# Patient Record
Sex: Female | Born: 1970
Health system: Southern US, Community
[De-identification: ages and names within clinical notes are randomized; demographics above are authoritative.]

## PROBLEM LIST (undated history)

## (undated) ENCOUNTER — Inpatient Hospital Stay (HOSPITAL_COMMUNITY): Payer: Self-pay

## (undated) DIAGNOSIS — K219 Gastro-esophageal reflux disease without esophagitis: Secondary | ICD-10-CM

## (undated) DIAGNOSIS — M722 Plantar fascial fibromatosis: Secondary | ICD-10-CM

## (undated) DIAGNOSIS — M2550 Pain in unspecified joint: Secondary | ICD-10-CM

## (undated) DIAGNOSIS — R7303 Prediabetes: Secondary | ICD-10-CM

## (undated) DIAGNOSIS — M549 Dorsalgia, unspecified: Secondary | ICD-10-CM

## (undated) DIAGNOSIS — N979 Female infertility, unspecified: Secondary | ICD-10-CM

## (undated) DIAGNOSIS — E079 Disorder of thyroid, unspecified: Secondary | ICD-10-CM

## (undated) HISTORY — DX: Disorder of thyroid, unspecified: E07.9

## (undated) HISTORY — DX: Prediabetes: R73.03

## (undated) HISTORY — DX: Female infertility, unspecified: N97.9

## (undated) HISTORY — DX: Plantar fascial fibromatosis: M72.2

## (undated) HISTORY — DX: Pain in unspecified joint: M25.50

## (undated) HISTORY — DX: Dorsalgia, unspecified: M54.9

## (undated) HISTORY — DX: Gastro-esophageal reflux disease without esophagitis: K21.9

---

## 2004-03-18 HISTORY — PX: KNEE ARTHROSCOPY: SUR90

## 2005-11-28 ENCOUNTER — Other Ambulatory Visit: Admission: RE | Admit: 2005-11-28 | Discharge: 2005-11-28 | Payer: Self-pay | Admitting: Obstetrics and Gynecology

## 2010-03-18 HISTORY — PX: HYSTEROSCOPY: SHX211

## 2010-03-18 HISTORY — PX: OTHER SURGICAL HISTORY: SHX169

## 2011-03-19 NOTE — L&D Delivery Note (Signed)
Delivery Note  SVD viable female Apgars 9,9 over 2nd degree ML epis.  Placenta delivered spontaneously intact with 3VC. Repair with 2-0 Chromic with good support and hemostasis noted and R/V exam confirms.  PH art was sent.  [place] cord blood was not available.  Mother and baby were doing well.  EBL 300cc  Candice Camp, MD

## 2011-03-20 LAB — OB RESULTS CONSOLE RUBELLA ANTIBODY, IGM: Rubella: IMMUNE

## 2011-03-20 LAB — OB RESULTS CONSOLE HEPATITIS B SURFACE ANTIGEN: Hepatitis B Surface Ag: NEGATIVE

## 2011-07-18 ENCOUNTER — Other Ambulatory Visit: Payer: Self-pay

## 2011-07-29 LAB — OB RESULTS CONSOLE ABO/RH: RH Type: NEGATIVE

## 2011-07-29 LAB — OB RESULTS CONSOLE ANTIBODY SCREEN: Antibody Screen: NEGATIVE

## 2011-07-29 LAB — OB RESULTS CONSOLE RPR: RPR: NONREACTIVE

## 2011-08-18 ENCOUNTER — Other Ambulatory Visit: Payer: Self-pay | Admitting: Obstetrics & Gynecology

## 2011-09-17 ENCOUNTER — Other Ambulatory Visit (HOSPITAL_COMMUNITY): Payer: Self-pay | Admitting: Obstetrics and Gynecology

## 2011-09-17 DIAGNOSIS — IMO0002 Reserved for concepts with insufficient information to code with codable children: Secondary | ICD-10-CM

## 2011-09-17 DIAGNOSIS — O09529 Supervision of elderly multigravida, unspecified trimester: Secondary | ICD-10-CM

## 2011-09-17 DIAGNOSIS — Z0489 Encounter for examination and observation for other specified reasons: Secondary | ICD-10-CM

## 2011-09-18 ENCOUNTER — Encounter (HOSPITAL_COMMUNITY): Payer: Self-pay

## 2011-09-18 ENCOUNTER — Ambulatory Visit (HOSPITAL_COMMUNITY)
Admission: RE | Admit: 2011-09-18 | Discharge: 2011-09-18 | Disposition: A | Discharge status: Home / Self Care | Payer: 59 | Source: Ambulatory Visit | Attending: Obstetrics and Gynecology | Admitting: Obstetrics and Gynecology

## 2011-09-18 VITALS — BP 115/76 | HR 90 | Wt 226.0 lb

## 2011-09-18 DIAGNOSIS — Z363 Encounter for antenatal screening for malformations: Secondary | ICD-10-CM | POA: Insufficient documentation

## 2011-09-18 DIAGNOSIS — O09519 Supervision of elderly primigravida, unspecified trimester: Secondary | ICD-10-CM | POA: Insufficient documentation

## 2011-09-18 DIAGNOSIS — IMO0002 Reserved for concepts with insufficient information to code with codable children: Secondary | ICD-10-CM

## 2011-09-18 DIAGNOSIS — O352XX0 Maternal care for (suspected) hereditary disease in fetus, not applicable or unspecified: Secondary | ICD-10-CM | POA: Insufficient documentation

## 2011-09-18 DIAGNOSIS — Z1389 Encounter for screening for other disorder: Secondary | ICD-10-CM | POA: Insufficient documentation

## 2011-09-18 DIAGNOSIS — O3440 Maternal care for other abnormalities of cervix, unspecified trimester: Secondary | ICD-10-CM | POA: Insufficient documentation

## 2011-09-18 DIAGNOSIS — O09529 Supervision of elderly multigravida, unspecified trimester: Secondary | ICD-10-CM

## 2011-09-18 DIAGNOSIS — Z0489 Encounter for examination and observation for other specified reasons: Secondary | ICD-10-CM

## 2011-09-18 DIAGNOSIS — O358XX0 Maternal care for other (suspected) fetal abnormality and damage, not applicable or unspecified: Secondary | ICD-10-CM | POA: Insufficient documentation

## 2011-09-18 NOTE — Progress Notes (Signed)
Patient seen today  for ultrasound appointment.  See full report in [facility].  Alpha Gula, MD  Single IUP at 17 4/7 weeks Normal detailed anatomic fetal survey; however, somewhat limited views of the fetal heart were obtained.   Normal amniotic fluid volume A marginal placenta cord insertion noted No markers associated with aneuploidy were seen  Recommend follow up ultrasound in 4 weeks for growth and to reevaluate the fetal heart.

## 2011-09-19 ENCOUNTER — Encounter (HOSPITAL_COMMUNITY): Payer: Self-pay

## 2011-09-19 ENCOUNTER — Inpatient Hospital Stay (HOSPITAL_COMMUNITY): Payer: 59

## 2011-09-19 ENCOUNTER — Inpatient Hospital Stay (HOSPITAL_COMMUNITY)
Admission: AD | Admit: 2011-09-19 | Discharge: 2011-09-19 | Disposition: A | Discharge status: Home / Self Care | Payer: 59 | Source: Ambulatory Visit | Attending: Obstetrics and Gynecology | Admitting: Obstetrics and Gynecology

## 2011-09-19 DIAGNOSIS — O209 Hemorrhage in early pregnancy, unspecified: Secondary | ICD-10-CM | POA: Insufficient documentation

## 2011-09-19 NOTE — MAU Provider Note (Signed)
  History     CSN: [id number]  Arrival date and time: 09/19/11 1954   None     Chief Complaint  Patient presents with  . Vaginal Bleeding  . Abdominal Pain   HPI This 41 year old G1 P0 at 17 weeks and 5 days who [facility] a [facility] who presents to the [facility]U with vaginal bleeding that occurred earlier this afternoon. The bleeding saturating 4-5 wads of toilet paper. The bleeding slightly improved with lying down for an hour. There are no provoking factors. She denies recent sexual activity, abdominal trauma, leaking fluid, vaginal discharge.  She has not felt fetal activity yet.  OB History    Grav Para Term Preterm Abortions TAB SAB Ect Mult Living   1    0  0         History reviewed. No pertinent past medical history.  Past Surgical History  Procedure Date  . Polpys   . Ivf      History reviewed. No pertinent family history.  History  Substance Use Topics  . Smoking status: Never Smoker   . Smokeless tobacco: Not on file  . Alcohol Use: No    Allergies: No Known Allergies  Prescriptions prior to admission  Medication Sig Dispense Refill  . Prenatal Vit-Fe Fumarate-FA (PRENATAL MULTIVITAMIN) TABS Take 1 tablet by mouth daily.        Review of Systems  All other systems reviewed and are negative.   Physical Exam   Blood pressure 121/71, pulse 72, temperature 98.2 F (36.8 C), temperature source Oral, resp. rate 18, height 5' 10.5" (1.791 m), weight 105.291 kg (232 lb 2 oz), last menstrual period 05/18/2011.  Physical Exam  Constitutional: She is oriented to person, place, and time. She appears well-developed and well-nourished.  GI: Soft. She exhibits no distension and no mass. There is tenderness (mild RLQ). There is no rebound and no guarding.  Neurological: She is alert and oriented to person, place, and time.  Skin: Skin is warm and dry.  Psychiatric: She has a normal mood and affect. Her behavior is normal. Judgment and thought content  normal.   LIMITED OBSTETRIC ULTRASOUND  Number of Fetuses: 1  Heart Rate: 156 bpm  Movement: Yes  Presentation: Cephalic  Placental Location: Posterior  Previa: No  Amniotic Fluid (Subjective): Normal  Vertical pocket: 5.4cm  MATERNAL FINDINGS:  Cervix: Closed. 4.5 cm in length.  Uterus/Adnexae: Within normal limits.  IMPRESSION:  17-week Intrauterine pregnancy with fetal heart rate 156 beats per  minute. Fetal measurements were deferred due to recent full OB  ultrasound performed 09/18/2011. Please refer to this study for  detail measurements and anatomy. No evidence of subchorionic  hemorrhage.  Recommend followup with non-emergent complete OB 14+ wk US  examination for fetal biometric evaluation and anatomic survey if  not already performed.  Original Report Authenticated By: Cyndie Chime, M.D.  MAU Course  Procedures  MDM  Assessment and Plan  1.  Vaginal Bleeding 2.  IUP at 17.5 weeks  No evidence of abruption or subchorionic hemorrhage.  Discussed pt with Dr Vincente Poli.  Patient discharged to home to follow up next week with [facility].  Patient instructed to return if symptoms worsen.  STINSON, JACOB JEHIEL 09/19/2011, 8:36 PM

## 2011-09-19 NOTE — MAU Note (Signed)
Patient is in with c/o sudden onset (1800) of bright red vaginal bleeding and mild abdominal cramping. She states that this is her 2nd ivf attempt, at [facility] she was told that everything looks good. She is tearful and worried that she may be having a miscarriage.

## 2011-10-18 ENCOUNTER — Ambulatory Visit (HOSPITAL_COMMUNITY)
Admission: RE | Admit: 2011-10-18 | Discharge: 2011-10-18 | Disposition: A | Discharge status: Home / Self Care | Payer: 59 | Source: Ambulatory Visit | Attending: Obstetrics and Gynecology | Admitting: Obstetrics and Gynecology

## 2011-10-18 DIAGNOSIS — Z0489 Encounter for examination and observation for other specified reasons: Secondary | ICD-10-CM

## 2011-10-18 DIAGNOSIS — O3440 Maternal care for other abnormalities of cervix, unspecified trimester: Secondary | ICD-10-CM | POA: Insufficient documentation

## 2011-10-18 DIAGNOSIS — O09519 Supervision of elderly primigravida, unspecified trimester: Secondary | ICD-10-CM | POA: Insufficient documentation

## 2011-10-18 DIAGNOSIS — O09529 Supervision of elderly multigravida, unspecified trimester: Secondary | ICD-10-CM

## 2011-10-18 DIAGNOSIS — O352XX0 Maternal care for (suspected) hereditary disease in fetus, not applicable or unspecified: Secondary | ICD-10-CM | POA: Insufficient documentation

## 2011-10-18 DIAGNOSIS — IMO0002 Reserved for concepts with insufficient information to code with codable children: Secondary | ICD-10-CM

## 2011-12-18 ENCOUNTER — Other Ambulatory Visit (HOSPITAL_COMMUNITY): Payer: Self-pay | Admitting: Obstetrics and Gynecology

## 2011-12-18 DIAGNOSIS — R1011 Right upper quadrant pain: Secondary | ICD-10-CM

## 2011-12-19 ENCOUNTER — Ambulatory Visit (HOSPITAL_COMMUNITY)
Admission: RE | Admit: 2011-12-19 | Discharge: 2011-12-19 | Disposition: A | Discharge status: Home / Self Care | Payer: 59 | Source: Ambulatory Visit | Attending: Obstetrics and Gynecology | Admitting: Obstetrics and Gynecology

## 2011-12-19 DIAGNOSIS — R1011 Right upper quadrant pain: Secondary | ICD-10-CM | POA: Insufficient documentation

## 2012-01-10 ENCOUNTER — Encounter (HOSPITAL_COMMUNITY): Payer: Self-pay | Admitting: *Deleted

## 2012-01-10 ENCOUNTER — Inpatient Hospital Stay (HOSPITAL_COMMUNITY): Payer: 59

## 2012-01-10 ENCOUNTER — Observation Stay (HOSPITAL_COMMUNITY)
Admission: AD | Admit: 2012-01-10 | Discharge: 2012-01-11 | DRG: 781 | Disposition: A | Discharge status: Home / Self Care | Payer: 59 | Source: Ambulatory Visit | Attending: Obstetrics and Gynecology | Admitting: Obstetrics and Gynecology

## 2012-01-10 DIAGNOSIS — W108XXA Fall (on) (from) other stairs and steps, initial encounter: Secondary | ICD-10-CM | POA: Diagnosis present

## 2012-01-10 DIAGNOSIS — O368390 Maternal care for abnormalities of the fetal heart rate or rhythm, unspecified trimester, not applicable or unspecified: Secondary | ICD-10-CM | POA: Diagnosis present

## 2012-01-10 DIAGNOSIS — O99891 Other specified diseases and conditions complicating pregnancy: Principal | ICD-10-CM | POA: Diagnosis present

## 2012-01-10 MED ORDER — ACETAMINOPHEN 325 MG PO TABS: 650.0000 mg | ORAL_TABLET | ORAL | Status: DC | PRN

## 2012-01-10 MED ORDER — DOCUSATE SODIUM 100 MG PO CAPS: 100.0000 mg | ORAL_CAPSULE | Freq: Every day | ORAL | Status: DC

## 2012-01-10 MED ORDER — ZOLPIDEM TARTRATE 5 MG PO TABS: 5.0000 mg | ORAL_TABLET | Freq: Every evening | ORAL | Status: DC | PRN

## 2012-01-10 MED ORDER — NIFEDIPINE 10 MG PO CAPS
10.0000 mg | ORAL_CAPSULE | Freq: Once | ORAL | Status: AC
  Administered 2012-01-10: 10 mg via ORAL
  Filled 2012-01-10: qty 1

## 2012-01-10 MED ORDER — PRENATAL MULTIVITAMIN CH: 1.0000 | ORAL_TABLET | Freq: Every day | ORAL | Status: DC

## 2012-01-10 MED ORDER — CALCIUM CARBONATE ANTACID 500 MG PO CHEW: 2.0000 | CHEWABLE_TABLET | ORAL | Status: DC | PRN

## 2012-01-10 NOTE — MAU Provider Note (Signed)
  History     CSN: [id number]  Arrival date and time: 01/10/12 1722   None     Chief Complaint  Patient presents with  . Fall   HPI  Pt is [redacted]w[redacted]d pregnant and fell down 3 steps on her abdomen.    No past medical history on file.  Past Surgical History  Procedure Date  . Polpys   . Ivf      No family history on file.  History  Substance Use Topics  . Smoking status: Never Smoker   . Smokeless tobacco: Not on file  . Alcohol Use: No    Allergies: No Known Allergies  Prescriptions prior to admission  Medication Sig Dispense Refill  . Prenatal Vit-Fe Fumarate-FA (PRENATAL MULTIVITAMIN) TABS Take 1 tablet by mouth daily.        ROS Physical Exam   Last menstrual period 05/18/2011.  Physical Exam  MAU Course  Procedures Dr. Renaldo Fiddler notified Care transferred to [facility], CNM  Assessment and Plan    LINEBERRY,SUSAN 01/10/2012, 5:40 PM   FHR 130 moderate variability, 15x15 accels, no decels Toco: Q2-4, min, mild  No evidence of abruption per [place].  23 hour Obs for contractions, abd trauma.  [place], CNM 01/10/2012 10:44 PM

## 2012-01-10 NOTE — Progress Notes (Signed)
Rn notified Dr. Renaldo Fiddler that patient was here after falling on ABD at home. RN notified MD of moderate variability , 15X15 accelerations, with no contractions. Orders given. Dr. Renaldo Fiddler requested that she be called after [place] and patient has been on fetal monitor for an hour.

## 2012-01-10 NOTE — MAU Note (Signed)
Fell at home, from 3 steps onto abdomen on brick flooring. At approximately 5:15.

## 2012-01-11 NOTE — Progress Notes (Signed)
Pt reports no ctx, vb or lof.  + Fm  AF, VSS FHT reassuring w/ accels Toco - occasional Gen - NAD Abd - gravid, NT  A/P:  Plan d/c home FU this week

## 2012-01-11 NOTE — H&P (Signed)
Chief Complaint   Patient presents with   .  Fall    HPI  Pt is [redacted]w[redacted]d pregnant and fell down 3 steps on her abdomen.  No past medical history on file.  Past Surgical History   Procedure  Date   .  Polpys    .  Ivf     No family history on file.  History   Substance Use Topics   .  Smoking status:  Never Smoker   .  Smokeless tobacco:  Not on file   .  Alcohol Use:  No    Allergies: No Known Allergies  Prescriptions prior to admission   Medication  Sig  Dispense  Refill   .  Prenatal Vit-Fe Fumarate-FA (PRENATAL MULTIVITAMIN) TABS  Take 1 tablet by mouth daily.      ROS  Physical Exam   Last menstrual period 05/18/2011.  Physical Exam  MAU Course   Procedures  Dr. Renaldo Fiddler notified  Care transferred to [facility], CNM   Pt returned from [place] where BPP 8/8 and AFI 14.  Placenta looked wnl.  Having some mild ctx Plan for procardia 10mg  po and overnight obs with fetal monitoring

## 2012-01-14 NOTE — Discharge Summary (Signed)
Obstetric Discharge Summary Reason for Admission: observation/evaluation Prenatal Procedures: ultrasound Intrapartum Procedures: ultrasound Postpartum Procedures: na Complications-Operative and Postpartum: na No results found for this basename: hgb, hct    Physical Exam:  General: alert and cooperative Lochia: na Uterine Fundus: gravid,soft Incision: na DVT Evaluation: No evidence of DVT seen on physical exam.  Discharge Diagnoses: 33 weeks, s/p fall  Discharge Information: Date: 01/14/2012 Activity: pelvic rest Diet: routine Medications: PNV Condition: stable Instructions: call for uterine contractions, decreased fetal movement, LOF of VB Discharge to: home Follow-up Information    Schedule an appointment as soon as possible for a visit in 1 week to follow up.         Newborn Data: This patient has no babies on file. Home with na.  Cassie [name] 01/14/2012, 8:26 AM

## 2012-01-15 NOTE — Progress Notes (Signed)
Post ur discharge review completed for dates of 01-10-12 through 01-11-12.

## 2012-02-22 ENCOUNTER — Encounter (HOSPITAL_COMMUNITY): Payer: Self-pay | Admitting: Anesthesiology

## 2012-02-22 ENCOUNTER — Inpatient Hospital Stay (HOSPITAL_COMMUNITY): Payer: 59 | Admitting: Anesthesiology

## 2012-02-22 ENCOUNTER — Encounter (HOSPITAL_COMMUNITY): Payer: Self-pay | Admitting: *Deleted

## 2012-02-22 ENCOUNTER — Inpatient Hospital Stay (HOSPITAL_COMMUNITY)
Admission: AD | Admit: 2012-02-22 | Discharge: 2012-02-24 | DRG: 775 | Disposition: A | Discharge status: Home / Self Care | Payer: 59 | Source: Ambulatory Visit | Attending: Obstetrics and Gynecology | Admitting: Obstetrics and Gynecology

## 2012-02-22 DIAGNOSIS — O4290 Premature rupture of membranes, unspecified as to length of time between rupture and onset of labor, unspecified weeks of gestation: Secondary | ICD-10-CM | POA: Diagnosis present

## 2012-02-22 DIAGNOSIS — O09519 Supervision of elderly primigravida, unspecified trimester: Secondary | ICD-10-CM | POA: Diagnosis present

## 2012-02-22 LAB — CBC
HCT: 32.8 % — ABNORMAL LOW (ref 36.0–46.0)
Hemoglobin: 11.2 g/dL — ABNORMAL LOW (ref 12.0–15.0)
MCH: 32.3 pg (ref 26.0–34.0)
RBC: 3.47 MIL/uL — ABNORMAL LOW (ref 3.87–5.11)

## 2012-02-22 LAB — RPR: RPR Ser Ql: NONREACTIVE

## 2012-02-22 MED ORDER — LIDOCAINE HCL (PF) 1 % IJ SOLN
INTRAMUSCULAR | Status: DC | PRN
  Administered 2012-02-22: 5 mL
  Administered 2012-02-22: 4 mL

## 2012-02-22 MED ORDER — OXYTOCIN 40 UNITS IN LACTATED RINGERS INFUSION - SIMPLE MED: 1.0000 m[IU]/min | INTRAVENOUS | Status: DC

## 2012-02-22 MED ORDER — WITCH HAZEL-GLYCERIN EX PADS: 1.0000 "application " | MEDICATED_PAD | CUTANEOUS | Status: DC | PRN

## 2012-02-22 MED ORDER — PHENYLEPHRINE 40 MCG/ML (10ML) SYRINGE FOR IV PUSH (FOR BLOOD PRESSURE SUPPORT)
80.0000 ug | PREFILLED_SYRINGE | INTRAVENOUS | Status: DC | PRN
  Filled 2012-02-22: qty 5

## 2012-02-22 MED ORDER — LIDOCAINE HCL (PF) 1 % IJ SOLN
30.0000 mL | INTRAMUSCULAR | Status: DC | PRN
  Filled 2012-02-22: qty 30

## 2012-02-22 MED ORDER — LACTATED RINGERS IV SOLN
INTRAVENOUS | Status: DC
  Administered 2012-02-22: 125 mL/h via INTRAVENOUS
  Administered 2012-02-22 (×2): via INTRAVENOUS

## 2012-02-22 MED ORDER — IBUPROFEN 600 MG PO TABS
600.0000 mg | ORAL_TABLET | Freq: Four times a day (QID) | ORAL | Status: DC | PRN
  Administered 2012-02-22: 600 mg via ORAL
  Filled 2012-02-22: qty 1

## 2012-02-22 MED ORDER — FENTANYL 2.5 MCG/ML BUPIVACAINE 1/10 % EPIDURAL INFUSION (WH - ANES)
INTRAMUSCULAR | Status: DC | PRN
  Administered 2012-02-22: 15 mL/h via EPIDURAL

## 2012-02-22 MED ORDER — DIPHENHYDRAMINE HCL 25 MG PO CAPS: 25.0000 mg | ORAL_CAPSULE | Freq: Four times a day (QID) | ORAL | Status: DC | PRN

## 2012-02-22 MED ORDER — SENNOSIDES-DOCUSATE SODIUM 8.6-50 MG PO TABS
2.0000 | ORAL_TABLET | Freq: Every day | ORAL | Status: DC
  Administered 2012-02-23: 2 via ORAL

## 2012-02-22 MED ORDER — DIPHENHYDRAMINE HCL 50 MG/ML IJ SOLN: 12.5000 mg | INTRAMUSCULAR | Status: DC | PRN

## 2012-02-22 MED ORDER — SIMETHICONE 80 MG PO CHEW
80.0000 mg | CHEWABLE_TABLET | ORAL | Status: DC | PRN
Start: 2012-02-22 — End: 2012-02-24

## 2012-02-22 MED ORDER — IBUPROFEN 600 MG PO TABS
600.0000 mg | ORAL_TABLET | Freq: Four times a day (QID) | ORAL | Status: DC
  Administered 2012-02-23 – 2012-02-24 (×5): 600 mg via ORAL
  Filled 2012-02-22 (×5): qty 1

## 2012-02-22 MED ORDER — OXYCODONE-ACETAMINOPHEN 5-325 MG PO TABS: 1.0000 | ORAL_TABLET | ORAL | Status: DC | PRN

## 2012-02-22 MED ORDER — CITRIC ACID-SODIUM CITRATE 334-500 MG/5ML PO SOLN
30.0000 mL | ORAL | Status: DC | PRN
  Administered 2012-02-22: 30 mL via ORAL
  Filled 2012-02-22: qty 15

## 2012-02-22 MED ORDER — TETANUS-DIPHTH-ACELL PERTUSSIS 5-2.5-18.5 LF-MCG/0.5 IM SUSP
0.5000 mL | Freq: Once | INTRAMUSCULAR | Status: DC
Start: 2012-02-23 — End: 2012-02-23

## 2012-02-22 MED ORDER — BENZOCAINE-MENTHOL 20-0.5 % EX AERO
1.0000 "application " | INHALATION_SPRAY | CUTANEOUS | Status: DC | PRN
  Filled 2012-02-22: qty 56

## 2012-02-22 MED ORDER — EPHEDRINE 5 MG/ML INJ: 10.0000 mg | INTRAVENOUS | Status: DC | PRN

## 2012-02-22 MED ORDER — EPHEDRINE 5 MG/ML INJ
10.0000 mg | INTRAVENOUS | Status: DC | PRN
  Filled 2012-02-22: qty 4

## 2012-02-22 MED ORDER — MEASLES, MUMPS & RUBELLA VAC ~~LOC~~ INJ
0.5000 mL | INJECTION | Freq: Once | SUBCUTANEOUS | Status: DC
  Filled 2012-02-22: qty 0.5

## 2012-02-22 MED ORDER — OXYTOCIN 40 UNITS IN LACTATED RINGERS INFUSION - SIMPLE MED
1.0000 m[IU]/min | INTRAVENOUS | Status: DC
  Administered 2012-02-22: 2 m[IU]/min via INTRAVENOUS

## 2012-02-22 MED ORDER — PRENATAL MULTIVITAMIN CH
1.0000 | ORAL_TABLET | Freq: Every day | ORAL | Status: DC
  Filled 2012-02-22 (×2): qty 1

## 2012-02-22 MED ORDER — OXYTOCIN BOLUS FROM INFUSION
500.0000 mL | INTRAVENOUS | Status: DC
  Administered 2012-02-22: 500 mL via INTRAVENOUS

## 2012-02-22 MED ORDER — LANOLIN HYDROUS EX OINT: TOPICAL_OINTMENT | CUTANEOUS | Status: DC | PRN

## 2012-02-22 MED ORDER — PRENATAL MULTIVITAMIN CH: 1.0000 | ORAL_TABLET | Freq: Every day | ORAL | Status: DC

## 2012-02-22 MED ORDER — LACTATED RINGERS IV SOLN
500.0000 mL | Freq: Once | INTRAVENOUS | Status: AC
  Administered 2012-02-22: 500 mL via INTRAVENOUS

## 2012-02-22 MED ORDER — PHENYLEPHRINE 40 MCG/ML (10ML) SYRINGE FOR IV PUSH (FOR BLOOD PRESSURE SUPPORT): 80.0000 ug | PREFILLED_SYRINGE | INTRAVENOUS | Status: DC | PRN

## 2012-02-22 MED ORDER — FLEET ENEMA 7-19 GM/118ML RE ENEM: 1.0000 | ENEMA | RECTAL | Status: DC | PRN

## 2012-02-22 MED ORDER — LACTATED RINGERS IV SOLN
500.0000 mL | INTRAVENOUS | Status: DC | PRN
  Administered 2012-02-22 (×3): 500 mL via INTRAVENOUS

## 2012-02-22 MED ORDER — OXYTOCIN 40 UNITS IN LACTATED RINGERS INFUSION - SIMPLE MED
62.5000 mL/h | INTRAVENOUS | Status: DC
  Administered 2012-02-22: 62.5 mL/h via INTRAVENOUS
  Filled 2012-02-22: qty 1000

## 2012-02-22 MED ORDER — ONDANSETRON HCL 4 MG/2ML IJ SOLN: 4.0000 mg | Freq: Four times a day (QID) | INTRAMUSCULAR | Status: DC | PRN

## 2012-02-22 MED ORDER — ZOLPIDEM TARTRATE 5 MG PO TABS: 5.0000 mg | ORAL_TABLET | Freq: Every evening | ORAL | Status: DC | PRN

## 2012-02-22 MED ORDER — FENTANYL 2.5 MCG/ML BUPIVACAINE 1/10 % EPIDURAL INFUSION (WH - ANES)
14.0000 mL/h | INTRAMUSCULAR | Status: DC
  Administered 2012-02-22: 15 mL/h via EPIDURAL
  Filled 2012-02-22 (×2): qty 125

## 2012-02-22 MED ORDER — ONDANSETRON HCL 4 MG/2ML IJ SOLN: 4.0000 mg | INTRAMUSCULAR | Status: DC | PRN

## 2012-02-22 MED ORDER — TERBUTALINE SULFATE 1 MG/ML IJ SOLN: 0.2500 mg | Freq: Once | INTRAMUSCULAR | Status: DC | PRN

## 2012-02-22 MED ORDER — ACETAMINOPHEN 325 MG PO TABS: 650.0000 mg | ORAL_TABLET | ORAL | Status: DC | PRN

## 2012-02-22 MED ORDER — ONDANSETRON HCL 4 MG PO TABS: 4.0000 mg | ORAL_TABLET | ORAL | Status: DC | PRN

## 2012-02-22 MED ORDER — DIBUCAINE 1 % RE OINT: 1.0000 "application " | TOPICAL_OINTMENT | RECTAL | Status: DC | PRN

## 2012-02-22 MED ORDER — MEDROXYPROGESTERONE ACETATE 150 MG/ML IM SUSP: 150.0000 mg | INTRAMUSCULAR | Status: DC | PRN

## 2012-02-22 NOTE — H&P (Signed)
Cassie [name] is a 41 y.o. female presenting for srom at midnight clear fluid.  GBS-.  IVF pregnancy with normal AMA workup and no complications. History OB History    Grav Para Term Preterm Abortions TAB SAB Ect Mult Living   1    0  0        History reviewed. No pertinent past medical history. Past Surgical History  Procedure Date  . Polpys   . Ivf    . Abdominal hysterectomy    Family History: family history is not on file. Social History:  reports that she has never smoked. She does not have any smokeless tobacco history on file. She reports that she does not drink alcohol or use illicit drugs.   Prenatal Transfer Tool  Maternal Diabetes: No Genetic Screening: Normal Maternal Ultrasounds/Referrals: Normal Fetal Ultrasounds or other Referrals:  None Maternal Substance Abuse:  No Significant Maternal Medications:  None Significant Maternal Lab Results:  None Other Comments:  None  ROS  Dilation: Fingertip Effacement (%): Thick Station: -3 Exam by:: [email or web address]-[email or web address], rnc Blood pressure 122/70, pulse 63, temperature 98.4 F (36.9 C), temperature source Oral, resp. rate 18, height 5\' 11"  (1.803 m), weight 110.678 kg (244 lb), last menstrual period 05/18/2011, SpO2 98.00%. Exam Physical Exam  Prenatal labs: ABO, Rh: O/Negative/-- (05/13 0000) Antibody: Negative (05/13 0000) Rubella: Immune (01/02 0000) RPR: NON REACTIVE (12/07 0116)  HBsAg: Negative (01/02 0000)  HIV: Non-reactive (05/13 0000)  GBS: Negative (11/08 0000)   Assessment/Plan: IUP at term PROM/SROM- Augmentation with pitocin.  Pt with epidural  Anticipate SVD   Cassie [name] C 02/22/2012, 8:41 AM

## 2012-02-22 NOTE — Anesthesia Preprocedure Evaluation (Addendum)
Anesthesia Evaluation  Patient identified by MRN, date of birth, ID band Patient awake    Reviewed: Allergy & Precautions, H&P , NPO status , Patient's Chart, lab work & pertinent test results  Airway Mallampati: III TM Distance: >3 FB Neck ROM: full    Dental No notable dental hx. (+) Teeth Intact   Pulmonary neg pulmonary ROS,    Pulmonary exam normal       Cardiovascular negative cardio ROS  Rhythm:regular     Neuro/Psych negative neurological ROS  negative psych ROS   GI/Hepatic negative GI ROS, Neg liver ROS,   Endo/Other  negative endocrine ROS  Renal/GU negative Renal ROS  negative genitourinary   Musculoskeletal negative musculoskeletal ROS (+)   Abdominal Normal abdominal exam  (+)   Peds  Hematology negative hematology ROS (+)   Anesthesia Other Findings   Reproductive/Obstetrics (+) Pregnancy                           Anesthesia Physical Anesthesia Plan  ASA: II  Anesthesia Plan: Epidural   Post-op Pain Management:    Induction:   Airway Management Planned:   Additional Equipment:   Intra-op Plan:   Post-operative Plan:   Informed Consent:   Plan Discussed with: Anesthesiologist, CRNA and Surgeon  Anesthesia Plan Comments:        Anesthesia Quick Evaluation

## 2012-02-22 NOTE — MAU Note (Signed)
Think my water broke at 2315. Some contractions

## 2012-02-22 NOTE — Progress Notes (Signed)
RN updated Dr. Rana Snare at this time. Notified him of minimal variability, late, prolonged, and variable decels as well as recent vaginal exam.

## 2012-02-22 NOTE — Progress Notes (Signed)
Report called to [facility] in [place]. To 164 ambulatory

## 2012-02-22 NOTE — Anesthesia Procedure Notes (Signed)
Epidural Patient location during procedure: OB Start time: 02/22/2012 8:09 AM  Staffing Anesthesiologist: Ayomide Purdy A. Performed by: anesthesiologist   Preanesthetic Checklist Completed: patient identified, site marked, surgical consent, pre-op evaluation, timeout performed, IV checked, risks and benefits discussed and monitors and equipment checked  Epidural Patient position: sitting Prep: site prepped and draped and DuraPrep Patient monitoring: continuous pulse ox and blood pressure Approach: midline Injection technique: LOR air  Needle:  Needle type: Tuohy  Needle gauge: 17 G Needle length: 9 cm and 9 Needle insertion depth: 7 cm Catheter type: closed end flexible Catheter size: 19 Gauge Catheter at skin depth: 12 cm Test dose: negative and Other  Assessment Events: blood not aspirated, injection not painful, no injection resistance, negative IV test and no paresthesia  Additional Notes Patient identified. Risks and benefits discussed including failed block, incomplete  Pain control, post dural puncture headache, nerve damage, paralysis, blood pressure Changes, nausea, vomiting, reactions to medications-both toxic and allergic and post Partum back pain. All questions were answered. Patient expressed understanding and wished to proceed. Sterile technique was used throughout procedure. Epidural site was Dressed with sterile barrier dressing. No paresthesias, signs of intravascular injection Or signs of intrathecal spread were encountered.  Patient was more comfortable after the epidural was dosed. Please see RN's note for documentation of vital signs and FHR which are stable.

## 2012-02-23 LAB — CBC
HCT: 32 % — ABNORMAL LOW (ref 36.0–46.0)
Hemoglobin: 10.7 g/dL — ABNORMAL LOW (ref 12.0–15.0)
MCH: 31.7 pg (ref 26.0–34.0)
MCHC: 33.4 g/dL (ref 30.0–36.0)
MCV: 94.7 fL (ref 78.0–100.0)
RDW: 14.2 % (ref 11.5–15.5)

## 2012-02-23 MED ORDER — RHO D IMMUNE GLOBULIN 1500 UNIT/2ML IJ SOLN
300.0000 ug | Freq: Once | INTRAMUSCULAR | Status: AC
  Administered 2012-02-23: 300 ug via INTRAMUSCULAR
  Filled 2012-02-23: qty 2

## 2012-02-23 NOTE — Anesthesia Postprocedure Evaluation (Signed)
  Anesthesia Post-op Note  Patient: Cassie [name]  Procedure(s) Performed: * No procedures listed *  Patient Location: Mother/Baby  Anesthesia Type:Epidural  Level of Consciousness: awake, alert  and oriented  Airway and Oxygen Therapy: Patient Spontanous Breathing  Post-op Pain: none  Post-op Assessment: Post-op Vital signs reviewed, Patient's Cardiovascular Status Stable, Respiratory Function Stable, Patent Airway, No signs of Nausea or vomiting, Pain level controlled, No headache, No backache and No residual motor weakness. She has some numbness over the distribution of the right lateral femoral cutaneous nerve and some mild numbness over the superior aspect of left knee.  Post-op Vital Signs: Reviewed and stable  Complications: No apparent anesthesia complications

## 2012-02-23 NOTE — Progress Notes (Signed)
Post Partum Day 1 Subjective: no complaints, up ad lib, voiding and tolerating PO  Objective: Blood pressure 116/71, pulse 60, temperature 98 F (36.7 C), temperature source Oral, resp. rate 20, height 5\' 11"  (1.803 m), weight 110.678 kg (244 lb), last menstrual period 05/18/2011, SpO2 96.00%, unknown if currently breastfeeding.  Physical Exam:  General: alert, cooperative, appears older than stated age and no distress Lochia: appropriate Uterine Fundus: firm Incision: healing well DVT Evaluation: No evidence of DVT seen on physical exam.   Basename 02/23/12 0515 02/22/12 0116  HGB 10.7* 11.2*  HCT 32.0* 32.8*    Assessment/Plan: Plan for discharge tomorrow and Breastfeeding Some right lateral thigh numbness but no other complaints.  If it persists then Anesthesia consult    LOS: 1 day   York Valliant C 02/23/2012, 10:43 AM

## 2012-02-24 LAB — RH IG WORKUP (INCLUDES ABO/RH): Unit division: 0

## 2012-02-24 NOTE — Discharge Summary (Signed)
Obstetric Discharge Summary Reason for Admission: rupture of membranes Prenatal Procedures: ultrasound Intrapartum Procedures: spontaneous vaginal delivery Postpartum Procedures: none Complications-Operative and Postpartum: 2 degree perineal laceration Hemoglobin  Date Value Range Status  02/23/2012 10.7* 12.0 - 15.0 g/dL Final     HCT  Date Value Range Status  02/23/2012 32.0* 36.0 - 46.0 % Final    Physical Exam:  General: alert and cooperative Lochia: appropriate Uterine Fundus: firm Incision: perineum intact DVT Evaluation: No evidence of DVT seen on physical exam. Negative Homan's sign. No cords or calf tenderness. R lateral thigh numbness resolving, no difficulty with ambulation.   Discharge Diagnoses: Term Pregnancy-delivered  Discharge Information: Date: 02/24/2012 Activity: pelvic rest Diet: routine Medications: PNV and Ibuprofen Condition: stable Instructions: refer to practice specific booklet Discharge to: home   Newborn Data: Live born female  Birth Weight: 7 lb 9.2 oz (3436 g) APGAR: 9, 9  Home with mother.  Cassie [name] G 02/24/2012, 8:22 AM

## 2012-02-25 NOTE — Addendum Note (Signed)
Addendum  created 02/25/12 0801 by Dana Allan, MD   Modules edited:Charting, Inpatient Notes

## 2012-05-26 ENCOUNTER — Other Ambulatory Visit: Payer: Self-pay | Admitting: Obstetrics and Gynecology

## 2013-11-15 ENCOUNTER — Other Ambulatory Visit (INDEPENDENT_AMBULATORY_CARE_PROVIDER_SITE_OTHER): Payer: 59

## 2013-11-15 ENCOUNTER — Other Ambulatory Visit: Payer: Self-pay | Admitting: Obstetrics and Gynecology

## 2013-11-15 DIAGNOSIS — K921 Melena: Secondary | ICD-10-CM

## 2013-11-15 DIAGNOSIS — Z0000 Encounter for general adult medical examination without abnormal findings: Secondary | ICD-10-CM

## 2013-11-15 LAB — COMPREHENSIVE METABOLIC PANEL
ALBUMIN: 4 g/dL (ref 3.5–5.2)
ALT: 13 U/L (ref 0–35)
AST: 14 U/L (ref 0–37)
Alkaline Phosphatase: 78 U/L (ref 39–117)
BUN: 11 mg/dL (ref 6–23)
CALCIUM: 9.1 mg/dL (ref 8.4–10.5)
CHLORIDE: 102 meq/L (ref 96–112)
CO2: 27 mEq/L (ref 19–32)
CREATININE: 0.74 mg/dL (ref 0.50–1.10)
GLUCOSE: 93 mg/dL (ref 70–99)
POTASSIUM: 4 meq/L (ref 3.5–5.3)
Sodium: 137 mEq/L (ref 135–145)
Total Bilirubin: 0.5 mg/dL (ref 0.2–1.2)
Total Protein: 6.7 g/dL (ref 6.0–8.3)

## 2013-11-15 LAB — LIPID PANEL
CHOLESTEROL: 135 mg/dL (ref 0–200)
HDL: 42 mg/dL (ref >39)
LDL CALC: 81 mg/dL (ref 0–99)
TRIGLYCERIDES: 59 mg/dL (ref <150)
Total CHOL/HDL Ratio: 3.2 Ratio
VLDL: 12 mg/dL (ref 0–40)

## 2013-11-15 LAB — TSH: TSH: 0.7 u[IU]/mL (ref 0.350–4.500)

## 2013-11-16 LAB — CBC WITH DIFFERENTIAL/PLATELET
BASOS ABS: 0.1 10*3/uL (ref 0.0–0.1)
BASOS PCT: 1 % (ref 0–1)
EOS PCT: 1 % (ref 0–5)
Eosinophils Absolute: 0.1 10*3/uL (ref 0.0–0.7)
HEMATOCRIT: 40.5 % (ref 36.0–46.0)
HEMOGLOBIN: 13.5 g/dL (ref 12.0–15.0)
LYMPHS PCT: 39 % (ref 12–46)
Lymphs Abs: 2.8 10*3/uL (ref 0.7–4.0)
MCH: 30.8 pg (ref 26.0–34.0)
MCHC: 33.3 g/dL (ref 30.0–36.0)
MCV: 92.5 fL (ref 78.0–100.0)
MONO ABS: 0.4 10*3/uL (ref 0.1–1.0)
MONOS PCT: 6 % (ref 3–12)
Neutro Abs: 3.8 10*3/uL (ref 1.7–7.7)
Neutrophils Relative %: 53 % (ref 43–77)
Platelets: 272 10*3/uL (ref 150–400)
RBC: 4.38 MIL/uL (ref 3.87–5.11)
RDW: 13.9 % (ref 11.5–15.5)
WBC: 7.2 10*3/uL (ref 4.0–10.5)

## 2014-01-17 ENCOUNTER — Encounter (HOSPITAL_COMMUNITY): Payer: Self-pay | Admitting: *Deleted

## 2014-02-07 ENCOUNTER — Other Ambulatory Visit: Payer: Self-pay | Admitting: Certified Nurse Midwife

## 2014-02-07 LAB — CBC WITH DIFFERENTIAL/PLATELET
BASOS ABS: 0 10*3/uL (ref 0.0–0.1)
BASOS PCT: 0 % (ref 0–1)
EOS ABS: 0 10*3/uL (ref 0.0–0.7)
EOS PCT: 0 % (ref 0–5)
HEMATOCRIT: 38.6 % (ref 36.0–46.0)
HEMOGLOBIN: 13.3 g/dL (ref 12.0–15.0)
Lymphocytes Relative: 25 % (ref 12–46)
Lymphs Abs: 2.5 10*3/uL (ref 0.7–4.0)
MCH: 32 pg (ref 26.0–34.0)
MCHC: 34.5 g/dL (ref 30.0–36.0)
MCV: 92.8 fL (ref 78.0–100.0)
MONO ABS: 0.7 10*3/uL (ref 0.1–1.0)
MONOS PCT: 7 % (ref 3–12)
NEUTROS ABS: 6.7 10*3/uL (ref 1.7–7.7)
Neutrophils Relative %: 68 % (ref 43–77)
Platelets: 225 10*3/uL (ref 150–400)
RBC: 4.16 MIL/uL (ref 3.87–5.11)
RDW: 13 % (ref 11.5–15.5)
WBC: 9.9 10*3/uL (ref 4.0–10.5)

## 2014-02-09 ENCOUNTER — Other Ambulatory Visit: Payer: Self-pay | Admitting: Internal Medicine

## 2014-02-09 ENCOUNTER — Other Ambulatory Visit: Payer: Self-pay | Admitting: *Deleted

## 2014-02-09 DIAGNOSIS — E041 Nontoxic single thyroid nodule: Secondary | ICD-10-CM

## 2014-02-15 ENCOUNTER — Other Ambulatory Visit (HOSPITAL_COMMUNITY)
Admission: RE | Admit: 2014-02-15 | Discharge: 2014-02-15 | Disposition: A | Discharge status: Home / Self Care | Payer: 59 | Source: Ambulatory Visit | Attending: Interventional Radiology | Admitting: Interventional Radiology

## 2014-02-15 ENCOUNTER — Ambulatory Visit
Admission: RE | Admit: 2014-02-15 | Discharge: 2014-02-15 | Disposition: A | Discharge status: Home / Self Care | Payer: 59 | Source: Ambulatory Visit | Attending: Internal Medicine | Admitting: Internal Medicine

## 2014-02-15 DIAGNOSIS — E041 Nontoxic single thyroid nodule: Secondary | ICD-10-CM

## 2014-05-17 ENCOUNTER — Other Ambulatory Visit: Payer: Self-pay | Admitting: Internal Medicine

## 2014-05-17 DIAGNOSIS — E049 Nontoxic goiter, unspecified: Secondary | ICD-10-CM

## 2014-06-29 ENCOUNTER — Encounter: Payer: Self-pay | Admitting: Certified Nurse Midwife

## 2014-06-29 ENCOUNTER — Ambulatory Visit (INDEPENDENT_AMBULATORY_CARE_PROVIDER_SITE_OTHER): Payer: 59 | Admitting: Certified Nurse Midwife

## 2014-06-29 VITALS — BP 108/72 | HR 72 | Resp 16 | Ht 69.75 in

## 2014-06-29 DIAGNOSIS — N946 Dysmenorrhea, unspecified: Secondary | ICD-10-CM

## 2014-06-29 DIAGNOSIS — Z0000 Encounter for general adult medical examination without abnormal findings: Secondary | ICD-10-CM

## 2014-06-29 DIAGNOSIS — Z01419 Encounter for gynecological examination (general) (routine) without abnormal findings: Secondary | ICD-10-CM | POA: Diagnosis not present

## 2014-06-29 DIAGNOSIS — Z124 Encounter for screening for malignant neoplasm of cervix: Secondary | ICD-10-CM | POA: Diagnosis not present

## 2014-06-29 DIAGNOSIS — N920 Excessive and frequent menstruation with regular cycle: Secondary | ICD-10-CM | POA: Diagnosis not present

## 2014-06-29 LAB — THYROID PANEL WITH TSH
FREE THYROXINE INDEX: 1.6 (ref 1.4–3.8)
T3 UPTAKE: 30 % (ref 22–35)
T4, Total: 5.3 ug/dL (ref 4.5–12.0)
TSH: 0.552 u[IU]/mL (ref 0.350–4.500)

## 2014-06-29 LAB — COMPREHENSIVE METABOLIC PANEL
ALBUMIN: 3.8 g/dL (ref 3.5–5.2)
ALK PHOS: 67 U/L (ref 39–117)
ALT: 10 U/L (ref 0–35)
AST: 14 U/L (ref 0–37)
BILIRUBIN TOTAL: 0.7 mg/dL (ref 0.2–1.2)
BUN: 11 mg/dL (ref 6–23)
CALCIUM: 8.8 mg/dL (ref 8.4–10.5)
CO2: 23 mEq/L (ref 19–32)
Chloride: 105 mEq/L (ref 96–112)
Creat: 0.6 mg/dL (ref 0.50–1.10)
GLUCOSE: 93 mg/dL (ref 70–99)
Potassium: 4.1 mEq/L (ref 3.5–5.3)
SODIUM: 138 meq/L (ref 135–145)
TOTAL PROTEIN: 6.7 g/dL (ref 6.0–8.3)

## 2014-06-29 LAB — CBC WITH DIFFERENTIAL/PLATELET
BASOS PCT: 0 % (ref 0–1)
Basophils Absolute: 0 10*3/uL (ref 0.0–0.1)
EOS ABS: 0.1 10*3/uL (ref 0.0–0.7)
EOS PCT: 1 % (ref 0–5)
HCT: 39.2 % (ref 36.0–46.0)
Hemoglobin: 13.3 g/dL (ref 12.0–15.0)
Lymphocytes Relative: 34 % (ref 12–46)
Lymphs Abs: 2.9 10*3/uL (ref 0.7–4.0)
MCH: 31.3 pg (ref 26.0–34.0)
MCHC: 33.9 g/dL (ref 30.0–36.0)
MCV: 92.2 fL (ref 78.0–100.0)
MPV: 10.6 fL (ref 8.6–12.4)
Monocytes Absolute: 0.7 10*3/uL (ref 0.1–1.0)
Monocytes Relative: 8 % (ref 3–12)
NEUTROS PCT: 57 % (ref 43–77)
Neutro Abs: 4.9 10*3/uL (ref 1.7–7.7)
Platelets: 260 10*3/uL (ref 150–400)
RBC: 4.25 MIL/uL (ref 3.87–5.11)
RDW: 13.9 % (ref 11.5–15.5)
WBC: 8.6 10*3/uL (ref 4.0–10.5)

## 2014-06-29 LAB — LIPID PANEL
CHOL/HDL RATIO: 3.2 ratio
Cholesterol: 154 mg/dL (ref 0–200)
HDL: 48 mg/dL (ref >=46)
LDL Cholesterol: 93 mg/dL (ref 0–99)
TRIGLYCERIDES: 65 mg/dL (ref <150)
VLDL: 13 mg/dL (ref 0–40)

## 2014-06-29 LAB — HEMOGLOBIN A1C
HEMOGLOBIN A1C: 5.8 % — AB (ref <5.7)
Mean Plasma Glucose: 120 mg/dL — ABNORMAL HIGH (ref <117)

## 2014-06-29 LAB — POCT URINALYSIS DIPSTICK
Bilirubin, UA: NEGATIVE
GLUCOSE UA: NEGATIVE
Ketones, UA: NEGATIVE
Leukocytes, UA: NEGATIVE
NITRITE UA: NEGATIVE
PH UA: 5
PROTEIN UA: NEGATIVE
RBC UA: NEGATIVE
UROBILINOGEN UA: NEGATIVE

## 2014-06-29 MED ORDER — TRANEXAMIC ACID 650 MG PO TABS: 1300.0000 mg | ORAL_TABLET | Freq: Three times a day (TID) | ORAL | Status: DC

## 2014-06-29 NOTE — Patient Instructions (Signed)
EXERCISE AND DIET:  We recommended that you start or continue a regular exercise program for good health. Regular exercise means any activity that makes your heart beat faster and makes you sweat.  We recommend exercising at least 30 minutes per day at least 3 days a week, preferably 4 or 5.  We also recommend a diet low in fat and sugar.  Inactivity, poor dietary choices and obesity can cause diabetes, heart attack, stroke, and kidney damage, among others.    ALCOHOL AND SMOKING:  Women should limit their alcohol intake to no more than 7 drinks/beers/glasses of wine (combined, not each!) per week. Moderation of alcohol intake to this level decreases your risk of breast cancer and liver damage. And of course, no recreational drugs are part of a healthy lifestyle.  And absolutely no smoking or even second hand smoke. Most people know smoking can cause heart and lung diseases, but did you know it also contributes to weakening of your bones? Aging of your skin?  Yellowing of your teeth and nails?  CALCIUM AND VITAMIN D:  Adequate intake of calcium and Vitamin D are recommended.  The recommendations for exact amounts of these supplements seem to change often, but generally speaking 600 mg of calcium (either carbonate or citrate) and 800 units of Vitamin D per day seems prudent. Certain women may benefit from higher intake of Vitamin D.  If you are among these women, your doctor will have told you during your visit.    PAP SMEARS:  Pap smears, to check for cervical cancer or precancers,  have traditionally been done yearly, although recent scientific advances have shown that most women can have pap smears less often.  However, every woman still should have a physical exam from her gynecologist every year. It will include a breast check, inspection of the vulva and vagina to check for abnormal growths or skin changes, a visual exam of the cervix, and then an exam to evaluate the size and shape of the uterus and  ovaries.  And after 43 years of age, a rectal exam is indicated to check for rectal cancers. We will also provide age appropriate advice regarding health maintenance, like when you should have certain vaccines, screening for sexually transmitted diseases, bone density testing, colonoscopy, mammograms, etc.   MAMMOGRAMS:  All women over 40 years old should have a yearly mammogram. Many facilities now offer a "3D" mammogram, which may cost around $50 extra out of pocket. If possible,  we recommend you accept the option to have the 3D mammogram performed.  It both reduces the number of women who will be called back for extra views which then turn out to be normal, and it is better than the routine mammogram at detecting truly abnormal areas.    COLONOSCOPY:  Colonoscopy to screen for colon cancer is recommended for all women at age 50.  We know, you hate the idea of the prep.  We agree, BUT, having colon cancer and not knowing it is worse!!  Colon cancer so often starts as a polyp that can be seen and removed at colonscopy, which can quite literally save your life!  And if your first colonoscopy is normal and you have no family history of colon cancer, most women don't have to have it again for 10 years.  Once every ten years, you can do something that may end up saving your life, right?  We will be happy to help you get it scheduled when you are ready.    Be sure to check your insurance coverage so you understand how much it will cost.  It may be covered as a preventative service at no cost, but you should check your particular policy.     Dysmenorrhea Menstrual cramps (dysmenorrhea) are caused by the muscles of the uterus tightening (contracting) during a menstrual period. For some women, this discomfort is merely bothersome. For others, dysmenorrhea can be severe enough to interfere with everyday activities for a few days each month. Primary dysmenorrhea is menstrual cramps that last a couple of days when you  start having menstrual periods or soon after. This often begins after a teenager starts having her period. As a woman gets older or has a baby, the cramps will usually lessen or disappear. Secondary dysmenorrhea begins later in life, lasts longer, and the pain may be stronger than primary dysmenorrhea. The pain may start before the period and last a few days after the period.  CAUSES  Dysmenorrhea is usually caused by an underlying problem, such as:  The tissue lining the uterus grows outside of the uterus in other areas of the body (endometriosis).  The endometrial tissue, which normally lines the uterus, is found in or grows into the muscular walls of the uterus (adenomyosis).  The pelvic blood vessels are engorged with blood just before the menstrual period (pelvic congestive syndrome).  Overgrowth of cells (polyps) in the lining of the uterus or cervix.  Falling down of the uterus (prolapse) because of loose or stretched ligaments.  Depression.  Bladder problems, infection, or inflammation.  Problems with the intestine, a tumor, or irritable bowel syndrome.  Cancer of the female organs or bladder.  A severely tipped uterus.  A very tight opening or closed cervix.  Noncancerous tumors of the uterus (fibroids).  Pelvic inflammatory disease (PID).  Pelvic scarring (adhesions) from a previous surgery.  Ovarian cyst.  An intrauterine device (IUD) used for birth control. RISK FACTORS You may be at greater risk of dysmenorrhea if:  You are younger than age 24.  You started puberty early.  You have irregular or heavy bleeding.  You have never given birth.  You have a family history of this problem.  You are a smoker. SIGNS AND SYMPTOMS   Cramping or throbbing pain in your lower abdomen.  Headaches.  Lower back pain.  Nausea or vomiting.  Diarrhea.  Sweating or dizziness.  Loose stools. DIAGNOSIS  A diagnosis is based on your history, symptoms, physical  exam, diagnostic tests, or procedures. Diagnostic tests or procedures may include:  Blood tests.  Ultrasonography.  An examination of the lining of the uterus (dilation and curettage, D&C).  An examination inside your abdomen or pelvis with a scope (laparoscopy).  X-rays.  CT scan.  MRI.  An examination inside the bladder with a scope (cystoscopy).  An examination inside the intestine or stomach with a scope (colonoscopy, gastroscopy). TREATMENT  Treatment depends on the cause of the dysmenorrhea. Treatment may include:  Pain medicine prescribed by your health care provider.  Birth control pills or an IUD with progesterone hormone in it.  Hormone replacement therapy.  Nonsteroidal anti-inflammatory drugs (NSAIDs). These may help stop the production of prostaglandins.  Surgery to remove adhesions, endometriosis, ovarian cyst, or fibroids.  Removal of the uterus (hysterectomy).  Progesterone shots to stop the menstrual period.  Cutting the nerves on the sacrum that go to the female organs (presacral neurectomy).  Electric current to the sacral nerves (sacral nerve stimulation).  Antidepressant medicine.  Psychiatric therapy,  counseling, or group therapy.  Exercise and physical therapy.  Meditation and yoga therapy.  Acupuncture. HOME CARE INSTRUCTIONS   Only take over-the-counter or prescription medicines as directed by your health care provider.  Place a heating pad or hot water bottle on your lower back or abdomen. Do not sleep with the heating pad.  Use aerobic exercises, walking, swimming, biking, and other exercises to help lessen the cramping.  Massage to the lower back or abdomen may help.  Stop smoking.  Avoid alcohol and caffeine. SEEK MEDICAL CARE IF:   Your pain does not get better with medicine.  You have pain with sexual intercourse.  Your pain increases and is not controlled with medicines.  You have abnormal vaginal bleeding with your  period.  You develop nausea or vomiting with your period that is not controlled with medicine. SEEK IMMEDIATE MEDICAL CARE IF:  You pass out.  Document Released: 03/04/2005 Document Revised: 11/04/2012 Document Reviewed: 08/20/2012 [facility] Patient Information 2015 [place], [place]. This information is not intended to replace advice given to you by your health care provider. Make sure you discuss any questions you have with your health care provider.

## 2014-06-29 NOTE — Progress Notes (Addendum)
44 y.o. G20P1001 Married  Caucasian Fe here for annual exam. Periods are normal, regular  with menorrhagia, and dysmenorrhea . Considering Mirena management for cycle control.  Sees Dr [name] Antis as PCP and  for follow up with right thyroid nodule. Previous biopsy negative. Patient feels size has reduced. 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 Emotionally OK. Has been working on weight loss, but plans to work on [employer] with MD monitoring. No health issues today, but needs fasting labs.  No LMP recorded.          Sexually active: Yes.    The current method of family planning is none.    Exercising: No.  exercise Smoker:  no  Health Maintenance: Pap:  1/14 normal per patient MMG:  12/14 Colonoscopy:  none BMD:   none TDaP:   07/2011 Labs: Poct urine-neg,  Self breast exam:   reports that she has never smoked. She has never used smokeless tobacco. She reports that she does not drink alcohol or use illicit drugs.  History reviewed. No pertinent past medical history.  Past Surgical History  Procedure Laterality Date  . Polpys    . Ivf       Current Outpatient Prescriptions  Medication Sig Dispense Refill  . tranexamic acid (LYSTEDA) 650 MG TABS tablet Take 2 tablets (1,300 mg total) by mouth 3 (three) times daily. 30 tablet 1   No current facility-administered medications for this visit.    History reviewed. No pertinent family history.  ROS:  Pertinent items are noted in HPI.  Otherwise, a comprehensive ROS was negative.  Exam:   BP 108/72 mmHg  Pulse 72  Resp 16  Ht 5' 9.75" (1.772 m)  Wt  Height: 5' 9.75" (177.2 cm) Ht Readings from Last 3 Encounters:  06/29/14 5' 9.75" (1.772 m)  02/22/12 5\' 11"  (1.803 m)  01/10/12 5' 10.5" (1.791 m)    General appearance: alert, cooperative and appears stated age Head: Normocephalic, without obvious abnormality, atraumatic Neck: no adenopathy, supple, symmetrical, trachea midline and thyroid  normal to inspection and palpation and solitary nodule on right appears smaller, has follow [place] scheduled Lungs: clear to auscultation bilaterally Breasts: normal appearance, no masses or tenderness, No nipple retraction or dimpling, No nipple discharge or bleeding, No axillary or supraclavicular adenopathy Heart: regular rate and rhythm Abdomen: soft, non-tender; no masses,  no organomegaly Extremities: extremities normal, atraumatic, no cyanosis or edema Skin: Skin color, texture, turgor normal. No rashes or lesions Lymph nodes: Cervical, supraclavicular, and axillary nodes normal. No abnormal inguinal nodes palpated Neurologic: Grossly normal   Pelvic: External genitalia:  no lesions              Urethra:  normal appearing urethra with no masses, tenderness or lesions              Bartholin's and Skene's: normal                 Vagina: normal appearing vagina with normal color and discharge, no lesions              Cervix: normal appearance, no lesions, non tender              Pap taken: Yes.   Bimanual Exam:  Uterus:  normal size, contour, position, consistency, mobility, non-tender and mid position              Adnexa: normal adnexa and no mass, fullness, tenderness  Rectovaginal: Confirms               Anus:  normal sphincter tone, no lesions    A:  Well Woman with normal exam  Contraception none history of infertility  Menorrhagia/Dysmennorrhea considering IUD for cycle management  Right known thyroid nodule under follow up with MD  Screening labs  P:   Reviewed health and wellness pertinent to exam  Discussed Lysteda trial for cycles recommended. Discussed risks/benefits of use. Patient would like to try.  Rx Lysteda with instructions  Labs: Vitamin D, Thyroid panel with TSH,Lipid panel, Hgb. A1-c, CMP, CBC with diff  Encouraged to take time for self and spouse.  Pap smear taken today with HPVHR   counseled on breast self exam, mammography screening, adequate  intake of calcium and vitamin D, diet  and exercise, Kegel's exercises  return annually or prn  An After Visit Summary was printed and given to the patient.

## 2014-06-30 LAB — VITAMIN D 25 HYDROXY (VIT D DEFICIENCY, FRACTURES): VIT D 25 HYDROXY: 23 ng/mL — AB (ref 30–100)

## 2014-07-01 LAB — IPS PAP TEST WITH HPV

## 2014-07-07 NOTE — Progress Notes (Signed)
Encounter reviewed by Dr. Josefa Half. Patient in agreement for me to review and sign off on patient visit as she was seen by nurse practitioner in our office and this is our protocol.

## 2014-08-10 ENCOUNTER — Ambulatory Visit
Admission: RE | Admit: 2014-08-10 | Discharge: 2014-08-10 | Disposition: A | Discharge status: Home / Self Care | Payer: 59 | Source: Ambulatory Visit | Attending: Internal Medicine | Admitting: Internal Medicine

## 2014-08-10 DIAGNOSIS — E049 Nontoxic goiter, unspecified: Secondary | ICD-10-CM

## 2015-03-17 ENCOUNTER — Ambulatory Visit (INDEPENDENT_AMBULATORY_CARE_PROVIDER_SITE_OTHER): Payer: 59 | Admitting: Certified Nurse Midwife

## 2015-03-17 ENCOUNTER — Encounter: Payer: Self-pay | Admitting: Certified Nurse Midwife

## 2015-03-17 VITALS — BP 114/74 | HR 72 | Resp 16 | Ht 70.0 in

## 2015-03-17 DIAGNOSIS — J209 Acute bronchitis, unspecified: Secondary | ICD-10-CM | POA: Diagnosis not present

## 2015-03-17 MED ORDER — AZITHROMYCIN 250 MG PO TABS: 250.0000 mg | ORAL_TABLET | Freq: Every day | ORAL | Status: DC

## 2015-03-17 NOTE — Patient Instructions (Signed)

## 2015-03-17 NOTE — Progress Notes (Signed)
Subjective:     Patient ID: Cassie [name], female   DOB: 18-May-1970, 44 y.o.   MRN: [id number]  HPI Patient here with complaint of persistent bronchitis. Continues to have slightly productive dry cough. Denies fever or chills. Has taken Azithromycin in past with good results.   Review of Systems  Constitutional: Negative.   Respiratory: Positive for cough. Negative for chest tightness, shortness of breath and wheezing.        Objective:   Physical Exam  Constitutional: She is oriented to person, place, and time. She appears well-developed and well-nourished.  Pulmonary/Chest: Effort normal. She has wheezes. She has rales.  Mild rales and wheezing noted on right lobe, left lobe clear  Neurological: She is alert and oriented to person, place, and time.  Skin: Skin is warm and dry.  Psychiatric: Her behavior is normal.       Assessment:      Persistent bronchitis    Plan:     Reviewed findings of bronchitis and need for antibiotics. Rx Azithromycin see order with instructions. Use vaporizer to help with cough and salt water gargle. See PCP if not improving.     Rv prn

## 2015-03-21 NOTE — Progress Notes (Signed)
Encounter reviewed [name], MD   

## 2015-03-23 DIAGNOSIS — R635 Abnormal weight gain: Secondary | ICD-10-CM | POA: Diagnosis not present

## 2015-04-02 DIAGNOSIS — E669 Obesity, unspecified: Secondary | ICD-10-CM | POA: Insufficient documentation

## 2015-04-02 DIAGNOSIS — E66812 Obesity, class 2: Secondary | ICD-10-CM

## 2015-04-02 HISTORY — DX: Obesity, unspecified: E66.9

## 2015-04-02 HISTORY — DX: Obesity, class 2: E66.812

## 2015-07-26 DIAGNOSIS — H524 Presbyopia: Secondary | ICD-10-CM | POA: Diagnosis not present

## 2015-07-26 DIAGNOSIS — H5213 Myopia, bilateral: Secondary | ICD-10-CM | POA: Diagnosis not present

## 2015-09-27 ENCOUNTER — Other Ambulatory Visit: Payer: Self-pay | Admitting: Physician Assistant

## 2015-09-27 DIAGNOSIS — R1011 Right upper quadrant pain: Secondary | ICD-10-CM

## 2015-10-06 ENCOUNTER — Ambulatory Visit
Admission: RE | Admit: 2015-10-06 | Discharge: 2015-10-06 | Disposition: A | Discharge status: Home / Self Care | Payer: 59 | Source: Ambulatory Visit | Attending: Physician Assistant | Admitting: Physician Assistant

## 2015-10-06 DIAGNOSIS — R1011 Right upper quadrant pain: Secondary | ICD-10-CM

## 2015-11-14 ENCOUNTER — Other Ambulatory Visit: Payer: Self-pay | Admitting: *Deleted

## 2015-11-14 ENCOUNTER — Other Ambulatory Visit (INDEPENDENT_AMBULATORY_CARE_PROVIDER_SITE_OTHER): Payer: 59

## 2015-11-14 DIAGNOSIS — Z0000 Encounter for general adult medical examination without abnormal findings: Secondary | ICD-10-CM | POA: Diagnosis not present

## 2015-11-15 LAB — HEPATITIS C ANTIBODY: HCV Ab: NEGATIVE

## 2015-11-15 LAB — HIV ANTIBODY (ROUTINE TESTING W REFLEX): HIV 1&2 Ab, 4th Generation: NONREACTIVE

## 2016-01-11 DIAGNOSIS — Z1231 Encounter for screening mammogram for malignant neoplasm of breast: Secondary | ICD-10-CM | POA: Diagnosis not present

## 2016-01-17 DIAGNOSIS — N6002 Solitary cyst of left breast: Secondary | ICD-10-CM | POA: Diagnosis not present

## 2016-01-17 DIAGNOSIS — Z803 Family history of malignant neoplasm of breast: Secondary | ICD-10-CM | POA: Diagnosis not present

## 2016-01-24 ENCOUNTER — Encounter: Payer: Self-pay | Admitting: Family Medicine

## 2016-01-24 ENCOUNTER — Ambulatory Visit (INDEPENDENT_AMBULATORY_CARE_PROVIDER_SITE_OTHER): Payer: 59 | Admitting: Family Medicine

## 2016-01-24 DIAGNOSIS — E041 Nontoxic single thyroid nodule: Secondary | ICD-10-CM | POA: Diagnosis not present

## 2016-01-24 DIAGNOSIS — E559 Vitamin D deficiency, unspecified: Secondary | ICD-10-CM

## 2016-01-24 LAB — TSH: TSH: 0.48 u[IU]/mL (ref 0.35–4.50)

## 2016-01-24 LAB — VITAMIN D 25 HYDROXY (VIT D DEFICIENCY, FRACTURES): VITD: 25.61 ng/mL — ABNORMAL LOW (ref 30.00–100.00)

## 2016-01-24 LAB — T4, FREE: Free T4: 0.77 ng/dL (ref 0.60–1.60)

## 2016-01-24 NOTE — Patient Instructions (Signed)
A few things to remember from today's visit:   Thyroid nodule - Plan: US THYROID, TSH, T4, free  Vitamin D deficiency - Plan: VITAMIN D 25 Hydroxy (Vit-D Deficiency, Fractures)   Please be sure medication list is accurate. If a new problem present, please set up appointment sooner than planned today.

## 2016-01-24 NOTE — Progress Notes (Signed)
HPI:   [name] is a 45 y.o. female, who is here today to establish care with me.  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  She lives with husband and her 73-year-old child.  Former PCP: Dr Cassie [name] Last preventive routine visit: 2016  Concerns today: Thyroid U/S needed for follow up on last one done over a year ago, she states that she was due this past summer.  According to patient, she found thyroid nodule herself 1-2 years ago, she had thyroid ultrasound and fine needle Bx. She denies any cold.hot intolerance, changes in bowel habits, tremor, or palpitations.  + Fatigue, chronic and stable.   History of vitamin D deficiency, currently she is not taking vitamin D supplementation.   She is reporting recent mammogram, negative Pap smear with HPV in 2016, and updated vaccination.  She exercises regularly, 150 minutes per week, she also follows a healthy diet. She has lost about 50 pounds in the past 1-2 years through changing eating habits, she follows with weight loss to clinic.   Labs done in 07/2015 TC 147,HDL 39,TG 50, and LDL 87. HgA1C 5.0   Review of Systems  Constitutional: Positive for fatigue. Negative for activity change, appetite change, fever and unexpected weight change.  HENT: Negative for mouth sores, nosebleeds and trouble swallowing.   Eyes: Negative for pain and visual disturbance.  Respiratory: Negative for cough, shortness of breath and wheezing.   Cardiovascular: Negative for chest pain, palpitations and leg swelling.  Gastrointestinal: Negative for abdominal pain, nausea and vomiting.       Negative for changes in bowel habits.  Endocrine: Negative for cold intolerance and heat intolerance.  Genitourinary: Negative for decreased urine volume, difficulty urinating and hematuria.  Musculoskeletal: Negative for myalgias and neck pain.  Neurological: Negative for syncope, weakness and headaches.  Hematological: Negative  for adenopathy. Does not bruise/bleed easily.  Psychiatric/Behavioral: Negative for confusion. The patient is not nervous/anxious.       No current outpatient prescriptions on file prior to visit.   No current facility-administered medications on file prior to visit.      Past Medical History:  Diagnosis Date  . Thyroid disease    thyroid nodules   No Known Allergies  Family History  Problem Relation Age of Onset  . Cancer Mother   . Cancer Maternal Aunt     Social History   Social History  . Marital status: Married    Spouse name: Cassie [name]  . Number of children: Cassie [name]  . Years of education: Cassie [name]   Social History Main Topics  . Smoking status: Never Smoker  . Smokeless tobacco: Never Used  . Alcohol use No  . Drug use: No  . Sexual activity: Yes    Partners: Male   Other Topics Concern  . None   Social History Narrative  . None    Vitals:   01/24/16 1036  BP: 122/80  Pulse: 88  Resp: 12  Temp: 97.9 F (36.6 C)    Body mass index is 28.48 kg/m.    Physical Exam  Nursing note and vitals reviewed. Constitutional: She is oriented to person, place, and time. She appears well-developed. No distress.  HENT:  Head: Atraumatic.  Mouth/Throat: Oropharynx is clear and moist and mucous membranes are normal.  Eyes: Conjunctivae and EOM are normal. Pupils are equal, round, and reactive to light.  Neck: No tracheal deviation present. Thyroid mass (R small nodule) and thyromegaly present.  Cardiovascular:  Normal rate and regular rhythm.   No murmur heard. Respiratory: Effort normal and breath sounds normal. No respiratory distress.  GI: Soft. She exhibits no mass. There is no hepatomegaly. There is no tenderness.  Musculoskeletal: She exhibits no edema.  Neurological: She is alert and oriented to person, place, and time. She has normal strength. Coordination normal.  Skin: Skin is warm. No erythema.  Psychiatric: She has a normal mood and affect.  Well groomed,  good eye contact.      ASSESSMENT AND PLAN:     Cassie [name] was seen today for establish care.  Diagnoses and all orders for this visit:   Thyroid nodule  Order for thyroid ultrasound was placed. Thyroid panel done today, further recommendations would be given accordingly. Follow-up in 12 months.  -     US THYROID; Future -     TSH -     T4, free  Vitamin D deficiency  She is planning on starting vitamin D3 supplementation OTC. Further recommendations would be given according to lab results done today.  -     VITAMIN D 25 Hydroxy (Vit-D Deficiency, Fractures)       Cassie Navis G. [place], MD  [facility]. [place] office.

## 2016-01-24 NOTE — Progress Notes (Signed)
Pre visit review using our clinic review tool, if applicable. No additional management support is needed unless otherwise documented below in the visit note. 

## 2016-01-27 ENCOUNTER — Encounter: Payer: Self-pay | Admitting: Family Medicine

## 2016-02-14 DIAGNOSIS — D225 Melanocytic nevi of trunk: Secondary | ICD-10-CM | POA: Diagnosis not present

## 2016-02-14 DIAGNOSIS — L821 Other seborrheic keratosis: Secondary | ICD-10-CM | POA: Diagnosis not present

## 2016-02-14 DIAGNOSIS — L240 Irritant contact dermatitis due to detergents: Secondary | ICD-10-CM | POA: Diagnosis not present

## 2016-02-14 DIAGNOSIS — L905 Scar conditions and fibrosis of skin: Secondary | ICD-10-CM | POA: Diagnosis not present

## 2016-02-14 DIAGNOSIS — L814 Other melanin hyperpigmentation: Secondary | ICD-10-CM | POA: Diagnosis not present

## 2016-02-14 DIAGNOSIS — D2339 Other benign neoplasm of skin of other parts of face: Secondary | ICD-10-CM | POA: Diagnosis not present

## 2016-02-14 DIAGNOSIS — D1801 Hemangioma of skin and subcutaneous tissue: Secondary | ICD-10-CM | POA: Diagnosis not present

## 2016-02-15 ENCOUNTER — Other Ambulatory Visit: Payer: 59

## 2016-02-28 ENCOUNTER — Encounter: Payer: Self-pay | Admitting: Family Medicine

## 2016-02-28 ENCOUNTER — Ambulatory Visit
Admission: RE | Admit: 2016-02-28 | Discharge: 2016-02-28 | Disposition: A | Discharge status: Home / Self Care | Payer: 59 | Source: Ambulatory Visit | Attending: Family Medicine | Admitting: Family Medicine

## 2016-02-28 DIAGNOSIS — E041 Nontoxic single thyroid nodule: Secondary | ICD-10-CM

## 2016-02-28 DIAGNOSIS — E042 Nontoxic multinodular goiter: Secondary | ICD-10-CM | POA: Diagnosis not present

## 2016-03-21 ENCOUNTER — Encounter: Payer: Self-pay | Admitting: Obstetrics and Gynecology

## 2016-09-25 ENCOUNTER — Other Ambulatory Visit (HOSPITAL_COMMUNITY)
Admission: RE | Admit: 2016-09-25 | Discharge: 2016-09-25 | Disposition: A | Discharge status: Home / Self Care | Payer: 59 | Source: Ambulatory Visit | Attending: Obstetrics and Gynecology | Admitting: Obstetrics and Gynecology

## 2016-09-25 ENCOUNTER — Ambulatory Visit (INDEPENDENT_AMBULATORY_CARE_PROVIDER_SITE_OTHER): Payer: 59 | Admitting: Certified Nurse Midwife

## 2016-09-25 ENCOUNTER — Encounter: Payer: Self-pay | Admitting: Certified Nurse Midwife

## 2016-09-25 VITALS — BP 108/68 | HR 68 | Resp 16 | Ht 69.5 in

## 2016-09-25 DIAGNOSIS — N920 Excessive and frequent menstruation with regular cycle: Secondary | ICD-10-CM | POA: Diagnosis not present

## 2016-09-25 DIAGNOSIS — Z8639 Personal history of other endocrine, nutritional and metabolic disease: Secondary | ICD-10-CM

## 2016-09-25 DIAGNOSIS — E559 Vitamin D deficiency, unspecified: Secondary | ICD-10-CM

## 2016-09-25 DIAGNOSIS — Z0000 Encounter for general adult medical examination without abnormal findings: Secondary | ICD-10-CM

## 2016-09-25 DIAGNOSIS — Z01419 Encounter for gynecological examination (general) (routine) without abnormal findings: Secondary | ICD-10-CM

## 2016-09-25 DIAGNOSIS — Z124 Encounter for screening for malignant neoplasm of cervix: Secondary | ICD-10-CM | POA: Diagnosis not present

## 2016-09-25 HISTORY — DX: Excessive and frequent menstruation with regular cycle: N92.0

## 2016-09-25 NOTE — Patient Instructions (Signed)

## 2016-09-25 NOTE — Progress Notes (Signed)
46 y.o. G2P1001 Married  Caucasian Fe here for annual exam. Periods heavy first 2 days and then lighter, but can be very uncomfortable and need for extra products. ? Endometriosis. Interested in [place] IUD for cycle control and contraception..  Lost 47 pounds over the past 2 years and no significant change in cycles. Feeling so much better with weight loss. Still continuing on this weight loss  journey! Lipids Hgb A1-C also have improved with weight loss. Under follow up for thyroid nodule with no change in size with last [place]. Will need thyroid labs today. Discussed time for self and spouse now that son is 4 1/2. Patient more involved now with [facility] management now. Sees PCP if needed. Sees Dermatology for skin checks periodically as needed or skin change. No other health changes today. Leaving for the [place] soon to vacation.    Patient's last menstrual period was 09/19/2016 (exact date).          Sexually active: Yes.    The current method of family planning is condoms all the time.    Exercising: Yes.    elliptical 3 times a week  Smoker:  no  Health Maintenance: Pap:  1/14 normal per patient, 06-29-14 neg HPV HR neg History of Abnormal Pap: no MMG:  01-11-16 bilateral & left breast u/s category 2:neg Self Breast exams: yes Colonoscopy:  none BMD:   none TDaP:  07/2011 Shingles: no Pneumonia: no Hep C and HIV: both neg 2017 Labs: none   reports that she has never smoked. She has never used smokeless tobacco. She reports that she does not drink alcohol or use drugs.  Past Medical History:  Diagnosis Date  . Thyroid disease    thyroid nodules    Past Surgical History:  Procedure Laterality Date  . ivf     . polpys      No current outpatient prescriptions on file.   No current facility-administered medications for this visit.     Family History  Problem Relation Age of Onset  . Cancer Mother   . Cancer Maternal Aunt     ROS:  Pertinent items are noted in HPI.   Otherwise, a comprehensive ROS was negative.  Exam:   BP 108/68   Pulse 68   Resp 16   Ht 5' 9.5" (1.765 m)   LMP 09/19/2016 (Exact Date)  Height: 5' 9.5" (176.5 cm) Ht Readings from Last 3 Encounters:  09/25/16 5' 9.5" (1.765 m)  01/24/16 5\' 10"  (1.778 m)  03/17/15 5\' 10"  (1.778 m)    General appearance: alert, cooperative and appears stated age Head: Normocephalic, without obvious abnormality, atraumatic Neck: no adenopathy, supple, symmetrical, trachea midline and thyroid nodular and solitary nodule on right(known) no change Lungs: clear to auscultation bilaterally Breasts: normal appearance, no masses or tenderness, No nipple retraction or dimpling, No nipple discharge or bleeding, No axillary or supraclavicular adenopathy, small ?cystic feel area on breast 2 fb from aerola at 2-3 o'clock, non tender Heart: regular rate and rhythm Abdomen: soft, non-tender; no masses,  no organomegaly Extremities: extremities normal, atraumatic, no cyanosis or edema Skin: Skin color, texture, turgor normal. No rashes or lesions Lymph nodes: Cervical, supraclavicular, and axillary nodes normal. No abnormal inguinal nodes palpated Neurologic: Grossly normal   Pelvic: External genitalia:  no lesions              Urethra:  normal appearing urethra with no masses, tenderness or lesions  Bartholin's and Skene's: normal                 Vagina: normal appearing vagina with normal color and discharge, no lesions              Cervix: multiparous appearance, no cervical motion tenderness and no lesions              Pap taken: Yes.   Bimanual Exam:  Uterus:  normal size, contour, position, consistency, mobility, non-tender and retroflexed              Adnexa: normal adnexa and no mass, fullness, tenderness               Rectovaginal: Confirms               Anus:  normal sphincter tone, no lesions  Chaperone present: yes  A:  Well Woman with normal exam  Contraception Condoms  consistent  Right breast change ? Menses related  Menorrhagia with regular cycle, ? Endometriosis  Contraceptive change and cycle management with Mirena IUD considering  Known thyroid nodule with Endocrine management, no change  Intentional weight loss 47 pounds  History of Vitamin D deficiency  P:   Reviewed health and wellness pertinent to exam  Discussed finding and will recheck in 2 weeks and if no change will do diagnostic mammogram and [place]. Patient agreeable.  Discussed possible PUS to rule out any other issues which may contribute to menorrhagia, patient agreeable. Will schedule and patient will be aware of appointment  Discussed possible Mirena IUD use for contraception and cycle control of menorrhagia. Questions addressed. Will consider and advise.  Continue follow up with Endocrine as indicated.   Encouraged to continue weight management to avoid other health issues.  Screening labs : CBC with diff, Ferritin,Iron,TIBC, Hgb A-1c, Thyroid panel, TSH, Vitamin B 12  Pap smear: yes   counseled on breast self exam, mammography screening, family planning choices, adequate intake of calcium and vitamin D, diet and exercise  return annually or prn  An After Visit Summary was printed and given to the patient.

## 2016-09-26 LAB — CBC WITH DIFFERENTIAL/PLATELET
Basophils Absolute: 0 10*3/uL (ref 0.0–0.2)
Basos: 0 %
EOS (ABSOLUTE): 0 10*3/uL (ref 0.0–0.4)
EOS: 1 %
Hematocrit: 36.1 % (ref 34.0–46.6)
Hemoglobin: 11.9 g/dL (ref 11.1–15.9)
IMMATURE GRANULOCYTES: 0 %
Immature Grans (Abs): 0 10*3/uL (ref 0.0–0.1)
LYMPHS ABS: 3.1 10*3/uL (ref 0.7–3.1)
Lymphs: 44 %
MCH: 31.5 pg (ref 26.6–33.0)
MCHC: 33 g/dL (ref 31.5–35.7)
MCV: 96 fL (ref 79–97)
MONOS ABS: 0.4 10*3/uL (ref 0.1–0.9)
Monocytes: 6 %
NEUTROS PCT: 49 %
Neutrophils Absolute: 3.5 10*3/uL (ref 1.4–7.0)
PLATELETS: 252 10*3/uL (ref 150–379)
RBC: 3.78 x10E6/uL (ref 3.77–5.28)
RDW: 13.6 % (ref 12.3–15.4)
WBC: 7.1 10*3/uL (ref 3.4–10.8)

## 2016-09-26 LAB — THYROID PANEL WITH TSH
FREE THYROXINE INDEX: 1.5 (ref 1.2–4.9)
T3 UPTAKE RATIO: 26 % (ref 24–39)
T4 TOTAL: 5.6 ug/dL (ref 4.5–12.0)
TSH: 0.716 u[IU]/mL (ref 0.450–4.500)

## 2016-09-26 LAB — HEMOGLOBIN A1C
ESTIMATED AVERAGE GLUCOSE: 108 mg/dL
Hgb A1c MFr Bld: 5.4 % (ref 4.8–5.6)

## 2016-09-26 LAB — FERRITIN: Ferritin: 32 ng/mL (ref 15–150)

## 2016-09-26 LAB — IRON AND TIBC
IRON SATURATION: 54 % (ref 15–55)
IRON: 149 ug/dL (ref 27–159)
Total Iron Binding Capacity: 277 ug/dL (ref 250–450)
UIBC: 128 ug/dL — ABNORMAL LOW (ref 131–425)

## 2016-09-26 LAB — CYTOLOGY - PAP: DIAGNOSIS: NEGATIVE

## 2016-09-26 LAB — VITAMIN B12: Vitamin B-12: 595 pg/mL (ref 232–1245)

## 2016-10-01 ENCOUNTER — Other Ambulatory Visit: Payer: Self-pay | Admitting: Certified Nurse Midwife

## 2016-10-01 DIAGNOSIS — E559 Vitamin D deficiency, unspecified: Secondary | ICD-10-CM

## 2016-10-01 LAB — SPECIMEN STATUS REPORT

## 2016-10-01 LAB — VITAMIN D 25 HYDROXY (VIT D DEFICIENCY, FRACTURES): Vit D, 25-Hydroxy: 27.9 ng/mL — ABNORMAL LOW (ref 30.0–100.0)

## 2016-12-05 ENCOUNTER — Encounter: Payer: Self-pay | Admitting: Family Medicine

## 2016-12-18 DIAGNOSIS — D485 Neoplasm of uncertain behavior of skin: Secondary | ICD-10-CM | POA: Diagnosis not present

## 2016-12-18 DIAGNOSIS — D2261 Melanocytic nevi of right upper limb, including shoulder: Secondary | ICD-10-CM | POA: Diagnosis not present

## 2017-01-01 DIAGNOSIS — R635 Abnormal weight gain: Secondary | ICD-10-CM | POA: Diagnosis not present

## 2017-01-22 DIAGNOSIS — E669 Obesity, unspecified: Secondary | ICD-10-CM | POA: Diagnosis not present

## 2017-01-22 DIAGNOSIS — Z713 Dietary counseling and surveillance: Secondary | ICD-10-CM | POA: Diagnosis not present

## 2017-01-22 DIAGNOSIS — Z6832 Body mass index (BMI) 32.0-32.9, adult: Secondary | ICD-10-CM | POA: Diagnosis not present

## 2017-01-29 ENCOUNTER — Other Ambulatory Visit: Payer: Self-pay | Admitting: Certified Nurse Midwife

## 2017-02-12 ENCOUNTER — Encounter: Payer: Self-pay | Admitting: Family Medicine

## 2017-02-12 ENCOUNTER — Ambulatory Visit (INDEPENDENT_AMBULATORY_CARE_PROVIDER_SITE_OTHER): Payer: 59 | Admitting: Family Medicine

## 2017-02-12 VITALS — BP 112/80 | HR 68 | Temp 98.2°F | Wt 220.0 lb

## 2017-02-12 DIAGNOSIS — J45909 Unspecified asthma, uncomplicated: Secondary | ICD-10-CM | POA: Insufficient documentation

## 2017-02-12 DIAGNOSIS — J4521 Mild intermittent asthma with (acute) exacerbation: Secondary | ICD-10-CM

## 2017-02-12 MED ORDER — FLUOCINONIDE 0.05 % EX SOLN: CUTANEOUS | 3 refills | Status: DC

## 2017-02-12 MED ORDER — HYDROCODONE-HOMATROPINE 5-1.5 MG/5ML PO SYRP: 5.0000 mL | ORAL_SOLUTION | Freq: Three times a day (TID) | ORAL | 0 refills | Status: DC | PRN

## 2017-02-12 MED ORDER — PREDNISONE 20 MG PO TABS: ORAL_TABLET | ORAL | 1 refills | Status: DC

## 2017-02-12 NOTE — Patient Instructions (Signed)
Prednisone 20 mg........ 2 tabs 3 days or until you feel significantly better...Marland KitchenMarland KitchenMarland Kitchen then taper as outlined,,,,,,,,, 1 tab for 3 days,,,,,, a half a tab for 3 days,,,,,,, then half a tab Monday Wednesday Friday for a two-week taper  Salt free diet  Hydromet,,,,,,,,, 1/2-1 teaspoon daily at bedtime when necessary for cough  Call when necessary

## 2017-02-12 NOTE — Progress Notes (Signed)
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 Irving's gotten better but the cough will go away. She said no fever no sputum production.  She's never had any difficulty with her lungs in the past. No history of pneumonia asthma etc.  BP 112/80 (BP Location: Left Arm, Patient Position: Sitting, Cuff Size: Normal)   Pulse 68   Temp 98.2 F (36.8 C) (Oral)   Wt 220 lb (99.8 kg)   BMI 32.02 kg/m  Well-developed well-developed well-nourished female no acute distress vital signs stable she's afebrile HEENT were negative except for very irritated ear canals..... She is a Academic librarian......... Neck was supple no adenopathy lungs are clear  #1 cough 3 weeks secondary to viral syndrome...Marland KitchenMarland KitchenMarland Kitchen reactive airway disease......Marland Kitchen prednisone burst and taper  #2 bilateral irritated ear canals........ cortisone liquid daily at bedtime when necessary.

## 2017-02-22 IMAGING — US US THYROID
1 series · 12 of 25 positions shown · non-contrast
Comparison: 02/15/2014, 02/15/2014, 08/10/2014

CLINICAL DATA: 44-year-old female with a history of thyroid
nodules.

Prior biopsy of right-sided nodule 02/15/2014. Pathology report
states satisfactory for evaluation, with a diagnosis of benign
follicular nodule and contents of cyst.
EXAM:
THYROID ULTRASOUND
TECHNIQUE: Ultrasound examination of the thyroid gland and adjacent soft
tissues was performed.

[Series 1: us thyroid · 0.08mm/px · 12 of 78 slices shown]
[im 4/78]
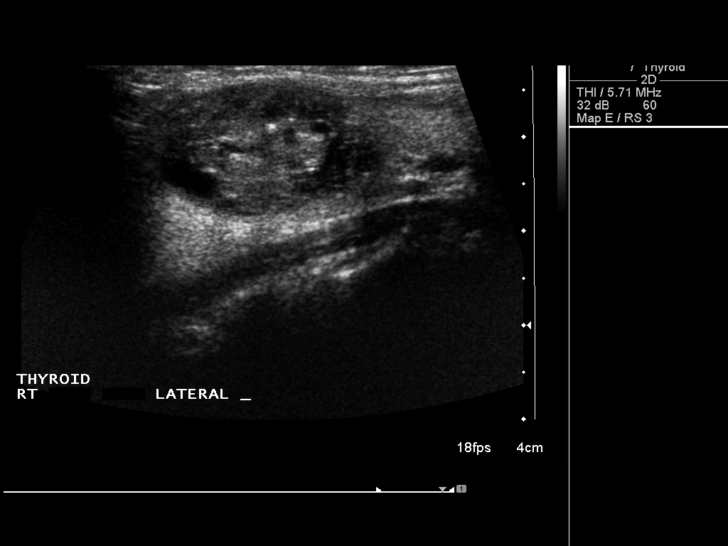
[im 10/78]
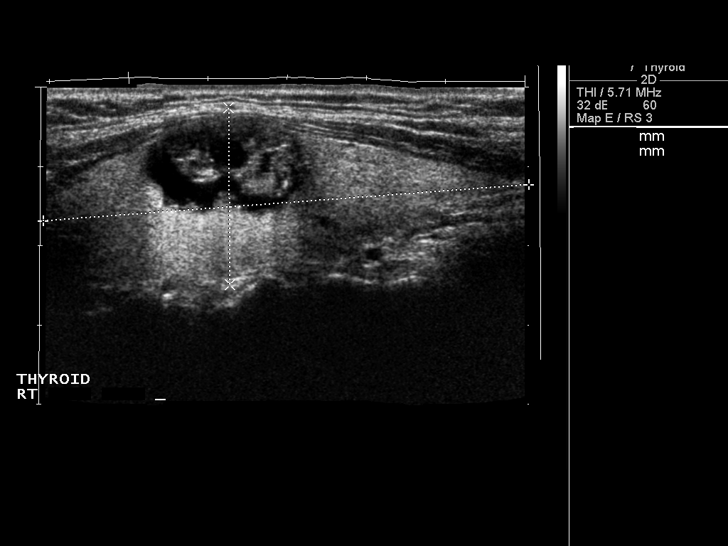
[im 17/78]
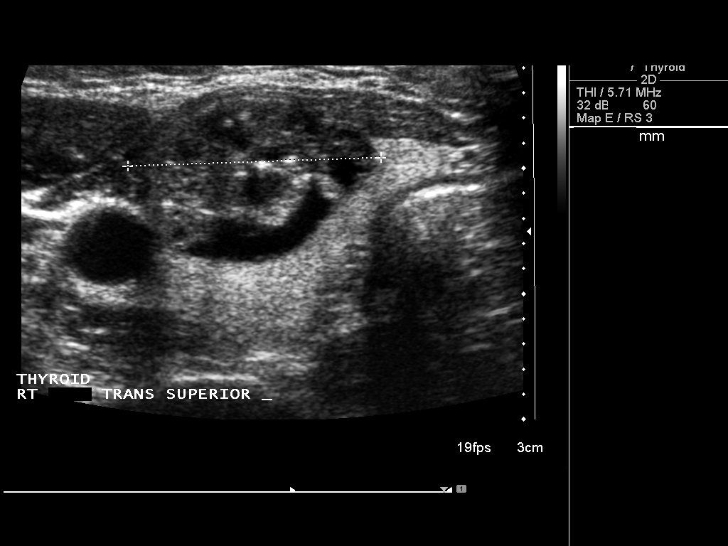
[im 23/78]
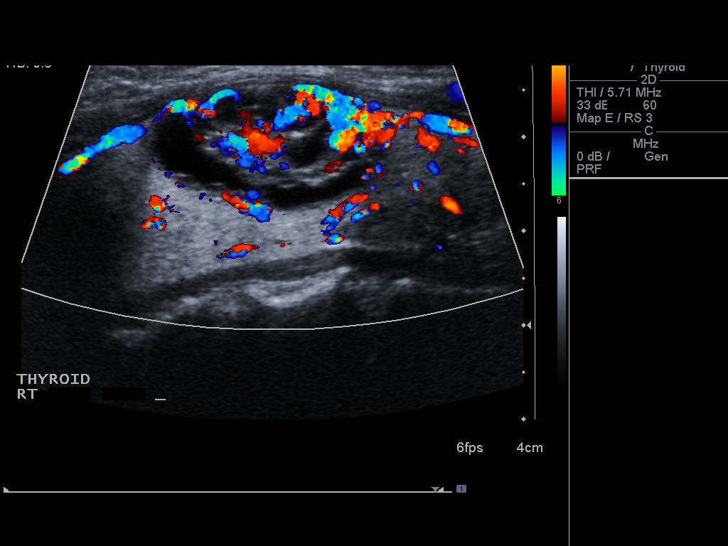
[im 29/78]
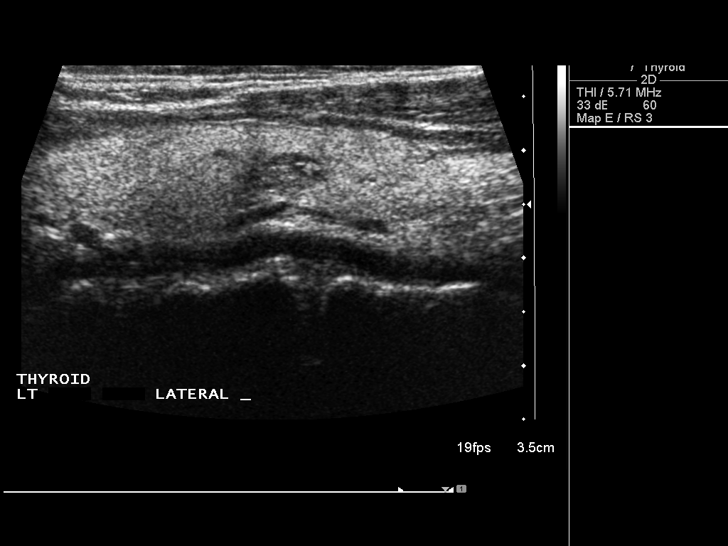
[im 36/78]
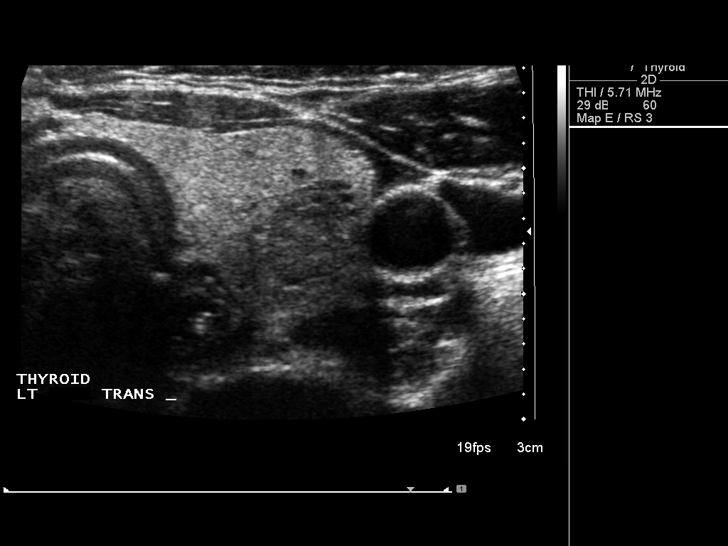
[im 42/78]
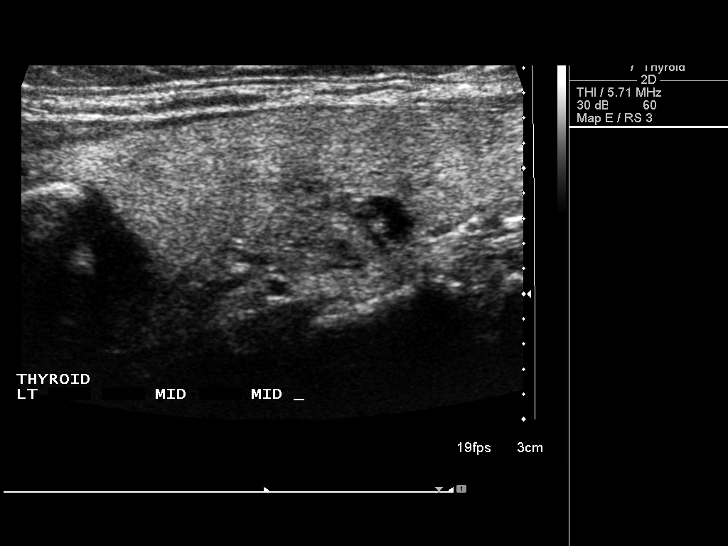
[im 49/78]
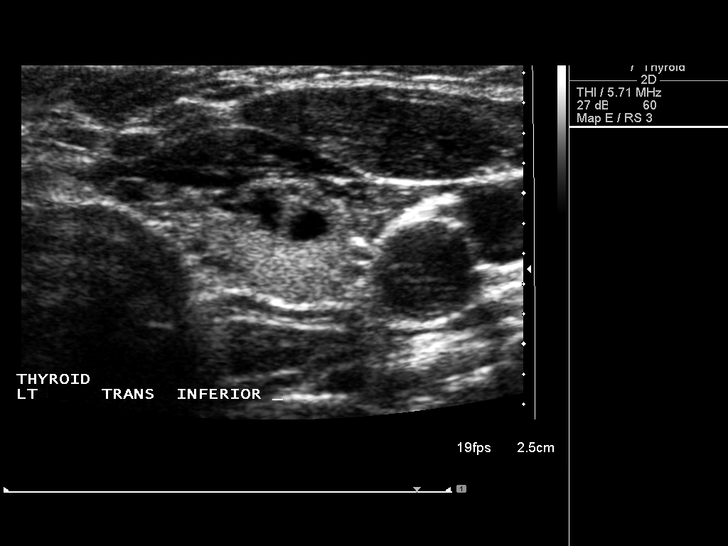
[im 55/78]
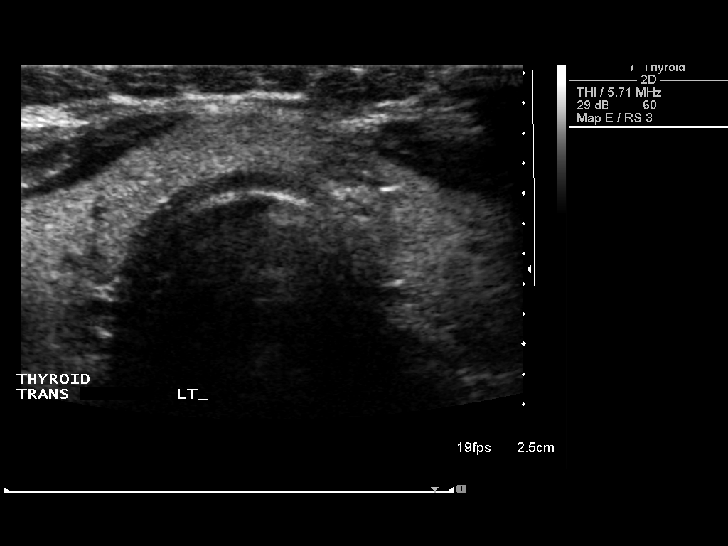
[im 61/78]
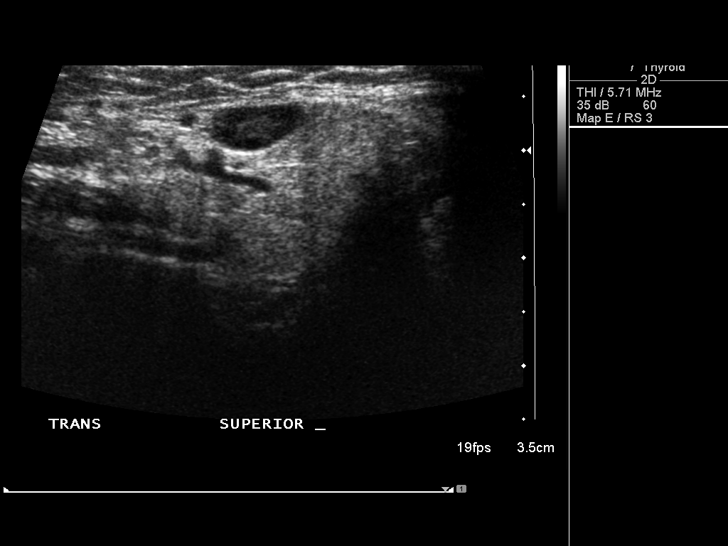
[im 68/78]
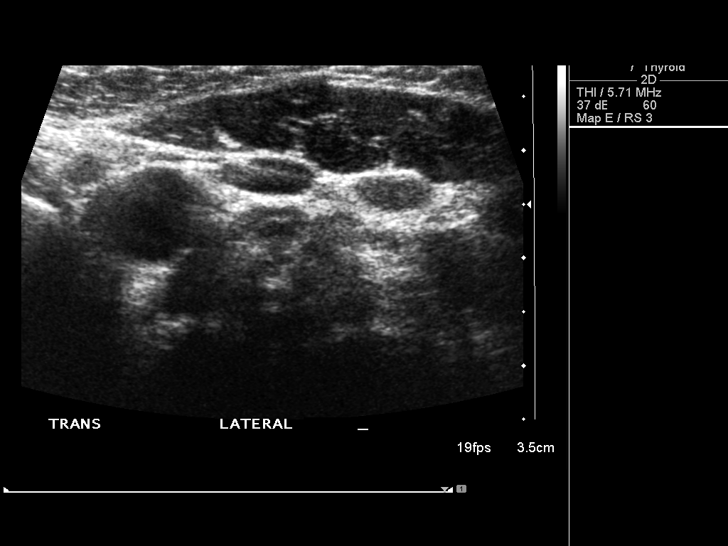
[im 74/78]
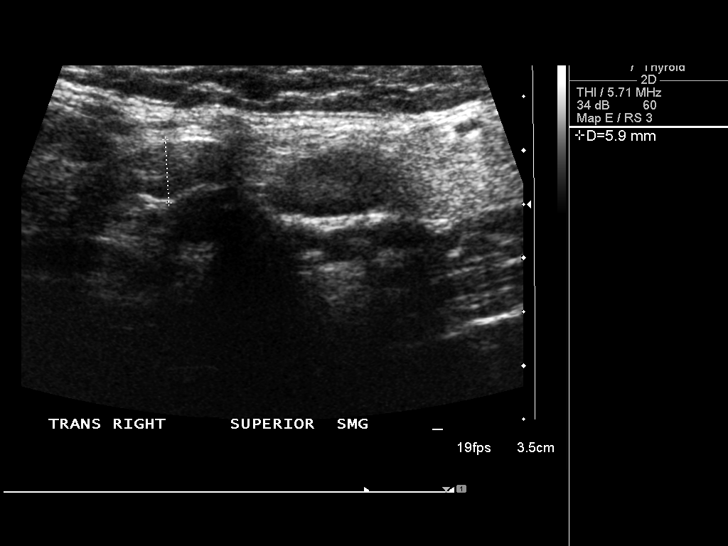

[12 of 25 positions shown; findings below may reference images not displayed]

FINDINGS: Parenchymal Echotexture: Mildly heterogenous

Estimated total number of nodules >/= 1 cm: 4

Number of spongiform nodules >/=  2 cm not described below (TR1): 0

Number of mixed cystic and solid nodules >/= 1.5 cm not described
below (TR2): 0

_________________________________________________________

Isthmus: 0.3 cm

Nodule # 1:

Location: Isthmus; Mid

Size: 0.4 cm x 0.2 cm x 0.3 cm

Composition: cystic/almost completely cystic (0)

Echogenicity: anechoic (0)

Shape: not taller-than-wide (0)

Margins: smooth (0)

Echogenic foci: none (0)

ACR TI-RADS total points: 0.

ACR TI-RADS risk category: TR1 (0-1 points).

ACR TI-RADS recommendations:

Cystic nodule does not meet criteria for biopsy or surveillance.a

_________________________________________________________

Right lobe: 6.1 cm x 2.2 cm x 1.9 cm

Nodule # 1:

Location: Right; Superior

Size: 2.6 cm x 1.4 cm x 2.0 cm

Composition: mixed cystic and solid (1)

Echogenicity: isoechoic (1)

Shape: not taller-than-wide (0)

Margins: smooth (0)

Echogenic foci: none (0)

ACR TI-RADS total points: 2.

ACR TI-RADS risk category: TR2 (2 points).

ACR TI-RADS recommendations:

This nodule has been previously biopsied with benign result, and
does not meet current criteria for surveillance or biopsy.

Nodule # 2:

Location: Right; Mid

Size: 1.2 cm x 0.4 cm x 0.6 cm

Composition: solid/almost completely solid (2)

Echogenicity: isoechoic (1)

Shape: not taller-than-wide (0)

Margins: ill-defined (0)

Echogenic foci: none (0)

ACR TI-RADS total points: 3.

ACR TI-RADS risk category: TR3 (3 points).

ACR TI-RADS recommendations:

Nodule does not meet criteria for surveillance or biopsy

_________________________________________________________

Left lobe: 5.5 cm x 1.2 cm x 1.9 cm

Nodule # 1:

Location: Left; Superior

Size: 1.0 cm x 0.4 cm x 0.6 cm

Composition: spongiform (0)

ACR TI-RADS recommendations:

Spongiform nodule does not require surveillance or biopsy

Nodule # 2:

Location: Left; Mid

Size: 1.3 cm x 0.7 cm x 0.8 cm

Composition: mixed cystic and solid (1)

Echogenicity: isoechoic (1)

Shape: not taller-than-wide (0)

Margins: ill-defined (0)

Echogenic foci: none (0)

ACR TI-RADS total points: 2.

ACR TI-RADS risk category: TR2 (2 points).

ACR TI-RADS recommendations:

Nodule does not meet criteria for surveillance or biopsy

Nodule # 3:

Location: Left; Inferior

Size: 0.9 cm x 0.4 cm x 0.7 cm

Composition: mixed cystic and solid (1)

Echogenicity: isoechoic (1)

Shape: not taller-than-wide (0)

Margins: ill-defined (0)

Echogenic foci: none (0)

ACR TI-RADS total points: 2.

ACR TI-RADS risk category: TR2 (2 points).

ACR TI-RADS recommendations:

Nodule does not meet criteria for surveillance or biopsy.
IMPRESSION: Multinodular thyroid. Right-sided superior nodule has been
previously biopsied with benign result.

No thyroid nodule meets criteria for biopsy or surveillance, as
designated by the newly established ACR TI-RADS criteria.

Recommendations follow those established by the new ACR TI-RADS
criteria ([HOSPITAL] 1646;[DATE]).

## 2017-08-16 HISTORY — PX: BREAST BIOPSY: SHX20

## 2017-08-29 DIAGNOSIS — Z1231 Encounter for screening mammogram for malignant neoplasm of breast: Secondary | ICD-10-CM | POA: Diagnosis not present

## 2017-09-03 DIAGNOSIS — R928 Other abnormal and inconclusive findings on diagnostic imaging of breast: Secondary | ICD-10-CM | POA: Diagnosis not present

## 2017-09-03 DIAGNOSIS — R599 Enlarged lymph nodes, unspecified: Secondary | ICD-10-CM | POA: Diagnosis not present

## 2017-09-08 ENCOUNTER — Other Ambulatory Visit: Payer: Self-pay | Admitting: Radiology

## 2017-09-08 DIAGNOSIS — R928 Other abnormal and inconclusive findings on diagnostic imaging of breast: Secondary | ICD-10-CM | POA: Diagnosis not present

## 2017-09-08 DIAGNOSIS — R599 Enlarged lymph nodes, unspecified: Secondary | ICD-10-CM | POA: Diagnosis not present

## 2017-09-08 DIAGNOSIS — N6012 Diffuse cystic mastopathy of left breast: Secondary | ICD-10-CM | POA: Diagnosis not present

## 2017-09-08 DIAGNOSIS — R921 Mammographic calcification found on diagnostic imaging of breast: Secondary | ICD-10-CM | POA: Diagnosis not present

## 2017-09-12 ENCOUNTER — Encounter: Payer: Self-pay | Admitting: Obstetrics and Gynecology

## 2017-09-15 ENCOUNTER — Ambulatory Visit (INDEPENDENT_AMBULATORY_CARE_PROVIDER_SITE_OTHER): Payer: 59

## 2017-09-15 DIAGNOSIS — Z23 Encounter for immunization: Secondary | ICD-10-CM

## 2017-09-15 NOTE — Progress Notes (Signed)
Patient here for Tdap today

## 2017-09-24 DIAGNOSIS — H52223 Regular astigmatism, bilateral: Secondary | ICD-10-CM | POA: Diagnosis not present

## 2017-09-24 DIAGNOSIS — H524 Presbyopia: Secondary | ICD-10-CM | POA: Diagnosis not present

## 2017-09-24 DIAGNOSIS — H5213 Myopia, bilateral: Secondary | ICD-10-CM | POA: Diagnosis not present

## 2017-10-02 ENCOUNTER — Ambulatory Visit (INDEPENDENT_AMBULATORY_CARE_PROVIDER_SITE_OTHER): Payer: 59

## 2017-10-02 ENCOUNTER — Ambulatory Visit (INDEPENDENT_AMBULATORY_CARE_PROVIDER_SITE_OTHER): Payer: 59 | Admitting: Obstetrics and Gynecology

## 2017-10-02 ENCOUNTER — Other Ambulatory Visit: Payer: Self-pay | Admitting: *Deleted

## 2017-10-02 DIAGNOSIS — N923 Ovulation bleeding: Secondary | ICD-10-CM

## 2017-10-02 DIAGNOSIS — N939 Abnormal uterine and vaginal bleeding, unspecified: Secondary | ICD-10-CM | POA: Diagnosis not present

## 2017-10-04 ENCOUNTER — Encounter: Payer: Self-pay | Admitting: Obstetrics and Gynecology

## 2018-01-17 ENCOUNTER — Encounter: Payer: Self-pay | Admitting: Obstetrics and Gynecology

## 2018-01-21 ENCOUNTER — Encounter: Payer: Self-pay | Admitting: Family Medicine

## 2018-01-21 ENCOUNTER — Ambulatory Visit (INDEPENDENT_AMBULATORY_CARE_PROVIDER_SITE_OTHER): Payer: 59

## 2018-01-21 ENCOUNTER — Ambulatory Visit: Payer: 59 | Admitting: Family Medicine

## 2018-01-21 VITALS — BP 124/83 | HR 86 | Temp 98.0°F | Resp 12 | Ht 69.5 in

## 2018-01-21 DIAGNOSIS — R059 Cough, unspecified: Secondary | ICD-10-CM

## 2018-01-21 DIAGNOSIS — R05 Cough: Secondary | ICD-10-CM | POA: Diagnosis not present

## 2018-01-21 DIAGNOSIS — R0989 Other specified symptoms and signs involving the circulatory and respiratory systems: Secondary | ICD-10-CM

## 2018-01-21 DIAGNOSIS — J989 Respiratory disorder, unspecified: Secondary | ICD-10-CM

## 2018-01-21 MED ORDER — BENZONATATE 100 MG PO CAPS: 200.0000 mg | ORAL_CAPSULE | Freq: Two times a day (BID) | ORAL | 0 refills | Status: AC | PRN

## 2018-01-21 MED ORDER — FLUTICASONE PROPIONATE 50 MCG/ACT NA SUSP: 1.0000 | Freq: Two times a day (BID) | NASAL | 2 refills | Status: DC

## 2018-01-21 MED ORDER — ALBUTEROL SULFATE HFA 108 (90 BASE) MCG/ACT IN AERS: 2.0000 | INHALATION_SPRAY | Freq: Four times a day (QID) | RESPIRATORY_TRACT | 0 refills | Status: DC | PRN

## 2018-01-21 NOTE — Patient Instructions (Addendum)
A few things to remember from today's visit:   Cough - Plan: benzonatate (TESSALON) 100 MG capsule, DG Chest 2 View  Continue hydrocodone syrup at bedtime for cough. If cough becomes persistent, over 4 weeks we need to start looking into other possible causes.   Please be sure medication list is accurate. If a new problem present, please set up appointment sooner than planned today.

## 2018-01-21 NOTE — Progress Notes (Signed)
ACUTE VISIT  HPI:  Chief Complaint  Patient presents with  . Cough    with clear phlem that started 9 days ago    Cassie [name] is a 47 y.o.female here today complaining of 9 days of respiratory symptoms.  Productive cough with clear sputum, it seems to be worse when lying down. She completed antibiotic treatment, azithromycin, which did not help with symptoms. She has not noted chills, fever, dyspnea, wheezing, or chest pain. Occasionally she has some tightness in her chest, mainly in the morning. She has prescription for Hycodan, which she is taking daily as needed.  Cough  This is a new problem. The current episode started 1 to 4 weeks ago. The problem has been unchanged. The cough is productive of sputum. Associated symptoms include nasal congestion, postnasal drip and rhinorrhea. Pertinent negatives include no chest pain, chills, ear congestion, ear pain, eye redness, fever, headaches, heartburn, hemoptysis, myalgias, rash, sore throat, shortness of breath or wheezing. The symptoms are aggravated by lying down. She has tried prescription cough suppressant for the symptoms. The treatment provided mild relief. Her past medical history is significant for environmental allergies.    No Hx of recent travel. Her 58-year-old and husband were sick recently. No known insect bite.  Hx of allergies: History of reactive airway disease.  OTC medications for this problem: Motrin and Mucinex. Vaccination up-to-date.   Review of Systems  Constitutional: Positive for fatigue. Negative for activity change, appetite change, chills and fever.  HENT: Positive for postnasal drip and rhinorrhea. Negative for congestion, ear pain, mouth sores, sinus pain, sore throat, trouble swallowing and voice change.   Eyes: Negative for discharge, redness and itching.  Respiratory: Positive for cough. Negative for hemoptysis, shortness of breath and wheezing.   Cardiovascular: Negative for  chest pain.  Gastrointestinal: Negative for abdominal pain, diarrhea, heartburn, nausea and vomiting.  Musculoskeletal: Negative for myalgias and neck pain.  Skin: Negative for rash.  Allergic/Immunologic: Positive for environmental allergies.  Neurological: Negative for syncope, weakness and headaches.  Hematological: Negative for adenopathy. Does not bruise/bleed easily.      No current outpatient medications on file prior to visit.   No current facility-administered medications on file prior to visit.      Past Medical History:  Diagnosis Date  . Thyroid disease    thyroid nodules   No Known Allergies  Social History   Socioeconomic History  . Marital status: Married    Spouse name: Not on file  . Number of children: Not on file  . Years of education: Not on file  . Highest education level: Not on file  Occupational History  . Not on file  Social Needs  . Financial resource strain: Not on file  . Food insecurity:    Worry: Not on file    Inability: Not on file  . Transportation needs:    Medical: Not on file    Non-medical: Not on file  Tobacco Use  . Smoking status: Never Smoker  . Smokeless tobacco: Never Used  Substance and Sexual Activity  . Alcohol use: No    Alcohol/week: 0.0 standard drinks  . Drug use: No  . Sexual activity: Yes    Partners: Male    Birth control/protection: Condom  Lifestyle  . Physical activity:    Days per week: Not on file    Minutes per session: Not on file  . Stress: Not on file  Relationships  . Social connections:  Talks on phone: Not on file    Gets together: Not on file    Attends religious service: Not on file    Active member of club or organization: Not on file    Attends meetings of clubs or organizations: Not on file    Relationship status: Not on file  Other Topics Concern  . Not on file  Social History Narrative  . Not on file    Vitals:   01/21/18 1054  BP: 124/83  Pulse: 86  Resp: 12  Temp: 98  F (36.7 C)  SpO2: 96%   Body mass index is 32.02 kg/m.   Physical Exam  Nursing note and vitals reviewed. Constitutional: She is oriented to person, place, and time. She appears well-developed. She does not appear ill. No distress.  HENT:  Head: Normocephalic and atraumatic.  Right Ear: Tympanic membrane, external ear and ear canal normal.  Left Ear: Tympanic membrane, external ear and ear canal normal.  Nose: Rhinorrhea (Minimal) present.  Mouth/Throat: Oropharynx is clear and moist and mucous membranes are normal.  Mild postnasal drainage.  Eyes: Conjunctivae are normal.  Cardiovascular: Normal rate and regular rhythm.  No murmur heard. Respiratory: Effort normal. No stridor. No respiratory distress. She has no wheezes. She has no rhonchi. She has no rales.  Prolonged expiration with dry cough at the end of expiration.  Lymphadenopathy:       Head (right side): No submandibular adenopathy present.       Head (left side): No submandibular adenopathy present.    She has no cervical adenopathy.  Neurological: She is alert and oriented to person, place, and time. She has normal strength. Gait normal.  Skin: Skin is warm. No rash noted. No erythema.  Psychiatric: She has a normal mood and affect.  Well groomed, good eye contact.    ASSESSMENT AND PLAN:   Dr. 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  Diagnoses and all orders for this visit:  Cough  We discussed different etiologies, including residual symptom from recent URI, allergies, and GERD among some. She can continue Hycodan syrup at bedtime as needed. During the day she can take benzonatate. Flonase nasal spray may help with postnasal drainage and therefore with cough. Adequate hydration. Further recommendation will be given according to imaging results.  -     benzonatate (TESSALON) 100 MG capsule; Take 2 capsules (200 mg total) by mouth 2 (two) times daily as needed for up to 10 days. -     DG Chest 2 View; Future -      fluticasone (FLONASE) 50 MCG/ACT nasal spray; Place 1 spray into both nostrils 2 (two) times daily.  Reactive airway disease that is not asthma  Albuterol inh 2 puff every 6 hours for a week then as needed for wheezing or shortness of breath.  I do not think oral steroid is needed at this time but will be considered if cough is persistent. Follow-up as needed.  -     albuterol (PROVENTIL HFA;VENTOLIN HFA) 108 (90 Base) MCG/ACT inhaler; Inhale 2 puffs into the lungs every 6 (six) hours as needed for wheezing or shortness of breath.       Nirav Sweda G. [place], MD  [facility]. [place] office.

## 2018-04-01 DIAGNOSIS — Z09 Encounter for follow-up examination after completed treatment for conditions other than malignant neoplasm: Secondary | ICD-10-CM | POA: Diagnosis not present

## 2018-04-29 ENCOUNTER — Other Ambulatory Visit: Payer: Self-pay

## 2018-04-29 ENCOUNTER — Other Ambulatory Visit (HOSPITAL_COMMUNITY)
Admission: RE | Admit: 2018-04-29 | Discharge: 2018-04-29 | Disposition: A | Discharge status: Home / Self Care | Payer: 59 | Source: Ambulatory Visit | Attending: Certified Nurse Midwife | Admitting: Certified Nurse Midwife

## 2018-04-29 ENCOUNTER — Encounter: Payer: Self-pay | Admitting: Certified Nurse Midwife

## 2018-04-29 ENCOUNTER — Ambulatory Visit (INDEPENDENT_AMBULATORY_CARE_PROVIDER_SITE_OTHER): Payer: 59 | Admitting: Certified Nurse Midwife

## 2018-04-29 VITALS — BP 120/70 | HR 70 | Resp 16 | Ht 69.5 in

## 2018-04-29 DIAGNOSIS — Z124 Encounter for screening for malignant neoplasm of cervix: Secondary | ICD-10-CM | POA: Insufficient documentation

## 2018-04-29 DIAGNOSIS — Z0000 Encounter for general adult medical examination without abnormal findings: Secondary | ICD-10-CM | POA: Diagnosis not present

## 2018-04-29 DIAGNOSIS — R635 Abnormal weight gain: Secondary | ICD-10-CM

## 2018-04-29 DIAGNOSIS — Z01419 Encounter for gynecological examination (general) (routine) without abnormal findings: Secondary | ICD-10-CM | POA: Diagnosis not present

## 2018-04-29 DIAGNOSIS — E559 Vitamin D deficiency, unspecified: Secondary | ICD-10-CM | POA: Diagnosis not present

## 2018-04-29 DIAGNOSIS — B3731 Acute candidiasis of vulva and vagina: Secondary | ICD-10-CM

## 2018-04-29 DIAGNOSIS — E041 Nontoxic single thyroid nodule: Secondary | ICD-10-CM | POA: Diagnosis not present

## 2018-04-29 DIAGNOSIS — B373 Candidiasis of vulva and vagina: Secondary | ICD-10-CM | POA: Diagnosis not present

## 2018-04-29 DIAGNOSIS — R5383 Other fatigue: Secondary | ICD-10-CM | POA: Diagnosis not present

## 2018-04-29 DIAGNOSIS — N898 Other specified noninflammatory disorders of vagina: Secondary | ICD-10-CM | POA: Diagnosis not present

## 2018-04-29 MED ORDER — FLUCONAZOLE 150 MG PO TABS: ORAL_TABLET | ORAL | 0 refills | Status: DC

## 2018-04-29 MED ORDER — NYSTATIN 100000 UNIT/GM EX OINT: TOPICAL_OINTMENT | CUTANEOUS | 1 refills | Status: DC

## 2018-04-29 NOTE — Progress Notes (Signed)
48 y.o. [id number] Married  Caucasian Fe here for annual exam. Complaining of watery vaginal discharge for the past 3-4 days with musty odor. Feels irritated. No new products. Periods are improved with less cramping. Heavy day 1-2 days and then light, no issues at present. Had breast left breast biopsy in 2019 with benign finding. Nodular thyroid with endocrine follow up. No change per patient. Fatigue has increased over the past few months, busy with family and work. Eating well and has noted weight gain and plans to work on soon. No other health issues today.   Patient's last menstrual period was 04/09/2018 (exact date).          Sexually active: Yes.    The current method of family planning is condoms  Exercising: Yes.    walking Smoker:  no  Review of Systems  Constitutional: Negative.   HENT: Negative.   Eyes: Negative.   Respiratory: Negative.   Cardiovascular: Negative.   Gastrointestinal: Negative.   Genitourinary: Negative.   Musculoskeletal: Negative.   Skin:       Vaginal itching  Neurological: Negative.   Endo/Heme/Allergies: Negative.   Psychiatric/Behavioral: Negative.     Health Maintenance: Pap:  06-29-14 neg HPV HR neg, 09-25-16 neg History of Abnormal Pap: no MMG:  Breast biopsy 6/19 Self Breast exams: yes Colonoscopy:  none BMD:   none TDaP:  2019 Shingles: no Pneumonia: no Hep C and HIV: both neg 2017 Labs: yes   reports that she has never smoked. She has never used smokeless tobacco. She reports that she does not drink alcohol or use drugs.  Past Medical History:  Diagnosis Date  . Thyroid disease    thyroid nodules    Past Surgical History:  Procedure Laterality Date  . BREAST BIOPSY  08/2017   left breast  . ivf     . polpys      No current outpatient medications on file.   No current facility-administered medications for this visit.     Family History  Problem Relation Age of Onset  . Cancer Mother   . Cancer Maternal Aunt     ROS:   Pertinent items are noted in HPI.  Otherwise, a comprehensive ROS was negative.  Exam:   BP 120/70   Pulse 70   Resp 16   Ht 5' 9.5" (1.765 m)   LMP 04/09/2018 (Exact Date)   BMI 32.02 kg/m  Height: 5' 9.5" (176.5 cm) Ht Readings from Last 3 Encounters:  04/29/18 5' 9.5" (1.765 m)  01/21/18 5' 9.5" (1.765 m)  09/25/16 5' 9.5" (1.765 m)    General appearance: alert, cooperative and appears stated age Head: Normocephalic, without obvious abnormality, atraumatic Neck: no adenopathy, supple, symmetrical, trachea midline and thyroid nodular and no change in size with [place] comparison Lungs: clear to auscultation bilaterally Breasts: normal appearance, no masses or tenderness, No nipple retraction or dimpling, No nipple discharge or bleeding, No axillary or supraclavicular adenopathy, left breast biopsy scar noted Heart: regular rate and rhythm Abdomen: soft, non-tender; no masses,  no organomegaly Extremities: extremities normal, atraumatic, no cyanosis or edema Skin: Skin color, texture, turgor normal. No rashes or lesions Lymph nodes: Cervical, supraclavicular, and axillary nodes normal. No abnormal inguinal nodes palpated Neurologic: Grossly normal   Pelvic: External genitalia:  no lesions, redness noted on right and left vulva, no rash or papules              Urethra:  normal appearing urethra with no masses, tenderness or lesions  Bartholin's and Skene's: normal                 Vagina: normal appearing vagina with normal color, watery clear musty odorous discharge, no lesions. Affirm taken, wet prep taken              Cervix: multiparous appearance, no cervical motion tenderness and no lesions              Pap taken: Yes.   Bimanual Exam:  Uterus:  normal size, contour, position, consistency, mobility, non-tender              Adnexa: normal adnexa and no mass, fullness, tenderness               Rectovaginal: Confirms               Anus:  normal sphincter tone, no  lesions, small known hemorrhoid, not thrombosed  Wet prep: KOH, Saline + yeast  ? Clue cell noted  Chaperone present: yes  A:  Well Woman with normal exam  Contraception condoms  Yeast vaginitis  History of left breast calcifications/biopsy benign  History of nodular thyroid, benign biopsy with Endocrine management  Fatigue, history of Vitamin D deficiency  Weight gain  Screening labs  Colonoscopy due  P:   Reviewed health and wellness pertinent to exam  Discussed wet prep finding and will treat with Diflucan and Nystatin ointment for comfort and itching. Discussed Aveeno sitz bath for comfort and restore normal ph.  Rx Diflucan see order with instructions  Rx Nystatin ointment see order with instructions, mix with OTC hydrocortisone or her betamethasone cream and apply thinly to vulva bid .  Discussed eating better diet can also help with fatigue and weight management. Will check Vitamin D which can also contribute.  Labs: CBC,Ferritin, CMP, Lipid panel,TSH,Vitamin D  Discussed risks/benefits of colonoscopy and cologard. Would like to do Cologard. She will be sent information once order placed.  Pap smear: yes   counseled on breast self exam, mammography screening, feminine hygiene, adequate intake of calcium and vitamin D, diet and exercise  return annually or prn  An After Visit Summary was printed and given to the patient.

## 2018-04-30 ENCOUNTER — Other Ambulatory Visit: Payer: Self-pay | Admitting: Certified Nurse Midwife

## 2018-04-30 DIAGNOSIS — B3731 Acute candidiasis of vulva and vagina: Secondary | ICD-10-CM

## 2018-04-30 DIAGNOSIS — B373 Candidiasis of vulva and vagina: Secondary | ICD-10-CM

## 2018-04-30 LAB — VAGINITIS/VAGINOSIS, DNA PROBE
Candida Species: POSITIVE — AB
Gardnerella vaginalis: NEGATIVE
Trichomonas vaginosis: NEGATIVE

## 2018-04-30 MED ORDER — TERCONAZOLE 0.4 % VA CREA: TOPICAL_CREAM | VAGINAL | 1 refills | Status: DC

## 2018-04-30 NOTE — Progress Notes (Signed)
Patient notified of Affirm results of positive yeast and negative BV and trichomonas. Rx Terazol 7 vaginal cream sent to pharmacy with instructions.

## 2018-05-04 ENCOUNTER — Encounter: Payer: Self-pay | Admitting: Certified Nurse Midwife

## 2018-05-04 LAB — CYTOLOGY - PAP
Diagnosis: NEGATIVE
HPV (WINDOPATH): NOT DETECTED

## 2018-05-07 ENCOUNTER — Encounter (INDEPENDENT_AMBULATORY_CARE_PROVIDER_SITE_OTHER): Payer: 59

## 2018-05-11 ENCOUNTER — Encounter (INDEPENDENT_AMBULATORY_CARE_PROVIDER_SITE_OTHER): Payer: 59

## 2018-05-20 ENCOUNTER — Encounter (INDEPENDENT_AMBULATORY_CARE_PROVIDER_SITE_OTHER): Payer: Self-pay | Admitting: Family Medicine

## 2018-05-20 ENCOUNTER — Ambulatory Visit (INDEPENDENT_AMBULATORY_CARE_PROVIDER_SITE_OTHER): Payer: 59 | Admitting: Family Medicine

## 2018-05-20 VITALS — BP 104/71 | HR 67 | Temp 97.9°F | Ht 69.0 in | Wt 236.0 lb

## 2018-05-20 DIAGNOSIS — Z6835 Body mass index (BMI) 35.0-35.9, adult: Secondary | ICD-10-CM | POA: Diagnosis not present

## 2018-05-20 DIAGNOSIS — R5383 Other fatigue: Secondary | ICD-10-CM

## 2018-05-20 DIAGNOSIS — E559 Vitamin D deficiency, unspecified: Secondary | ICD-10-CM | POA: Diagnosis not present

## 2018-05-20 DIAGNOSIS — Z1331 Encounter for screening for depression: Secondary | ICD-10-CM

## 2018-05-20 DIAGNOSIS — Z9189 Other specified personal risk factors, not elsewhere classified: Secondary | ICD-10-CM

## 2018-05-21 LAB — COMPREHENSIVE METABOLIC PANEL
A/G RATIO: 1.4 (ref 1.2–2.2)
ALT: 11 IU/L (ref 0–32)
AST: 13 IU/L (ref 0–40)
Albumin: 4.2 g/dL (ref 3.8–4.8)
Alkaline Phosphatase: 91 IU/L (ref 39–117)
BUN/Creatinine Ratio: 17 (ref 9–23)
BUN: 12 mg/dL (ref 6–24)
Bilirubin Total: 0.4 mg/dL (ref 0.0–1.2)
CO2: 22 mmol/L (ref 20–29)
Calcium: 9.4 mg/dL (ref 8.7–10.2)
Chloride: 104 mmol/L (ref 96–106)
Creatinine, Ser: 0.7 mg/dL (ref 0.57–1.00)
GFR calc Af Amer: 120 mL/min/{1.73_m2} (ref >59)
GFR calc non Af Amer: 104 mL/min/{1.73_m2} (ref >59)
Globulin, Total: 3.1 g/dL (ref 1.5–4.5)
Glucose: 87 mg/dL (ref 65–99)
Potassium: 4.4 mmol/L (ref 3.5–5.2)
Sodium: 142 mmol/L (ref 134–144)
Total Protein: 7.3 g/dL (ref 6.0–8.5)

## 2018-05-21 LAB — VITAMIN D 25 HYDROXY (VIT D DEFICIENCY, FRACTURES): Vit D, 25-Hydroxy: 25.1 ng/mL — ABNORMAL LOW (ref 30.0–100.0)

## 2018-05-21 LAB — HEMOGLOBIN A1C
ESTIMATED AVERAGE GLUCOSE: 117 mg/dL
Hgb A1c MFr Bld: 5.7 % — ABNORMAL HIGH (ref 4.8–5.6)

## 2018-05-21 LAB — CBC WITH DIFFERENTIAL
Basophils Absolute: 0 10*3/uL (ref 0.0–0.2)
Basos: 1 %
EOS (ABSOLUTE): 0.1 10*3/uL (ref 0.0–0.4)
Eos: 1 %
Hematocrit: 39.3 % (ref 34.0–46.6)
Hemoglobin: 13.5 g/dL (ref 11.1–15.9)
Immature Grans (Abs): 0 10*3/uL (ref 0.0–0.1)
Immature Granulocytes: 0 %
Lymphocytes Absolute: 2.9 10*3/uL (ref 0.7–3.1)
Lymphs: 38 %
MCH: 32.3 pg (ref 26.6–33.0)
MCHC: 34.4 g/dL (ref 31.5–35.7)
MCV: 94 fL (ref 79–97)
Monocytes Absolute: 0.4 10*3/uL (ref 0.1–0.9)
Monocytes: 6 %
NEUTROS PCT: 54 %
Neutrophils Absolute: 4.1 10*3/uL (ref 1.4–7.0)
RBC: 4.18 x10E6/uL (ref 3.77–5.28)
RDW: 12.5 % (ref 11.7–15.4)
WBC: 7.6 10*3/uL (ref 3.4–10.8)

## 2018-05-21 LAB — INSULIN, RANDOM: INSULIN: 5.8 u[IU]/mL (ref 2.6–24.9)

## 2018-05-21 LAB — LIPID PANEL WITH LDL/HDL RATIO
Cholesterol, Total: 170 mg/dL (ref 100–199)
HDL: 50 mg/dL (ref >39)
LDL Calculated: 107 mg/dL — ABNORMAL HIGH (ref 0–99)
LDl/HDL Ratio: 2.1 ratio (ref 0.0–3.2)
Triglycerides: 63 mg/dL (ref 0–149)
VLDL Cholesterol Cal: 13 mg/dL (ref 5–40)

## 2018-05-21 LAB — TSH: TSH: 0.556 u[IU]/mL (ref 0.450–4.500)

## 2018-05-21 LAB — VITAMIN B12: Vitamin B-12: 675 pg/mL (ref 232–1245)

## 2018-05-21 LAB — T3: T3, Total: 114 ng/dL (ref 71–180)

## 2018-05-21 LAB — T4, FREE: Free T4: 1.05 ng/dL (ref 0.82–1.77)

## 2018-05-21 LAB — FOLATE: Folate: 11.8 ng/mL (ref >3.0)

## 2018-05-21 NOTE — Progress Notes (Signed)
.  Office: [phone]  /  Fax: [phone]   HPI:   Chief Complaint: OBESITY  Cassie [name] (MR# [id number]) is a 48 y.o. female who presents on 05/20/2018 for obesity evaluation and treatment. Current BMI is Body mass index is 34.85 kg/m.Cassie [name] has struggled with obesity for years and has been unsuccessful in either losing weight or maintaining long term weight loss. Cassie [name] attended our information session and states she is currently in the action stage of change and ready to dedicate time achieving and maintaining a healthier weight.   Cassie [name] states her family eats meals together she thinks her family will eat healthier with  her her desired weight loss is 56 lbs she started gaining weight during residency and afterwards her heaviest weight ever was 240 lbs she has significant food cravings issues  she snacks frequently in the evenings she skips meals frequently she is frequently drinking liquids with calories she frequently makes poor food choices she frequently eats larger portions than normal  she struggles with emotional eating    Fatigue Cassie [name] feels her energy is lower than it should be. This has worsened with weight gain and has not worsened recently. Uva admits to daytime somnolence and  admits to waking up still tired. Patient is at risk for obstructive sleep apnea. Patent has a history of symptoms of daytime fatigue. Patient generally gets 6 hours of sleep per night, and states they generally have nightime awakenings. Snoring is not present. Apneic episodes are not present. Epworth Sleepiness Score is 8.  Vitamin D Deficiency Cassie [name] has a diagnosis of vitamin D deficiency. She is not currently taking Vit D, and she has no recent labs. She notes fatigue and denies nausea, vomiting or muscle weakness.  Depression Screen Cassie [name]'s Food and Mood (modified PHQ-9) score was  Depression screen PHQ 2/9 05/20/2018  Decreased Interest 1  Down, Depressed, Hopeless 1  PHQ - 2 Score 2    Altered sleeping 1  Tired, decreased energy 3  Change in appetite 2  Feeling bad or failure about yourself  1  Trouble concentrating 0  Moving slowly or fidgety/restless 0  Suicidal thoughts 0  PHQ-9 Score 9  Difficult doing work/chores Not difficult at all   At risk for cardiovascular disease Cassie [name] is at a higher than average risk for cardiovascular disease due to obesity. She currently denies any chest pain.  ASSESSMENT AND PLAN:  Other fatigue - Plan: EKG 12-Lead, Vitamin B12, CBC With Differential, Comprehensive metabolic panel, Folate, Hemoglobin A1c, Insulin, random, Lipid Panel With LDL/HDL Ratio, T3, T4, free, TSH  Vitamin D deficiency - Plan: VITAMIN D 25 Hydroxy (Vit-D Deficiency, Fractures)  Depression screening  At risk for heart disease  Class 2 severe obesity with serious comorbidity and body mass index (BMI) of 35.0 to 35.9 in adult, unspecified obesity type (HCC)  PLAN:  Fatigue Cassie [name] was informed that her fatigue may be related to obesity, depression or many other causes. Labs will be ordered, and in the meanwhile Cassie [name] has agreed to work on diet, exercise and weight loss to help with fatigue. Proper sleep hygiene was discussed including the need for 7-8 hours of quality sleep each night. A sleep study was not ordered based on symptoms and Epworth score.  Vitamin D Deficiency Cassie [name] was informed that low vitamin D levels contributes to fatigue and are associated with obesity, breast, and colon cancer. She will follow up for routine testing of vitamin D, at least 2-3 times per year. She was informed  of the risk of over-replacement of vitamin D and agrees to not increase her dose unless she discusses this with [place] first. We will check labs today. Cassie [name] agree to follow up with our clinic in 2 weeks.  Depression Screen Cassie [name] had a mildly positive depression screening. Depression is commonly associated with obesity and often results in emotional eating behaviors. We will  monitor this closely and work on CBT to help improve the non-hunger eating patterns. Referral to Psychology may be required if no improvement is seen as she continues in our clinic.  Cardiovascular risk counseling Cassie [name] was given extended (15 minutes) coronary artery disease prevention counseling today. She is 48 y.o. female and has risk factors for heart disease including obesity. We discussed intensive lifestyle modifications today with an emphasis on specific weight loss instructions and strategies. Pt was also informed of the importance of increasing exercise and decreasing saturated fats to help prevent heart disease.  Obesity Cassie [name] is currently in the action stage of change and her goal is to continue with weight loss efforts She has agreed to follow the Category 3 plan Cassie [name] has been instructed to work up to a goal of 150 minutes of combined cardio and strengthening exercise per week for weight loss and overall health benefits. We discussed the following Behavioral Modification Strategies today: increasing lean protein intake, decreasing simple carbohydrates , decrease eating out and work on meal planning and easy cooking plans  Cassie [name] has agreed to follow up with our clinic in 2 weeks. She was informed of the importance of frequent follow up visits to maximize her success with intensive lifestyle modifications for her multiple health conditions. She was informed we would discuss her lab results at her next visit unless there is a critical issue that needs to be addressed sooner. Cassie [name] agreed to keep her next visit at the agreed upon time to discuss these results.  ALLERGIES: No Known Allergies  MEDICATIONS: No current outpatient medications on file prior to visit.   No current facility-administered medications on file prior to visit.     PAST MEDICAL HISTORY: Past Medical History:  Diagnosis Date  . Infertility, female   . Thyroid disease    thyroid nodules    PAST SURGICAL  HISTORY: Past Surgical History:  Procedure Laterality Date  . BREAST BIOPSY  08/2017   left breast  . ivf     . KNEE ARTHROSCOPY    . polpys      SOCIAL HISTORY: Social History   Tobacco Use  . Smoking status: Never Smoker  . Smokeless tobacco: Never Used  Substance Use Topics  . Alcohol use: No    Alcohol/week: 0.0 standard drinks  . Drug use: No    FAMILY HISTORY: Family History  Problem Relation Age of Onset  . Cancer Mother   . Cancer Maternal Aunt   . Diabetes Father   . Hypertension Father     ROS: Review of Systems  Constitutional: Positive for malaise/fatigue. Negative for weight loss.  Cardiovascular: Negative for chest pain.  Gastrointestinal: Negative for nausea and vomiting.  Musculoskeletal:       Negative muscle weakness  Neurological: Positive for headaches (Occasionally).    PHYSICAL EXAM: Blood pressure 104/71, pulse 67, temperature 97.9 F (36.6 C), temperature source Oral, height 5\' 9"  (1.753 m), weight 236 lb (107 kg), last menstrual period 05/15/2018, SpO2 97 %, unknown if currently breastfeeding. Body mass index is 34.85 kg/m. Physical Exam Vitals signs reviewed.  Constitutional:  Appearance: Normal appearance. She is obese.  HENT:     Head: Normocephalic and atraumatic.     Nose: Nose normal.  Eyes:     General: No scleral icterus.    Extraocular Movements: Extraocular movements intact.  Neck:     Musculoskeletal: Normal range of motion and neck supple.     Comments: No thyromegaly present Cardiovascular:     Rate and Rhythm: Normal rate and regular rhythm.     Pulses: Normal pulses.     Heart sounds: Normal heart sounds.  Pulmonary:     Effort: Pulmonary effort is normal. No respiratory distress.     Breath sounds: Normal breath sounds.  Abdominal:     Palpations: Abdomen is soft.     Tenderness: There is no abdominal tenderness.     Comments: + Obesity  Musculoskeletal: Normal range of motion.     Right lower leg: No  edema.     Left lower leg: No edema.  Skin:    General: Skin is warm and dry.  Neurological:     Mental Status: She is alert and oriented to person, place, and time.     Coordination: Coordination normal.  Psychiatric:        Mood and Affect: Mood normal.        Behavior: Behavior normal.     RECENT LABS AND TESTS: BMET    Component Value Date/Time   NA 142 05/20/2018 1403   K 4.4 05/20/2018 1403   CL 104 05/20/2018 1403   CO2 22 05/20/2018 1403   GLUCOSE 87 05/20/2018 1403   GLUCOSE 93 06/29/2014 1011   BUN 12 05/20/2018 1403   CREATININE 0.70 05/20/2018 1403   CREATININE 0.60 06/29/2014 1011   CALCIUM 9.4 05/20/2018 1403   GFRNONAA 104 05/20/2018 1403   GFRAA 120 05/20/2018 1403   Lab Results  Component Value Date   HGBA1C 5.7 (H) 05/20/2018   Lab Results  Component Value Date   INSULIN 5.8 05/20/2018   CBC    Component Value Date/Time   WBC 7.6 05/20/2018 1403   WBC 8.6 06/29/2014 1011   RBC 4.18 05/20/2018 1403   RBC 4.25 06/29/2014 1011   HGB 13.5 05/20/2018 1403   HCT 39.3 05/20/2018 1403   PLT 252 09/25/2016 1336   MCV 94 05/20/2018 1403   MCH 32.3 05/20/2018 1403   MCH 31.3 06/29/2014 1011   MCHC 34.4 05/20/2018 1403   MCHC 33.9 06/29/2014 1011   RDW 12.5 05/20/2018 1403   LYMPHSABS 2.9 05/20/2018 1403   MONOABS 0.7 06/29/2014 1011   EOSABS 0.1 05/20/2018 1403   BASOSABS 0.0 05/20/2018 1403   Iron/TIBC/Ferritin/ %Sat    Component Value Date/Time   IRON 149 09/25/2016 1336   TIBC 277 09/25/2016 1336   FERRITIN 32 09/25/2016 1336   IRONPCTSAT 54 09/25/2016 1336   Lipid Panel     Component Value Date/Time   CHOL 170 05/20/2018 1403   TRIG 63 05/20/2018 1403   HDL 50 05/20/2018 1403   CHOLHDL 3.2 06/29/2014 1011   VLDL 13 06/29/2014 1011   LDLCALC 107 (H) 05/20/2018 1403   Hepatic Function Panel     Component Value Date/Time   PROT 7.3 05/20/2018 1403   ALBUMIN 4.2 05/20/2018 1403   AST 13 05/20/2018 1403   ALT 11 05/20/2018 1403    ALKPHOS 91 05/20/2018 1403   BILITOT 0.4 05/20/2018 1403      Component Value Date/Time   TSH WILL FOLLOW 05/20/2018 1403  Vitamin D No recent labs  ECG  shows NSR with a rate of 70 BPM INDIRECT CALORIMETER done today shows a VO2 of 286 and a REE of 1989. Her calculated basal metabolic rate is 3762 thus her basal metabolic rate is unchanged than expected.       OBESITY BEHAVIORAL INTERVENTION VISIT  Today's visit was # 1   Starting weight: 236 lbs Starting date: 05/20/2018 Today's weight : 236 lbs  Today's date: 05/20/2018 Total lbs lost to date: 0    05/20/2018  Height 5\' 9"  (1.753 m)  Weight 236 lb (107 kg)  BMI (Calculated) 34.84  BLOOD PRESSURE - SYSTOLIC 831  BLOOD PRESSURE - DIASTOLIC 71  Waist Measurement  45 inches   Body Fat % 44.4 %  Total Body Water (lbs) 87.2 lbs  RMR 1989     ASK: We discussed the diagnosis of obesity with Cassie [name] today and Cassie [name] agreed to give [place] permission to discuss obesity behavioral modification therapy today.  ASSESS: Cassie [name] has the diagnosis of obesity and her BMI today is 34.84 Terecia is in the action stage of change   ADVISE: Cassie [name] was educated on the multiple health risks of obesity as well as the benefit of weight loss to improve her health. She was advised of the need for long term treatment and the importance of lifestyle modifications to improve her current health and to decrease her risk of future health problems.  AGREE: Multiple dietary modification options and treatment options were discussed and  Cassie [name] agreed to follow the recommendations documented in the above note.  ARRANGE: Cassie [name] was educated on the importance of frequent visits to treat obesity as outlined per CMS and USPSTF guidelines and agreed to schedule her next follow up appointment today.   I, Trixie Dredge, am acting as transcriptionist for Dennard Nip, MD  I have reviewed the above documentation for accuracy and completeness, and I agree  with the above. -Dennard Nip, MD

## 2018-06-03 ENCOUNTER — Ambulatory Visit (INDEPENDENT_AMBULATORY_CARE_PROVIDER_SITE_OTHER): Payer: 59 | Admitting: Family Medicine

## 2018-06-03 ENCOUNTER — Encounter (INDEPENDENT_AMBULATORY_CARE_PROVIDER_SITE_OTHER): Payer: Self-pay | Admitting: Family Medicine

## 2018-06-03 VITALS — BP 125/82 | HR 86 | Ht 69.0 in | Wt 232.0 lb

## 2018-06-03 DIAGNOSIS — R7303 Prediabetes: Secondary | ICD-10-CM | POA: Diagnosis not present

## 2018-06-03 DIAGNOSIS — E669 Obesity, unspecified: Secondary | ICD-10-CM | POA: Diagnosis not present

## 2018-06-03 DIAGNOSIS — Z9189 Other specified personal risk factors, not elsewhere classified: Secondary | ICD-10-CM | POA: Diagnosis not present

## 2018-06-03 DIAGNOSIS — Z6834 Body mass index (BMI) 34.0-34.9, adult: Secondary | ICD-10-CM

## 2018-06-03 DIAGNOSIS — E559 Vitamin D deficiency, unspecified: Secondary | ICD-10-CM

## 2018-06-03 MED ORDER — VITAMIN D (ERGOCALCIFEROL) 1.25 MG (50000 UNIT) PO CAPS: 50000.0000 [IU] | ORAL_CAPSULE | ORAL | 0 refills | Status: DC

## 2018-06-03 NOTE — Progress Notes (Signed)
Office: [phone]  /  Fax: [phone]   HPI:   Chief Complaint: OBESITY Cassie [name] is here to discuss her progress with her obesity treatment plan. She is on the Category 3 plan and is following her eating plan approximately 95 % of the time. She states she is using the elliptical 30 to 40 minutes 3 times per week. Cassie [name] did very well with the Category 3 plan. Her hunger was mostly controlled and she liked most of her food options. She did feel it was difficult to eat all of her food.  Her weight is 232 lb (105.2 kg) today and has had a weight loss of 4 pounds over a period of 2 weeks since her last visit. She has lost 4 lbs since starting treatment with [place].  Pre-Diabetes Cassie [name] has a diagnosis of pre-diabetes based on her elevated Hgb A1c of  5.7 on 05/20/18.Cassie [name] Her fasting Insulin was 5.8 and lower than expected for pre-diabetes, suggesting that she has already burnt out some of her beta cells. She was informed this puts her at greater risk of developing diabetes. She noted decreased polyphagia on her diet prescription. She is not taking metformin currently and continues to work on diet and exercise to decrease risk of diabetes.   At risk for diabetes Cassie [name] is at higher than average risk for developing diabetes due to her pre-diabetes and obesity. She currently denies polyuria or polydipsia.  Vitamin D Deficiency Cassie [name] has a diagnosis of vitamin D deficiency. She is currently stable on vit D, but is not yet at goal. Cassie [name] denies nausea, vomiting, or muscle weakness.  ASSESSMENT AND PLAN:  Prediabetes  Vitamin D deficiency - Plan: Vitamin D, Ergocalciferol, (DRISDOL) 1.25 MG (50000 UT) CAPS capsule  At risk for diabetes mellitus  Class 1 obesity with serious comorbidity and body mass index (BMI) of 34.0 to 34.9 in adult, unspecified obesity type  PLAN:  Pre-Diabetes Cassie [name] will continue to work on weight loss, exercise, and decreasing simple carbohydrates in her diet to help decrease the risk  of diabetes. She was informed that eating too many simple carbohydrates or too many calories at one sitting increases the likelihood of GI side effects. Cassie [name] agreed to defer metformin for now and a prescription was not written today. Cassie [name] agreed to follow up with [place] as directed to monitor her progress in 2 weeks.  Diabetes risk counseling Cassie [name] was given extended (30 minutes) diabetes prevention counseling today. She is 48 y.o. female and has risk factors for diabetes including pre-diabetes and obesity. We discussed intensive lifestyle modifications today with an emphasis on weight loss as well as increasing exercise and decreasing simple carbohydrates in her diet.  Vitamin D Deficiency Cassie [name] was informed that low vitamin D levels contribute to fatigue and are associated with obesity, breast, and colon cancer. Cassie [name] agrees to start to take prescription Vit D @50 ,000 IU every week #4 with no refills and will follow up for routine testing of vitamin D, at least 2-3 times per year. She was informed of the risk of over-replacement of vitamin D and agrees to not increase her dose unless she discusses this with [place] first. We will recheck labs in 3 months. Cassie [name] agrees to follow up in 2 weeks as directed.  Obesity Cassie [name] is currently in the action stage of change. As such, her goal is to continue with weight loss efforts. She has agreed to follow the Category 3 plan. Cassie [name] has been instructed to work up to a goal of 150  minutes of combined cardio and strengthening exercise per week for weight loss and overall health benefits. We discussed the following Behavioral Modification Strategies today: increasing lean protein intake, decreasing simple carbohydrates, emotional eating strategies, keeping healthy food in the home, better snacking choices, ways to avoid boredom eating, and ways to avoid night time snacking.  Cassie [name] has agreed to follow up with our clinic in 2 weeks. She was informed of the importance of frequent  follow up visits to maximize her success with intensive lifestyle modifications for her multiple health conditions.  ALLERGIES: No Known Allergies  MEDICATIONS: No current outpatient medications on file prior to visit.   No current facility-administered medications on file prior to visit.     PAST MEDICAL HISTORY: Past Medical History:  Diagnosis Date   Infertility, female    Thyroid disease    thyroid nodules    PAST SURGICAL HISTORY: Past Surgical History:  Procedure Laterality Date   BREAST BIOPSY  08/2017   left breast   ivf      KNEE ARTHROSCOPY     polpys      SOCIAL HISTORY: Social History   Tobacco Use   Smoking status: Never Smoker   Smokeless tobacco: Never Used  Substance Use Topics   Alcohol use: No    Alcohol/week: 0.0 standard drinks   Drug use: No    FAMILY HISTORY: Family History  Problem Relation Age of Onset   Cancer Mother    Cancer Maternal Aunt    Diabetes Father    Hypertension Father    ROS: Review of Systems  Constitutional: Positive for weight loss.  Gastrointestinal: Negative for nausea and vomiting.  Genitourinary:       Negative for polyuria.  Musculoskeletal:       Negative for muscle weakness.  Endo/Heme/Allergies: Negative for polydipsia.       Positive for polyphagia.   PHYSICAL EXAM: Blood pressure 125/82, pulse 86, height 5\' 9"  (1.753 m), weight 232 lb (105.2 kg), last menstrual period 05/15/2018, SpO2 97 %, unknown if currently breastfeeding. Body mass index is 34.26 kg/m. Physical Exam Vitals signs reviewed.  Constitutional:      Appearance: Normal appearance. She is obese.  Cardiovascular:     Rate and Rhythm: Normal rate.  Pulmonary:     Effort: Pulmonary effort is normal.  Musculoskeletal: Normal range of motion.  Skin:    General: Skin is warm and dry.  Neurological:     Mental Status: She is alert and oriented to person, place, and time.  Psychiatric:        Mood and Affect: Mood  normal.        Behavior: Behavior normal.    RECENT LABS AND TESTS: BMET    Component Value Date/Time   NA 142 05/20/2018 1403   K 4.4 05/20/2018 1403   CL 104 05/20/2018 1403   CO2 22 05/20/2018 1403   GLUCOSE 87 05/20/2018 1403   GLUCOSE 93 06/29/2014 1011   BUN 12 05/20/2018 1403   CREATININE 0.70 05/20/2018 1403   CREATININE 0.60 06/29/2014 1011   CALCIUM 9.4 05/20/2018 1403   GFRNONAA 104 05/20/2018 1403   GFRAA 120 05/20/2018 1403   Lab Results  Component Value Date   HGBA1C 5.7 (H) 05/20/2018   HGBA1C 5.4 09/25/2016   HGBA1C 5.8 (H) 06/29/2014   Lab Results  Component Value Date   INSULIN 5.8 05/20/2018   CBC    Component Value Date/Time   WBC 7.6 05/20/2018 1403   WBC 8.6  06/29/2014 1011   RBC 4.18 05/20/2018 1403   RBC 4.25 06/29/2014 1011   HGB 13.5 05/20/2018 1403   HCT 39.3 05/20/2018 1403   PLT 252 09/25/2016 1336   MCV 94 05/20/2018 1403   MCH 32.3 05/20/2018 1403   MCH 31.3 06/29/2014 1011   MCHC 34.4 05/20/2018 1403   MCHC 33.9 06/29/2014 1011   RDW 12.5 05/20/2018 1403   LYMPHSABS 2.9 05/20/2018 1403   MONOABS 0.7 06/29/2014 1011   EOSABS 0.1 05/20/2018 1403   BASOSABS 0.0 05/20/2018 1403   Iron/TIBC/Ferritin/ %Sat    Component Value Date/Time   IRON 149 09/25/2016 1336   TIBC 277 09/25/2016 1336   FERRITIN 32 09/25/2016 1336   IRONPCTSAT 54 09/25/2016 1336   Lipid Panel     Component Value Date/Time   CHOL 170 05/20/2018 1403   TRIG 63 05/20/2018 1403   HDL 50 05/20/2018 1403   CHOLHDL 3.2 06/29/2014 1011   VLDL 13 06/29/2014 1011   LDLCALC 107 (H) 05/20/2018 1403   Hepatic Function Panel     Component Value Date/Time   PROT 7.3 05/20/2018 1403   ALBUMIN 4.2 05/20/2018 1403   AST 13 05/20/2018 1403   ALT 11 05/20/2018 1403   ALKPHOS 91 05/20/2018 1403   BILITOT 0.4 05/20/2018 1403      Component Value Date/Time   TSH 0.556 05/20/2018 1403   TSH 0.716 09/25/2016 1336   TSH 0.48 01/24/2016 1122   Results for DONNER,  Cassie SUZANNE "Vinnie Level" (MRN [id number]) as of 06/03/2018 14:08  Ref. Range 05/20/2018 14:03  Vitamin D, 25-Hydroxy Latest Ref Range: 30.0 - 100.0 ng/mL 25.1 (L)   OBESITY BEHAVIORAL INTERVENTION VISIT  Today's visit was # 2  Starting weight: 236 lbs Starting date: 05/20/18 Today's weight : Weight: 232 lb (105.2 kg)  Today's date: 06/03/2018 Total lbs lost to date: 4    06/03/2018  Height 5\' 9"  (1.753 m)  Weight 232 lb (105.2 kg)  BMI (Calculated) 34.24  BLOOD PRESSURE - SYSTOLIC 397  BLOOD PRESSURE - DIASTOLIC 82   Body Fat % 67.3 %  Total Body Water (lbs) 88.6 lbs   ASK: We discussed the diagnosis of obesity with Lyman Speller today and Cassie [name] agreed to give [place] permission to discuss obesity behavioral modification therapy today.  ASSESS: Karlynn has the diagnosis of obesity and her BMI today is 34.24. Evany is in the action stage of change.   ADVISE: Lamaya was educated on the multiple health risks of obesity as well as the benefit of weight loss to improve her health. She was advised of the need for long term treatment and the importance of lifestyle modifications to improve her current health and to decrease her risk of future health problems.  AGREE: Multiple dietary modification options and treatment options were discussed and Terianne agreed to follow the recommendations documented in the above note.  ARRANGE: Scheryl was educated on the importance of frequent visits to treat obesity as outlined per CMS and USPSTF guidelines and agreed to schedule her next follow up appointment today.  IMarcille Blanco, CMA, am acting as transcriptionist for Starlyn Skeans, MD  I have reviewed the above documentation for accuracy and completeness, and I agree with the above. -Dennard Nip, MD

## 2018-06-09 ENCOUNTER — Encounter (INDEPENDENT_AMBULATORY_CARE_PROVIDER_SITE_OTHER): Payer: Self-pay

## 2018-06-14 DIAGNOSIS — M79641 Pain in right hand: Secondary | ICD-10-CM | POA: Diagnosis not present

## 2018-06-16 DIAGNOSIS — M79641 Pain in right hand: Secondary | ICD-10-CM | POA: Diagnosis not present

## 2018-06-17 ENCOUNTER — Ambulatory Visit (INDEPENDENT_AMBULATORY_CARE_PROVIDER_SITE_OTHER): Payer: 59 | Admitting: Family Medicine

## 2018-06-17 ENCOUNTER — Other Ambulatory Visit: Payer: Self-pay

## 2018-06-17 ENCOUNTER — Encounter (INDEPENDENT_AMBULATORY_CARE_PROVIDER_SITE_OTHER): Payer: Self-pay | Admitting: Family Medicine

## 2018-06-17 DIAGNOSIS — E669 Obesity, unspecified: Secondary | ICD-10-CM | POA: Diagnosis not present

## 2018-06-17 DIAGNOSIS — E559 Vitamin D deficiency, unspecified: Secondary | ICD-10-CM

## 2018-06-17 DIAGNOSIS — Z6834 Body mass index (BMI) 34.0-34.9, adult: Secondary | ICD-10-CM

## 2018-06-17 NOTE — Progress Notes (Signed)
Office: [phone]  /  Fax: [phone] TeleHealth Visit:  Cassie [name] has consented to this TeleHealth visit today via telephone due to WebEx technical difficulties. The patient is located at home, the provider is located at the [facility] office. The participants in this visit include the listed provider and patient.   HPI:   Chief Complaint: OBESITY Cassie [name] is here to discuss her progress with her obesity treatment plan. She is on the Category 3 plan and is following her eating plan approximately 80 % of the time. She states she has been active and doing yard work since her last visit. Webex was not available for the visit, so this visit was over the telephone. [name] continues to do well with her Category 3 plan. She is making some substitutions, but these are appropriate. Her hunger is controlled and she is active and playing with her son while on [place].  We were unable to weigh the patient today for this TeleHealth visit. She feels as if she has lost weight since her last visit. She has lost 4 lbs since starting treatment with [place].  Vitamin D Deficiency [name] has a diagnosis of vitamin D deficiency. She is currently stable on vit D. [name] denies nausea, vomiting, or muscle weakness.  ASSESSMENT AND PLAN:  Vitamin D deficiency  Class 1 obesity with serious comorbidity and body mass index (BMI) of 34.0 to 34.9 in adult, unspecified obesity type  PLAN:  Vitamin D Deficiency [name] was informed that low vitamin D levels contribute to fatigue and are associated with obesity, breast, and colon cancer. [name] agrees to continue to take prescription Vit D @50 ,000 IU every week as prescribed and will follow up for routine testing of vitamin D, at least 2-3 times per year. She was informed of the risk of over-replacement of vitamin D and agrees to not increase her dose unless she discusses this with [place] first. We will recheck labs in 1 month and Mackinzie agrees to follow  up in 2 weeks as directed.  I spent > than 50% of the 15 minute visit on counseling as documented in the note.  Obesity [name] is currently in the action stage of change. As such, her goal is to continue with weight loss efforts. She has agreed to follow the Category 3 plan. [name] has been instructed to work up to a goal of 150 minutes of combined cardio and strengthening exercise per week for weight loss and overall health benefits. We discussed the following Behavioral Modification Strategies today: increasing lean protein intake, work on meal planning and easy cooking plans, emotional eating strategies, and ways to avoid boredom eating.  [name] has agreed to follow up with our clinic in 2 weeks. She was informed of the importance of frequent follow up visits to maximize her success with intensive lifestyle modifications for her multiple health conditions.  ALLERGIES: No Known Allergies  MEDICATIONS: Current Outpatient Medications on File Prior to Visit  Medication Sig Dispense Refill  . Vitamin D, Ergocalciferol, (DRISDOL) 1.25 MG (50000 UT) CAPS capsule Take 1 capsule (50,000 Units total) by mouth every 7 (seven) days. 4 capsule 0   No current facility-administered medications on file prior to visit.     PAST MEDICAL HISTORY: Past Medical History:  Diagnosis Date  . Infertility, female   . Thyroid disease    thyroid nodules    PAST SURGICAL HISTORY: Past Surgical History:  Procedure Laterality Date  . BREAST BIOPSY  08/2017   left  breast  . ivf     . KNEE ARTHROSCOPY    . polpys      SOCIAL HISTORY: Social History   Tobacco Use  . Smoking status: Never Smoker  . Smokeless tobacco: Never Used  Substance Use Topics  . Alcohol use: No    Alcohol/week: 0.0 standard drinks  . Drug use: No    FAMILY HISTORY: Family History  Problem Relation Age of Onset  . Cancer Mother   . Cancer Maternal Aunt   . Diabetes Father   . Hypertension Father     ROS: Review of  Systems  Gastrointestinal: Negative for nausea and vomiting.  Musculoskeletal:       Negative for muscle weakness.    PHYSICAL EXAM: Pt in no acute distress  RECENT LABS AND TESTS: BMET    Component Value Date/Time   NA 142 05/20/2018 1403   K 4.4 05/20/2018 1403   CL 104 05/20/2018 1403   CO2 22 05/20/2018 1403   GLUCOSE 87 05/20/2018 1403   GLUCOSE 93 06/29/2014 1011   BUN 12 05/20/2018 1403   CREATININE 0.70 05/20/2018 1403   CREATININE 0.60 06/29/2014 1011   CALCIUM 9.4 05/20/2018 1403   GFRNONAA 104 05/20/2018 1403   GFRAA 120 05/20/2018 1403   Lab Results  Component Value Date   HGBA1C 5.7 (H) 05/20/2018   HGBA1C 5.4 09/25/2016   HGBA1C 5.8 (H) 06/29/2014   Lab Results  Component Value Date   INSULIN 5.8 05/20/2018   CBC    Component Value Date/Time   WBC 7.6 05/20/2018 1403   WBC 8.6 06/29/2014 1011   RBC 4.18 05/20/2018 1403   RBC 4.25 06/29/2014 1011   HGB 13.5 05/20/2018 1403   HCT 39.3 05/20/2018 1403   PLT 252 09/25/2016 1336   MCV 94 05/20/2018 1403   MCH 32.3 05/20/2018 1403   MCH 31.3 06/29/2014 1011   MCHC 34.4 05/20/2018 1403   MCHC 33.9 06/29/2014 1011   RDW 12.5 05/20/2018 1403   LYMPHSABS 2.9 05/20/2018 1403   MONOABS 0.7 06/29/2014 1011   EOSABS 0.1 05/20/2018 1403   BASOSABS 0.0 05/20/2018 1403   Iron/TIBC/Ferritin/ %Sat    Component Value Date/Time   IRON 149 09/25/2016 1336   TIBC 277 09/25/2016 1336   FERRITIN 32 09/25/2016 1336   IRONPCTSAT 54 09/25/2016 1336   Lipid Panel     Component Value Date/Time   CHOL 170 05/20/2018 1403   TRIG 63 05/20/2018 1403   HDL 50 05/20/2018 1403   CHOLHDL 3.2 06/29/2014 1011   VLDL 13 06/29/2014 1011   LDLCALC 107 (H) 05/20/2018 1403   Hepatic Function Panel     Component Value Date/Time   PROT 7.3 05/20/2018 1403   ALBUMIN 4.2 05/20/2018 1403   AST 13 05/20/2018 1403   ALT 11 05/20/2018 1403   ALKPHOS 91 05/20/2018 1403   BILITOT 0.4 05/20/2018 1403      Component Value  Date/Time   TSH 0.556 05/20/2018 1403   TSH 0.716 09/25/2016 1336   TSH 0.48 01/24/2016 1122   Results for ABBS, Ronnica SUZANNE "Cassie [name]" (MRN [id number]) as of 06/17/2018 10:58  Ref. Range 05/20/2018 14:03  Vitamin D, 25-Hydroxy Latest Ref Range: 30.0 - 100.0 ng/mL 25.1 (L)    I, Marcille Blanco, CMA, am acting as transcriptionist for Starlyn Skeans, MD I have reviewed the above documentation for accuracy and completeness, and I agree with the above. -Dennard Nip, MD

## 2018-07-01 ENCOUNTER — Ambulatory Visit (INDEPENDENT_AMBULATORY_CARE_PROVIDER_SITE_OTHER): Payer: 59 | Admitting: Family Medicine

## 2018-07-01 ENCOUNTER — Other Ambulatory Visit: Payer: Self-pay

## 2018-07-01 ENCOUNTER — Encounter (INDEPENDENT_AMBULATORY_CARE_PROVIDER_SITE_OTHER): Payer: Self-pay | Admitting: Family Medicine

## 2018-07-01 DIAGNOSIS — F411 Generalized anxiety disorder: Secondary | ICD-10-CM

## 2018-07-01 DIAGNOSIS — E669 Obesity, unspecified: Secondary | ICD-10-CM

## 2018-07-01 DIAGNOSIS — E559 Vitamin D deficiency, unspecified: Secondary | ICD-10-CM | POA: Diagnosis not present

## 2018-07-01 DIAGNOSIS — Z6834 Body mass index (BMI) 34.0-34.9, adult: Secondary | ICD-10-CM

## 2018-07-01 MED ORDER — VITAMIN D (ERGOCALCIFEROL) 1.25 MG (50000 UNIT) PO CAPS: 50000.0000 [IU] | ORAL_CAPSULE | ORAL | 0 refills | Status: DC

## 2018-07-01 NOTE — Progress Notes (Signed)
Office: [phone]  /  Fax: [phone] TeleHealth Visit:  Cassie [name] has verbally consented to this TeleHealth visit today. The patient is located at home, the provider is located at the [facility] office. The participants in this visit include the listed provider and patient. Cassie [name] was unable to use Webex today and the Telehealth visit was conducted via telephone.  HPI:   Chief Complaint: OBESITY Cassie [name] is here to discuss her progress with her obesity treatment plan. She is on the Category 3 plan and is following her eating plan approximately 90 to 95 % of the time. She states she is using the elliptical 40 minutes 3 times per week. Cassie [name] is doing well with her diet prescription and thinks that she has lost another 2 pounds since our last visit. She is struggling with getting all of her groceries, but she is substituting well.  We were unable to weigh the patient today for this TeleHealth visit. She feels as if she has lost weight since her last visit. She has lost 4 lbs since starting treatment with Cassie [name].  Vitamin D Deficiency Cassie [name] has a diagnosis of vitamin D deficiency. She is currently stable on vit D, but is not yet at goal.  Generalized Anxiety Disorder Cassie [name] notes that her anxiety is at 6 of 10 while staying home from work. She feels that she is dealing with things well overall and has made some strategies to help her get through this time.  ASSESSMENT AND PLAN:  Vitamin D deficiency - Plan: Vitamin D, Ergocalciferol, (DRISDOL) 1.25 MG (50000 UT) CAPS capsule  GAD (generalized anxiety disorder)  Class 1 obesity with serious comorbidity and body mass index (BMI) of 34.0 to 34.9 in adult, unspecified obesity type  PLAN:  Vitamin D Deficiency [name] was informed that low vitamin D levels contribute to fatigue and are associated with obesity, breast, and colon cancer. Cassie [name] agrees to continue to take prescription Vit D @50 ,000 IU every week #12 with no refills  and will follow up for routine testing of vitamin D, at least 2-3 times per year. She was informed of the risk of over-replacement of vitamin D and agrees to not increase her dose unless she discusses this with Cassie [name] first. Cassie [name] agrees to follow up in 2 to 3 weeks as directed.  Generalized Anxiety Disorder Cassie [name] declined medication and was offered comfort and support. She agrees to follow up at the agreed upon time.  Obesity Cassie [name] is currently in the action stage of change. As such, her goal is to continue with weight loss efforts. She has agreed to follow the Category 3 plan. Cassie [name] has been instructed to work up to a goal of 150 minutes of combined cardio and strengthening exercise per week for weight loss and overall health benefits. We discussed the following Behavioral Modification Strategies today: work on meal planning and easy cooking plans and keeping healthy foods in the home.  Cassie [name] has agreed to follow up with our clinic in 2 to 3 weeks. She was informed of the importance of frequent follow up visits to maximize her success with intensive lifestyle modifications for her multiple health conditions.  ALLERGIES: No Known Allergies  MEDICATIONS: No current outpatient medications on file prior to visit.   No current facility-administered medications on file prior to visit.     PAST MEDICAL HISTORY: Past Medical History:  Diagnosis Date  . Infertility, female   . Thyroid disease    thyroid nodules    PAST SURGICAL  HISTORY: Past Surgical History:  Procedure Laterality Date  . BREAST BIOPSY  08/2017   left breast  . ivf     . KNEE ARTHROSCOPY    . polpys      SOCIAL HISTORY: Social History   Tobacco Use  . Smoking status: Never Smoker  . Smokeless tobacco: Never Used  Substance Use Topics  . Alcohol use: No    Alcohol/week: 0.0 standard drinks  . Drug use: No    FAMILY HISTORY: Family History  Problem Relation Age of Onset  . Cancer Mother   . Cancer Maternal Aunt    . Diabetes Father   . Hypertension Father     ROS: Review of Systems  Psychiatric/Behavioral: The patient is nervous/anxious.     PHYSICAL EXAM: Pt in no acute distress  RECENT LABS AND TESTS: BMET    Component Value Date/Time   NA 142 05/20/2018 1403   K 4.4 05/20/2018 1403   CL 104 05/20/2018 1403   CO2 22 05/20/2018 1403   GLUCOSE 87 05/20/2018 1403   GLUCOSE 93 06/29/2014 1011   BUN 12 05/20/2018 1403   CREATININE 0.70 05/20/2018 1403   CREATININE 0.60 06/29/2014 1011   CALCIUM 9.4 05/20/2018 1403   GFRNONAA 104 05/20/2018 1403   GFRAA 120 05/20/2018 1403   Lab Results  Component Value Date   HGBA1C 5.7 (H) 05/20/2018   HGBA1C 5.4 09/25/2016   HGBA1C 5.8 (H) 06/29/2014   Lab Results  Component Value Date   INSULIN 5.8 05/20/2018   CBC    Component Value Date/Time   WBC 7.6 05/20/2018 1403   WBC 8.6 06/29/2014 1011   RBC 4.18 05/20/2018 1403   RBC 4.25 06/29/2014 1011   HGB 13.5 05/20/2018 1403   HCT 39.3 05/20/2018 1403   PLT 252 09/25/2016 1336   MCV 94 05/20/2018 1403   MCH 32.3 05/20/2018 1403   MCH 31.3 06/29/2014 1011   MCHC 34.4 05/20/2018 1403   MCHC 33.9 06/29/2014 1011   RDW 12.5 05/20/2018 1403   LYMPHSABS 2.9 05/20/2018 1403   MONOABS 0.7 06/29/2014 1011   EOSABS 0.1 05/20/2018 1403   BASOSABS 0.0 05/20/2018 1403   Iron/TIBC/Ferritin/ %Sat    Component Value Date/Time   IRON 149 09/25/2016 1336   TIBC 277 09/25/2016 1336   FERRITIN 32 09/25/2016 1336   IRONPCTSAT 54 09/25/2016 1336   Lipid Panel     Component Value Date/Time   CHOL 170 05/20/2018 1403   TRIG 63 05/20/2018 1403   HDL 50 05/20/2018 1403   CHOLHDL 3.2 06/29/2014 1011   VLDL 13 06/29/2014 1011   LDLCALC 107 (H) 05/20/2018 1403   Hepatic Function Panel     Component Value Date/Time   PROT 7.3 05/20/2018 1403   ALBUMIN 4.2 05/20/2018 1403   AST 13 05/20/2018 1403   ALT 11 05/20/2018 1403   ALKPHOS 91 05/20/2018 1403   BILITOT 0.4 05/20/2018 1403       Component Value Date/Time   TSH 0.556 05/20/2018 1403   TSH 0.716 09/25/2016 1336   TSH 0.48 01/24/2016 1122   Results for LORENZ, Ardeth SUZANNE "Vinnie Level" (MRN [id number]) as of 07/01/2018 12:03  Ref. Range 05/20/2018 14:03  Vitamin D, 25-Hydroxy Latest Ref Range: 30.0 - 100.0 ng/mL 25.1 (L)     I, Cassie [name] Blanco, CMA, am acting as transcriptionist for Starlyn Skeans, MD I have reviewed the above documentation for accuracy and completeness, and I agree with the above. -Dennard Nip, MD

## 2018-07-07 DIAGNOSIS — M79641 Pain in right hand: Secondary | ICD-10-CM | POA: Diagnosis not present

## 2018-07-22 ENCOUNTER — Encounter (INDEPENDENT_AMBULATORY_CARE_PROVIDER_SITE_OTHER): Payer: Self-pay | Admitting: Family Medicine

## 2018-07-22 ENCOUNTER — Other Ambulatory Visit: Payer: Self-pay

## 2018-07-22 ENCOUNTER — Ambulatory Visit (INDEPENDENT_AMBULATORY_CARE_PROVIDER_SITE_OTHER): Payer: 59 | Admitting: Family Medicine

## 2018-07-22 DIAGNOSIS — Z6834 Body mass index (BMI) 34.0-34.9, adult: Secondary | ICD-10-CM

## 2018-07-22 DIAGNOSIS — E559 Vitamin D deficiency, unspecified: Secondary | ICD-10-CM | POA: Diagnosis not present

## 2018-07-22 DIAGNOSIS — E669 Obesity, unspecified: Secondary | ICD-10-CM | POA: Diagnosis not present

## 2018-07-22 MED ORDER — VITAMIN D (ERGOCALCIFEROL) 1.25 MG (50000 UNIT) PO CAPS: 50000.0000 [IU] | ORAL_CAPSULE | ORAL | 0 refills | Status: DC

## 2018-07-22 NOTE — Progress Notes (Signed)
Office: [phone]  /  Fax: [phone] TeleHealth Visit:  Cassie [name] has verbally consented to this TeleHealth visit today. The patient is located at home, the provider is located at the [facility] office. The participants in this visit include the listed provider and patient. The visit was conducted today via Webex.  HPI:   Chief Complaint: OBESITY Cassie [name] is here to discuss her progress with her obesity treatment plan. She is on the Category 3 plan and is following her eating plan approximately 95 % of the time. She states she is walking and riding her bike 45 minutes 3 to 4 times per week. Cassie [name] has been doing well with maintaining her weight and following her Category 3 plan. She thinks that she may have lost 1 to 2 pounds since our last visit. She states that her hunger is controlled and she is tolerating her Category 3 plan. Cassie [name] is keeping herself occupied and had minimized boredom eating.  We were unable to weigh the patient today for this TeleHealth visit. She feels as if she has lost weight since her last visit. She has lost 4 lbs since starting treatment with [place].  Vitamin D Deficiency Cassie [name] has a diagnosis of vitamin D deficiency. She is currently on vit D, but is not yet at goal. Cassie [name] denies nausea, vomiting, or muscle weakness.  ASSESSMENT AND PLAN:  Vitamin D deficiency - Plan: Vitamin D, Ergocalciferol, (DRISDOL) 1.25 MG (50000 UT) CAPS capsule  Class 1 obesity with serious comorbidity and body mass index (BMI) of 34.0 to 34.9 in adult, unspecified obesity type  PLAN:  Vitamin D Deficiency Cassie [name] was informed that low vitamin D levels contribute to fatigue and are associated with obesity, breast, and colon cancer. Cassie [name] agrees to continue to take prescription Vit D @50 ,000 IU every week #12 with no refills and will follow up for routine testing of vitamin D, at least 2-3 times per year. She was informed of the risk of over-replacement of vitamin D and  agrees to not increase her dose unless she discusses this with [place] first. Cassie [name] agrees to follow up in 2 weeks as directed.  Obesity Cassie [name] is currently in the action stage of change. As such, her goal is to continue with weight loss efforts. She has agreed to follow the Category 3 plan. Cassie [name] has been instructed to work up to a goal of 150 minutes of combined cardio and strengthening exercise per week for weight loss and overall health benefits. We discussed the following Behavioral Modification Strategies today: work on meal planning and easy cooking plans, keeping healthy food in the home, and emotional eating strategies.  Cassie [name] has agreed to follow up with our clinic in 2 weeks. She was informed of the importance of frequent follow up visits to maximize her success with intensive lifestyle modifications for her multiple health conditions.  ALLERGIES: No Known Allergies  MEDICATIONS: Current Outpatient Medications on File Prior to Visit  Medication Sig Dispense Refill  . Vitamin D, Ergocalciferol, (DRISDOL) 1.25 MG (50000 UT) CAPS capsule Take 1 capsule (50,000 Units total) by mouth every 7 (seven) days. 12 capsule 0   No current facility-administered medications on file prior to visit.     PAST MEDICAL HISTORY: Past Medical History:  Diagnosis Date  . Infertility, female   . Thyroid disease    thyroid nodules    PAST SURGICAL HISTORY: Past Surgical History:  Procedure Laterality Date  . BREAST BIOPSY  08/2017   left breast  .  ivf     . KNEE ARTHROSCOPY    . polpys      SOCIAL HISTORY: Social History   Tobacco Use  . Smoking status: Never Smoker  . Smokeless tobacco: Never Used  Substance Use Topics  . Alcohol use: No    Alcohol/week: 0.0 standard drinks  . Drug use: No    FAMILY HISTORY: Family History  Problem Relation Age of Onset  . Cancer Mother   . Cancer Maternal Aunt   . Diabetes Father   . Hypertension Father     ROS: Review of Systems   Gastrointestinal: Negative for nausea and vomiting.  Musculoskeletal:       Negative for muscle weakness.    PHYSICAL EXAM: Pt in no acute distress  RECENT LABS AND TESTS: BMET    Component Value Date/Time   NA 142 05/20/2018 1403   K 4.4 05/20/2018 1403   CL 104 05/20/2018 1403   CO2 22 05/20/2018 1403   GLUCOSE 87 05/20/2018 1403   GLUCOSE 93 06/29/2014 1011   BUN 12 05/20/2018 1403   CREATININE 0.70 05/20/2018 1403   CREATININE 0.60 06/29/2014 1011   CALCIUM 9.4 05/20/2018 1403   GFRNONAA 104 05/20/2018 1403   GFRAA 120 05/20/2018 1403   Lab Results  Component Value Date   HGBA1C 5.7 (H) 05/20/2018   HGBA1C 5.4 09/25/2016   HGBA1C 5.8 (H) 06/29/2014   Lab Results  Component Value Date   INSULIN 5.8 05/20/2018   CBC    Component Value Date/Time   WBC 7.6 05/20/2018 1403   WBC 8.6 06/29/2014 1011   RBC 4.18 05/20/2018 1403   RBC 4.25 06/29/2014 1011   HGB 13.5 05/20/2018 1403   HCT 39.3 05/20/2018 1403   PLT 252 09/25/2016 1336   MCV 94 05/20/2018 1403   MCH 32.3 05/20/2018 1403   MCH 31.3 06/29/2014 1011   MCHC 34.4 05/20/2018 1403   MCHC 33.9 06/29/2014 1011   RDW 12.5 05/20/2018 1403   LYMPHSABS 2.9 05/20/2018 1403   MONOABS 0.7 06/29/2014 1011   EOSABS 0.1 05/20/2018 1403   BASOSABS 0.0 05/20/2018 1403   Iron/TIBC/Ferritin/ %Sat    Component Value Date/Time   IRON 149 09/25/2016 1336   TIBC 277 09/25/2016 1336   FERRITIN 32 09/25/2016 1336   IRONPCTSAT 54 09/25/2016 1336   Lipid Panel     Component Value Date/Time   CHOL 170 05/20/2018 1403   TRIG 63 05/20/2018 1403   HDL 50 05/20/2018 1403   CHOLHDL 3.2 06/29/2014 1011   VLDL 13 06/29/2014 1011   LDLCALC 107 (H) 05/20/2018 1403   Hepatic Function Panel     Component Value Date/Time   PROT 7.3 05/20/2018 1403   ALBUMIN 4.2 05/20/2018 1403   AST 13 05/20/2018 1403   ALT 11 05/20/2018 1403   ALKPHOS 91 05/20/2018 1403   BILITOT 0.4 05/20/2018 1403      Component Value Date/Time    TSH 0.556 05/20/2018 1403   TSH 0.716 09/25/2016 1336   TSH 0.48 01/24/2016 1122   Results for FARACI, Cassie [name] "Cassie [name]" (MRN [id number]) as of 07/22/2018 15:12  Ref. Range 05/20/2018 14:03  Vitamin D, 25-Hydroxy Latest Ref Range: 30.0 - 100.0 ng/mL 25.1 (L)     I, Marcille Blanco, CMA, am acting as transcriptionist for Starlyn Skeans, MD I have reviewed the above documentation for accuracy and completeness, and I agree with the above. -Dennard Nip, MD

## 2018-07-28 DIAGNOSIS — M79641 Pain in right hand: Secondary | ICD-10-CM | POA: Diagnosis not present

## 2018-08-05 ENCOUNTER — Ambulatory Visit (INDEPENDENT_AMBULATORY_CARE_PROVIDER_SITE_OTHER): Payer: 59 | Admitting: Family Medicine

## 2018-08-05 ENCOUNTER — Other Ambulatory Visit: Payer: Self-pay

## 2018-08-05 ENCOUNTER — Encounter (INDEPENDENT_AMBULATORY_CARE_PROVIDER_SITE_OTHER): Payer: Self-pay | Admitting: Family Medicine

## 2018-08-05 DIAGNOSIS — E669 Obesity, unspecified: Secondary | ICD-10-CM

## 2018-08-05 DIAGNOSIS — F419 Anxiety disorder, unspecified: Secondary | ICD-10-CM

## 2018-08-05 DIAGNOSIS — Z6834 Body mass index (BMI) 34.0-34.9, adult: Secondary | ICD-10-CM

## 2018-08-05 NOTE — Progress Notes (Signed)
Office: [phone]  /  Fax: [phone] TeleHealth Visit:  Rosalene Wardrop has verbally consented to this TeleHealth visit today. The patient is located at home, the provider is located at the [facility] office. The participants in this visit include the listed provider and patient . The visit was conducted today via Webex.  HPI:  Chief Complaint: OBESITY Fredrica is here to discuss her progress with her obesity treatment plan. She is on the  follow the Category 3 plan and is following her eating plan approximately 95 % of the time. She states she is exercising by walking for 60 minutes 5-7 times per week. Naleyah continues to do well with prescribed diet and has done well maintaining her weigt over the last 2 weeks. Her hunger is controlled but she notes increased stress due to COVID-19 related business issues.    We were unable to weigh the patient today for this TeleHealth visit. She feels as if she has maintaied weight since her last visit. She has lost 4 lbs since starting treatment with [place].  Anxiety Daleah has a diagnosis of anxiety. She feels she is doing well with mood and is doing well avoiding too much emotional eating. She has started back to work and feels this is helping her mood.   ASSESSMENT AND PLAN:  Anxiety  Class 1 obesity with serious comorbidity and body mass index (BMI) of 34.0 to 34.9 in adult, unspecified obesity type  PLAN: Anxiety Lilias agrees to continue being mindful about emotional eating and will continue to be monitored. Agrees to follow up with our clinic as directed.   Obesity Sharlet is currently in the action stage of change. As such, her goal is to continue with weight loss efforts She has agreed to follow the Category 3 plan Sanvika has been instructed to work up to a goal of 150 minutes of combined cardio and strengthening exercise per week for weight loss and overall health benefits. We discussed the following Behavioral Modification  Stratagies today: increasing lean protein intake, decreasing simple carbohydrates  and work on meal planning and easy cooking plans   Kadisha has agreed to follow up with our clinic in 2-3 weeks. She was informed of the importance of frequent follow up visits to maximize her success with intensive lifestyle modifications for her multiple health conditions.  ALLERGIES: No Known Allergies  MEDICATIONS: Current Outpatient Medications on File Prior to Visit  Medication Sig Dispense Refill  . Vitamin D, Ergocalciferol, (DRISDOL) 1.25 MG (50000 UT) CAPS capsule Take 1 capsule (50,000 Units total) by mouth every 7 (seven) days. 12 capsule 0   No current facility-administered medications on file prior to visit.     PAST MEDICAL HISTORY: Past Medical History:  Diagnosis Date  . Infertility, female   . Thyroid disease    thyroid nodules    PAST SURGICAL HISTORY: Past Surgical History:  Procedure Laterality Date  . BREAST BIOPSY  08/2017   left breast  . ivf     . KNEE ARTHROSCOPY    . polpys      SOCIAL HISTORY: Social History   Tobacco Use  . Smoking status: Never Smoker  . Smokeless tobacco: Never Used  Substance Use Topics  . Alcohol use: No    Alcohol/week: 0.0 standard drinks  . Drug use: No    FAMILY HISTORY: Family History  Problem Relation Age of Onset  . Cancer Mother   . Cancer Maternal Aunt   . Diabetes Father   .  Hypertension Father     ROS: Review of Systems  Psychiatric/Behavioral: The patient is nervous/anxious.     PHYSICAL EXAM: Pt in no acute distress  RECENT LABS AND TESTS: BMET    Component Value Date/Time   NA 142 05/20/2018 1403   K 4.4 05/20/2018 1403   CL 104 05/20/2018 1403   CO2 22 05/20/2018 1403   GLUCOSE 87 05/20/2018 1403   GLUCOSE 93 06/29/2014 1011   BUN 12 05/20/2018 1403   CREATININE 0.70 05/20/2018 1403   CREATININE 0.60 06/29/2014 1011   CALCIUM 9.4 05/20/2018 1403   GFRNONAA 104 05/20/2018 1403   GFRAA 120  05/20/2018 1403   Lab Results  Component Value Date   HGBA1C 5.7 (H) 05/20/2018   HGBA1C 5.4 09/25/2016   HGBA1C 5.8 (H) 06/29/2014   Lab Results  Component Value Date   INSULIN 5.8 05/20/2018   CBC    Component Value Date/Time   WBC 7.6 05/20/2018 1403   WBC 8.6 06/29/2014 1011   RBC 4.18 05/20/2018 1403   RBC 4.25 06/29/2014 1011   HGB 13.5 05/20/2018 1403   HCT 39.3 05/20/2018 1403   PLT 252 09/25/2016 1336   MCV 94 05/20/2018 1403   MCH 32.3 05/20/2018 1403   MCH 31.3 06/29/2014 1011   MCHC 34.4 05/20/2018 1403   MCHC 33.9 06/29/2014 1011   RDW 12.5 05/20/2018 1403   LYMPHSABS 2.9 05/20/2018 1403   MONOABS 0.7 06/29/2014 1011   EOSABS 0.1 05/20/2018 1403   BASOSABS 0.0 05/20/2018 1403   Iron/TIBC/Ferritin/ %Sat    Component Value Date/Time   IRON 149 09/25/2016 1336   TIBC 277 09/25/2016 1336   FERRITIN 32 09/25/2016 1336   IRONPCTSAT 54 09/25/2016 1336   Lipid Panel     Component Value Date/Time   CHOL 170 05/20/2018 1403   TRIG 63 05/20/2018 1403   HDL 50 05/20/2018 1403   CHOLHDL 3.2 06/29/2014 1011   VLDL 13 06/29/2014 1011   LDLCALC 107 (H) 05/20/2018 1403   Hepatic Function Panel     Component Value Date/Time   PROT 7.3 05/20/2018 1403   ALBUMIN 4.2 05/20/2018 1403   AST 13 05/20/2018 1403   ALT 11 05/20/2018 1403   ALKPHOS 91 05/20/2018 1403   BILITOT 0.4 05/20/2018 1403      Component Value Date/Time   TSH 0.556 05/20/2018 1403   TSH 0.716 09/25/2016 1336   TSH 0.48 01/24/2016 1122      I, Renee Ramus, am acting as Location manager for Dennard Nip, MD  I have reviewed the above documentation for accuracy and completeness, and I agree with the above. -Dennard Nip, MD

## 2018-08-19 ENCOUNTER — Other Ambulatory Visit: Payer: Self-pay

## 2018-08-19 ENCOUNTER — Encounter (INDEPENDENT_AMBULATORY_CARE_PROVIDER_SITE_OTHER): Payer: Self-pay | Admitting: Family Medicine

## 2018-08-19 ENCOUNTER — Ambulatory Visit (INDEPENDENT_AMBULATORY_CARE_PROVIDER_SITE_OTHER): Payer: 59 | Admitting: Family Medicine

## 2018-08-19 DIAGNOSIS — Z6834 Body mass index (BMI) 34.0-34.9, adult: Secondary | ICD-10-CM

## 2018-08-19 DIAGNOSIS — E669 Obesity, unspecified: Secondary | ICD-10-CM

## 2018-08-19 DIAGNOSIS — R7303 Prediabetes: Secondary | ICD-10-CM

## 2018-08-25 NOTE — Progress Notes (Signed)
Office: [phone]  /  Fax: [phone] TeleHealth Visit:  Cassie [name] has verbally consented to this TeleHealth visit today. The patient is located at home, the provider is located at the [facility] office. The participants in this visit include the listed provider and patient. The visit was conducted today via webex.  HPI:   Chief Complaint: OBESITY Cassie [name] is here to discuss her progress with her obesity treatment plan. She is on the Category 3 plan and is following her eating plan approximately 80 % of the time. She states she is walking and on the elliptical for 30-45 minutes 4 times per week. Yissel has been doing well maintaining her weight, and she feels she has lost only 1 lbs in the last 2 weeks. She is now mostly back to work, and doing better with boredom eating and is more active as well.  We were unable to weigh the patient today for this TeleHealth visit. She feels as if she has lost 1 lb since her last visit. She has lost 4-5 lbs since starting treatment with [place].  Pre-Diabetes Cassie [name] has a diagnosis of pre-diabetes based on her elevated Hgb A1c at 5.7 and was informed this puts her at greater risk of developing diabetes. She is not taking metformin currently and she is doing well with diet and exercise with decreased polyphagia. She denies nausea or hypoglycemia.  ASSESSMENT AND PLAN:  Prediabetes  Class 1 obesity with serious comorbidity and body mass index (BMI) of 34.0 to 34.9 in adult, unspecified obesity type  PLAN:  Pre-Diabetes Cassie [name] will continue to work on weight loss, diet, exercise, and decreasing simple carbohydrates in her diet to help decrease the risk of diabetes. We dicussed metformin including benefits and risks. She was informed that eating too many simple carbohydrates or too many calories at one sitting increases the likelihood of GI side effects. Cassie [name] declined metformin for now and a prescription was not written today. We will recheck  labs at her next office visit. Cassie [name] agrees to follow up with our clinic in 2 weeks as directed to monitor her progress.  I spent > than 50% of the 15 minute visit on counseling as documented in the note.  Obesity Cassie [name] is currently in the action stage of change. As such, her goal is to continue with weight loss efforts She has agreed to follow the Category 3 plan Cassie [name] has been instructed to work up to a goal of 150 minutes of combined cardio and strengthening exercise per week for weight loss and overall health benefits. We discussed the following Behavioral Modification Strategies today: increasing lean protein intake and decreasing simple carbohydrates    Cassie [name] has agreed to follow up with our clinic in 2 weeks. She was informed of the importance of frequent follow up visits to maximize her success with intensive lifestyle modifications for her multiple health conditions.  ALLERGIES: No Known Allergies  MEDICATIONS: Current Outpatient Medications on File Prior to Visit  Medication Sig Dispense Refill  . Vitamin D, Ergocalciferol, (DRISDOL) 1.25 MG (50000 UT) CAPS capsule Take 1 capsule (50,000 Units total) by mouth every 7 (seven) days. 12 capsule 0   No current facility-administered medications on file prior to visit.     PAST MEDICAL HISTORY: Past Medical History:  Diagnosis Date  . Infertility, female   . Thyroid disease    thyroid nodules    PAST SURGICAL HISTORY: Past Surgical History:  Procedure Laterality Date  . BREAST BIOPSY  08/2017  left breast  . ivf     . KNEE ARTHROSCOPY    . polpys      SOCIAL HISTORY: Social History   Tobacco Use  . Smoking status: Never Smoker  . Smokeless tobacco: Never Used  Substance Use Topics  . Alcohol use: No    Alcohol/week: 0.0 standard drinks  . Drug use: No    FAMILY HISTORY: Family History  Problem Relation Age of Onset  . Cancer Mother   . Cancer Maternal Aunt   . Diabetes Father   . Hypertension Father      ROS: Review of Systems  Constitutional: Positive for weight loss.  Gastrointestinal: Negative for nausea.  Endo/Heme/Allergies:       Positive polyphagia Negative hypoglycemia    PHYSICAL EXAM: Pt in no acute distress  RECENT LABS AND TESTS: BMET    Component Value Date/Time   NA 142 05/20/2018 1403   K 4.4 05/20/2018 1403   CL 104 05/20/2018 1403   CO2 22 05/20/2018 1403   GLUCOSE 87 05/20/2018 1403   GLUCOSE 93 06/29/2014 1011   BUN 12 05/20/2018 1403   CREATININE 0.70 05/20/2018 1403   CREATININE 0.60 06/29/2014 1011   CALCIUM 9.4 05/20/2018 1403   GFRNONAA 104 05/20/2018 1403   GFRAA 120 05/20/2018 1403   Lab Results  Component Value Date   HGBA1C 5.7 (H) 05/20/2018   HGBA1C 5.4 09/25/2016   HGBA1C 5.8 (H) 06/29/2014   Lab Results  Component Value Date   INSULIN 5.8 05/20/2018   CBC    Component Value Date/Time   WBC 7.6 05/20/2018 1403   WBC 8.6 06/29/2014 1011   RBC 4.18 05/20/2018 1403   RBC 4.25 06/29/2014 1011   HGB 13.5 05/20/2018 1403   HCT 39.3 05/20/2018 1403   PLT 252 09/25/2016 1336   MCV 94 05/20/2018 1403   MCH 32.3 05/20/2018 1403   MCH 31.3 06/29/2014 1011   MCHC 34.4 05/20/2018 1403   MCHC 33.9 06/29/2014 1011   RDW 12.5 05/20/2018 1403   LYMPHSABS 2.9 05/20/2018 1403   MONOABS 0.7 06/29/2014 1011   EOSABS 0.1 05/20/2018 1403   BASOSABS 0.0 05/20/2018 1403   Iron/TIBC/Ferritin/ %Sat    Component Value Date/Time   IRON 149 09/25/2016 1336   TIBC 277 09/25/2016 1336   FERRITIN 32 09/25/2016 1336   IRONPCTSAT 54 09/25/2016 1336   Lipid Panel     Component Value Date/Time   CHOL 170 05/20/2018 1403   TRIG 63 05/20/2018 1403   HDL 50 05/20/2018 1403   CHOLHDL 3.2 06/29/2014 1011   VLDL 13 06/29/2014 1011   LDLCALC 107 (H) 05/20/2018 1403   Hepatic Function Panel     Component Value Date/Time   PROT 7.3 05/20/2018 1403   ALBUMIN 4.2 05/20/2018 1403   AST 13 05/20/2018 1403   ALT 11 05/20/2018 1403   ALKPHOS 91  05/20/2018 1403   BILITOT 0.4 05/20/2018 1403      Component Value Date/Time   TSH 0.556 05/20/2018 1403   TSH 0.716 09/25/2016 1336   TSH 0.48 01/24/2016 1122      I, Trixie Dredge, am acting as Location manager for Dennard Nip, MD I have reviewed the above documentation for accuracy and completeness, and I agree with the above. -Dennard Nip, MD

## 2018-09-02 ENCOUNTER — Ambulatory Visit (INDEPENDENT_AMBULATORY_CARE_PROVIDER_SITE_OTHER): Payer: 59 | Admitting: Family Medicine

## 2018-09-02 ENCOUNTER — Encounter (INDEPENDENT_AMBULATORY_CARE_PROVIDER_SITE_OTHER): Payer: Self-pay | Admitting: Family Medicine

## 2018-09-02 ENCOUNTER — Other Ambulatory Visit: Payer: Self-pay

## 2018-09-02 DIAGNOSIS — R7303 Prediabetes: Secondary | ICD-10-CM | POA: Diagnosis not present

## 2018-09-02 DIAGNOSIS — Z6834 Body mass index (BMI) 34.0-34.9, adult: Secondary | ICD-10-CM

## 2018-09-02 DIAGNOSIS — E669 Obesity, unspecified: Secondary | ICD-10-CM | POA: Diagnosis not present

## 2018-09-07 NOTE — Progress Notes (Signed)
Office: [phone]  /  Fax: [phone] TeleHealth Visit:  Cassie [name] has verbally consented to this TeleHealth visit today. The patient is located at home, the provider is located at the [facility] office. The participants in this visit include the listed provider and patient. The visit was conducted today via Webex.  HPI:   Chief Complaint: OBESITY Cassie [name] is here to discuss her progress with her obesity treatment plan. She is on the Category 3 plan and is following her eating plan approximately 90 to 95 % of the time. She states she is walking and biking 30 to 45 minutes 4 times per week. Finesse continues to do well with weight and has lost another 2 pounds since our last visit. She is following her Category 3 plan well and notes her hunger is controlled. She is walking or biking with her family at least 3 to 4 weeks in the evenings and is feeling well overall.  We were unable to weigh the patient today for this TeleHealth visit. She feels as if she has lost weight since her last visit. She has lost 4 lbs since starting treatment with [place].  Pre-Diabetes Cassie [name] has a diagnosis of pre-diabetes based on her elevated Hgb A1c of 5.7 on 05/20/18 and was informed this puts her at greater risk of developing diabetes. She continues to do well with diet, exercise, and weight loss. She is not taking metformin currently and continues to work on diet and exercise to decrease risk of diabetes. She admits reduced polyphagia and denies hypoglycemia.  ASSESSMENT AND PLAN:  Prediabetes  Class 1 obesity with serious comorbidity and body mass index (BMI) of 34.0 to 34.9 in adult, unspecified obesity type  PLAN:  Pre-Diabetes Cassie [name] will continue to work on weight loss, exercise, and decreasing simple carbohydrates in her diet to help decrease the risk of diabetes. She was informed that eating too many simple carbohydrates or too many calories at one sitting increases the likelihood of GI  side effects.Cassie [name] agreed to continue her diet and exercise. We will recheck labs at her next in-office visit and she will follow up with [place] as directed to monitor her progress in 3 weeks.   I spent > than 50% of the 15 minute visit on counseling as documented in the note.  Obesity Cassie [name] is currently in the action stage of change. As such, her goal is to continue with weight loss efforts.  She has agreed to follow the Category 3 plan. Cassie [name] has been instructed to work up to a goal of 150 minutes of combined cardio and strengthening exercise per week for weight loss and overall health benefits. We discussed the following Behavioral Modification Strategies today: increasing lean protein intake, better snacking choices, and work on meal planning and easy cooking plans.  Cassie [name] has agreed to follow up with our clinic in 3 weeks. She was informed of the importance of frequent follow up visits to maximize her success with intensive lifestyle modifications for her multiple health conditions.  ALLERGIES: No Known Allergies  MEDICATIONS: Current Outpatient Medications on File Prior to Visit  Medication Sig Dispense Refill  . Vitamin D, Ergocalciferol, (DRISDOL) 1.25 MG (50000 UT) CAPS capsule Take 1 capsule (50,000 Units total) by mouth every 7 (seven) days. 12 capsule 0   No current facility-administered medications on file prior to visit.     PAST MEDICAL HISTORY: Past Medical History:  Diagnosis Date  . Infertility, female   . Thyroid disease  thyroid nodules    PAST SURGICAL HISTORY: Past Surgical History:  Procedure Laterality Date  . BREAST BIOPSY  08/2017   left breast  . ivf     . KNEE ARTHROSCOPY    . polpys      SOCIAL HISTORY: Social History   Tobacco Use  . Smoking status: Never Smoker  . Smokeless tobacco: Never Used  Substance Use Topics  . Alcohol use: No    Alcohol/week: 0.0 standard drinks  . Drug use: No    FAMILY HISTORY: Family History  Problem Relation  Age of Onset  . Cancer Mother   . Cancer Maternal Aunt   . Diabetes Father   . Hypertension Father     ROS: Review of Systems  Endo/Heme/Allergies:       Positive for polyphagia. Negative for hypoglycemia.    PHYSICAL EXAM: Pt in no acute distress  RECENT LABS AND TESTS: BMET    Component Value Date/Time   NA 142 05/20/2018 1403   K 4.4 05/20/2018 1403   CL 104 05/20/2018 1403   CO2 22 05/20/2018 1403   GLUCOSE 87 05/20/2018 1403   GLUCOSE 93 06/29/2014 1011   BUN 12 05/20/2018 1403   CREATININE 0.70 05/20/2018 1403   CREATININE 0.60 06/29/2014 1011   CALCIUM 9.4 05/20/2018 1403   GFRNONAA 104 05/20/2018 1403   GFRAA 120 05/20/2018 1403   Lab Results  Component Value Date   HGBA1C 5.7 (H) 05/20/2018   HGBA1C 5.4 09/25/2016   HGBA1C 5.8 (H) 06/29/2014   Lab Results  Component Value Date   INSULIN 5.8 05/20/2018   CBC    Component Value Date/Time   WBC 7.6 05/20/2018 1403   WBC 8.6 06/29/2014 1011   RBC 4.18 05/20/2018 1403   RBC 4.25 06/29/2014 1011   HGB 13.5 05/20/2018 1403   HCT 39.3 05/20/2018 1403   PLT 252 09/25/2016 1336   MCV 94 05/20/2018 1403   MCH 32.3 05/20/2018 1403   MCH 31.3 06/29/2014 1011   MCHC 34.4 05/20/2018 1403   MCHC 33.9 06/29/2014 1011   RDW 12.5 05/20/2018 1403   LYMPHSABS 2.9 05/20/2018 1403   MONOABS 0.7 06/29/2014 1011   EOSABS 0.1 05/20/2018 1403   BASOSABS 0.0 05/20/2018 1403   Iron/TIBC/Ferritin/ %Sat    Component Value Date/Time   IRON 149 09/25/2016 1336   TIBC 277 09/25/2016 1336   FERRITIN 32 09/25/2016 1336   IRONPCTSAT 54 09/25/2016 1336   Lipid Panel     Component Value Date/Time   CHOL 170 05/20/2018 1403   TRIG 63 05/20/2018 1403   HDL 50 05/20/2018 1403   CHOLHDL 3.2 06/29/2014 1011   VLDL 13 06/29/2014 1011   LDLCALC 107 (H) 05/20/2018 1403   Hepatic Function Panel     Component Value Date/Time   PROT 7.3 05/20/2018 1403   ALBUMIN 4.2 05/20/2018 1403   AST 13 05/20/2018 1403   ALT 11  05/20/2018 1403   ALKPHOS 91 05/20/2018 1403   BILITOT 0.4 05/20/2018 1403      Component Value Date/Time   TSH 0.556 05/20/2018 1403   TSH 0.716 09/25/2016 1336   TSH 0.48 01/24/2016 1122   Results for MORTELLARO, Derrian SUZANNE "Vinnie Level" (MRN [id number]) as of 09/07/2018 17:20  Ref. Range 05/20/2018 14:03  Vitamin D, 25-Hydroxy Latest Ref Range: 30.0 - 100.0 ng/mL 25.1 (L)     I, Marcille Blanco, CMA, am acting as transcriptionist for Starlyn Skeans, MD I have reviewed the above documentation for accuracy and  completeness, and I agree with the above. -Dennard Nip, MD

## 2018-09-23 ENCOUNTER — Other Ambulatory Visit: Payer: Self-pay

## 2018-09-23 ENCOUNTER — Encounter (INDEPENDENT_AMBULATORY_CARE_PROVIDER_SITE_OTHER): Payer: Self-pay | Admitting: Family Medicine

## 2018-09-23 ENCOUNTER — Telehealth (INDEPENDENT_AMBULATORY_CARE_PROVIDER_SITE_OTHER): Payer: 59 | Admitting: Family Medicine

## 2018-09-23 DIAGNOSIS — R7303 Prediabetes: Secondary | ICD-10-CM

## 2018-09-23 DIAGNOSIS — Z6834 Body mass index (BMI) 34.0-34.9, adult: Secondary | ICD-10-CM | POA: Diagnosis not present

## 2018-09-23 DIAGNOSIS — E669 Obesity, unspecified: Secondary | ICD-10-CM | POA: Diagnosis not present

## 2018-09-23 DIAGNOSIS — F419 Anxiety disorder, unspecified: Secondary | ICD-10-CM | POA: Insufficient documentation

## 2018-09-23 DIAGNOSIS — E559 Vitamin D deficiency, unspecified: Secondary | ICD-10-CM

## 2018-09-24 NOTE — Progress Notes (Signed)
Office: [phone]  /  Fax: [phone] TeleHealth Visit:  Cassie [name] has verbally consented to this TeleHealth visit today. The patient is located at home, the provider is located at the [facility] office. The participants in this visit include the listed provider and patient and any and all parties involved. The visit was conducted today via WebEx.  HPI:   Chief Complaint: OBESITY Cassie [name] is here to discuss her progress with her obesity treatment plan. She is on the Category 3 plan and is following her eating plan approximately 80 % of the time. She states she is biking and walking 35 to 45 minutes 3 times per week. Cassie [name] continues to do well maintaining her weight. She has had family staying with her for the last two weeks and they often eat poorly, and this has affected her meal planning and temptations, etc. We were unable to weigh the patient today for this TeleHealth visit. She feels as if she has maintained weight since her last visit. She has lost 4 lbs since starting treatment with [place].  Pre-Diabetes Cassie [name] has a diagnosis of prediabetes based on her elevated Hgb A1c and was informed this puts her at greater risk of developing diabetes. She is attempting to control her prediabetes with diet. She continues to work on diet and exercise to decrease risk of diabetes. She denies polyphagia, when she is on her eating plan. Brixton denies nausea or vomiting.  Vitamin D deficiency Cassie [name] has a diagnosis of vitamin D deficiency. Cassie [name] is stable on vit D, but she is not yet at goal. She denies nausea, vomiting or muscle weakness. Cassie [name] is due for labs soon.  ASSESSMENT AND PLAN:  Prediabetes  Vitamin D deficiency  Class 1 obesity with serious comorbidity and body mass index (BMI) of 34.0 to 34.9 in adult, unspecified obesity type  PLAN:  Pre-Diabetes Cassie [name] will continue to work on weight loss, exercise, and decreasing simple carbohydrates in her diet to help decrease the  risk of diabetes. She was informed that eating too many simple carbohydrates or too many calories at one sitting increases the likelihood of GI side effects. We will recheck labs in 1 month and Cassie [name] agreed to follow up with [place] as directed to monitor her progress.  Vitamin D Deficiency Cassie [name] was informed that low vitamin D levels contributes to fatigue and are associated with obesity, breast, and colon cancer. She will continue to take prescription Vit D @50 ,000 IU every week and will follow up for routine testing of vitamin D, at least 2-3 times per year. She was informed of the risk of over-replacement of vitamin D and agrees to not increase her dose unless she discusses this with [place] first. We will follow and Ona agrees to follow up in 2 to 3 weeks.  I spent > than 50% of the 25 minute visit on counseling as documented in the note.  Obesity Cassie [name] is currently in the action stage of change. As such, her goal is to continue with weight loss efforts She has agreed to follow the Category 3 plan Cassie [name] has been instructed to work up to a goal of 150 minutes of combined cardio and strengthening exercise per week for weight loss and overall health benefits. We discussed the following Behavioral Modification Strategies today: panning for success, work on meal planning and easy cooking plans and dealing with family or coworker sabotage  Grettell has agreed to follow up with our clinic in 2 to 3 weeks. She was  informed of the importance of frequent follow up visits to maximize her success with intensive lifestyle modifications for her multiple health conditions.  ALLERGIES: No Known Allergies  MEDICATIONS: Current Outpatient Medications on File Prior to Visit  Medication Sig Dispense Refill  . Vitamin D, Ergocalciferol, (DRISDOL) 1.25 MG (50000 UT) CAPS capsule Take 1 capsule (50,000 Units total) by mouth every 7 (seven) days. 12 capsule 0   No current facility-administered medications on file prior to  visit.     PAST MEDICAL HISTORY: Past Medical History:  Diagnosis Date  . Infertility, female   . Thyroid disease    thyroid nodules    PAST SURGICAL HISTORY: Past Surgical History:  Procedure Laterality Date  . BREAST BIOPSY  08/2017   left breast  . ivf     . KNEE ARTHROSCOPY    . polpys      SOCIAL HISTORY: Social History   Tobacco Use  . Smoking status: Never Smoker  . Smokeless tobacco: Never Used  Substance Use Topics  . Alcohol use: No    Alcohol/week: 0.0 standard drinks  . Drug use: No    FAMILY HISTORY: Family History  Problem Relation Age of Onset  . Cancer Mother   . Cancer Maternal Aunt   . Diabetes Father   . Hypertension Father     ROS: Review of Systems  Constitutional: Negative for weight loss.  Gastrointestinal: Negative for nausea and vomiting.  Musculoskeletal:       Negative for muscle weakness  Endo/Heme/Allergies:       Negative for polyphagia    PHYSICAL EXAM: Pt in no acute distress  RECENT LABS AND TESTS: BMET    Component Value Date/Time   NA 142 05/20/2018 1403   K 4.4 05/20/2018 1403   CL 104 05/20/2018 1403   CO2 22 05/20/2018 1403   GLUCOSE 87 05/20/2018 1403   GLUCOSE 93 06/29/2014 1011   BUN 12 05/20/2018 1403   CREATININE 0.70 05/20/2018 1403   CREATININE 0.60 06/29/2014 1011   CALCIUM 9.4 05/20/2018 1403   GFRNONAA 104 05/20/2018 1403   GFRAA 120 05/20/2018 1403   Lab Results  Component Value Date   HGBA1C 5.7 (H) 05/20/2018   HGBA1C 5.4 09/25/2016   HGBA1C 5.8 (H) 06/29/2014   Lab Results  Component Value Date   INSULIN 5.8 05/20/2018   CBC    Component Value Date/Time   WBC 7.6 05/20/2018 1403   WBC 8.6 06/29/2014 1011   RBC 4.18 05/20/2018 1403   RBC 4.25 06/29/2014 1011   HGB 13.5 05/20/2018 1403   HCT 39.3 05/20/2018 1403   PLT 252 09/25/2016 1336   MCV 94 05/20/2018 1403   MCH 32.3 05/20/2018 1403   MCH 31.3 06/29/2014 1011   MCHC 34.4 05/20/2018 1403   MCHC 33.9 06/29/2014 1011    RDW 12.5 05/20/2018 1403   LYMPHSABS 2.9 05/20/2018 1403   MONOABS 0.7 06/29/2014 1011   EOSABS 0.1 05/20/2018 1403   BASOSABS 0.0 05/20/2018 1403   Iron/TIBC/Ferritin/ %Sat    Component Value Date/Time   IRON 149 09/25/2016 1336   TIBC 277 09/25/2016 1336   FERRITIN 32 09/25/2016 1336   IRONPCTSAT 54 09/25/2016 1336   Lipid Panel     Component Value Date/Time   CHOL 170 05/20/2018 1403   TRIG 63 05/20/2018 1403   HDL 50 05/20/2018 1403   CHOLHDL 3.2 06/29/2014 1011   VLDL 13 06/29/2014 1011   LDLCALC 107 (H) 05/20/2018 1403   Hepatic Function Panel  Component Value Date/Time   PROT 7.3 05/20/2018 1403   ALBUMIN 4.2 05/20/2018 1403   AST 13 05/20/2018 1403   ALT 11 05/20/2018 1403   ALKPHOS 91 05/20/2018 1403   BILITOT 0.4 05/20/2018 1403      Component Value Date/Time   TSH 0.556 05/20/2018 1403   TSH 0.716 09/25/2016 1336   TSH 0.48 01/24/2016 1122     Ref. Range 05/20/2018 14:03  Vitamin D, 25-Hydroxy Latest Ref Range: 30.0 - 100.0 ng/mL 25.1 (L)    I, Doreene Nest, am acting as transcriptionist for Dennard Nip, MD I have reviewed the above documentation for accuracy and completeness, and I agree with the above. -Dennard Nip, MD

## 2018-10-07 ENCOUNTER — Encounter (INDEPENDENT_AMBULATORY_CARE_PROVIDER_SITE_OTHER): Payer: Self-pay | Admitting: Family Medicine

## 2018-10-07 ENCOUNTER — Telehealth (INDEPENDENT_AMBULATORY_CARE_PROVIDER_SITE_OTHER): Payer: 59 | Admitting: Family Medicine

## 2018-10-07 ENCOUNTER — Other Ambulatory Visit: Payer: Self-pay

## 2018-10-07 DIAGNOSIS — E669 Obesity, unspecified: Secondary | ICD-10-CM | POA: Diagnosis not present

## 2018-10-07 DIAGNOSIS — R7303 Prediabetes: Secondary | ICD-10-CM

## 2018-10-07 DIAGNOSIS — Z6834 Body mass index (BMI) 34.0-34.9, adult: Secondary | ICD-10-CM

## 2018-10-08 NOTE — Progress Notes (Signed)
Office: [phone]  /  Fax: [phone] TeleHealth Visit:  Cassie [name] has verbally consented to this TeleHealth visit today. The patient is located at home, the provider is located at the [facility] office. The participants in this visit include the listed provider and patient. The visit was conducted today via Webex.  HPI:   Chief Complaint: OBESITY Cassie [name] is here to discuss her progress with her obesity treatment plan. She is on the Category 3 plan and is following her eating plan approximately 75% of the time. She states she is walking/biking 45 minutes 3 times per week. [name] feels she has done well maintaining her weight loss since her last visit. She has had her in-laws as company the last 2 weeks and has been unable to be strict about her eating plan, but is ready to get back on track. We were unable to weigh the patient today for this TeleHealth visit. She feels as if she has maintained her weight since her last visit. She has lost 4 lbs since starting treatment with [place].  Pre-Diabetes Cassie [name] has a diagnosis of prediabetes based on her elevated Hgb A1c and was informed this puts her at greater risk of developing diabetes. She is not taking metformin currently and continues to do well with her diet and weight loss. Hunger is controlled, she is meeting her protein goals, and has decreased simple carbs. She denies nausea, vomiting, or hypoglycemia.  ASSESSMENT AND PLAN:  Prediabetes  Class 1 obesity with serious comorbidity and body mass index (BMI) of 34.0 to 34.9 in adult, unspecified obesity type  PLAN:  Pre-Diabetes Cassie [name] will continue to work on weight loss, exercise, and decreasing simple carbohydrates in her diet to help decrease the risk of diabetes. We dicussed metformin including benefits and risks. She was informed that eating too many simple carbohydrates or too many calories at one sitting increases the likelihood of GI side effects. [name] will  continue diet and exercise. She will have labs checked at her next in-office visit.  I spent > than 50% of the 25 minute visit on counseling as documented in the note.  Obesity Cassie [name] is currently in the action stage of change. As such, her goal is to continue with weight loss efforts She has agreed to follow the Category 3 plan Cassie [name] has been instructed to work up to a goal of 150 minutes of combined cardio and strengthening exercise per week for weight loss and overall health benefits. We discussed the following Behavioral Modification Strategies today: decreasing simple carbohydrates and dealing with family or coworker sabotage.  Cassie [name] has agreed to follow-up with our clinic in 2-3 weeks. She was informed of the importance of frequent follow-up visits to maximize her success with intensive lifestyle modifications for her multiple health conditions.  ALLERGIES: No Known Allergies  MEDICATIONS: Current Outpatient Medications on File Prior to Visit  Medication Sig Dispense Refill  . Vitamin D, Ergocalciferol, (DRISDOL) 1.25 MG (50000 UT) CAPS capsule Take 1 capsule (50,000 Units total) by mouth every 7 (seven) days. 12 capsule 0   No current facility-administered medications on file prior to visit.     PAST MEDICAL HISTORY: Past Medical History:  Diagnosis Date  . Infertility, female   . Thyroid disease    thyroid nodules    PAST SURGICAL HISTORY: Past Surgical History:  Procedure Laterality Date  . BREAST BIOPSY  08/2017   left breast  . ivf     . KNEE ARTHROSCOPY    .  polpys      SOCIAL HISTORY: Social History   Tobacco Use  . Smoking status: Never Smoker  . Smokeless tobacco: Never Used  Substance Use Topics  . Alcohol use: No    Alcohol/week: 0.0 standard drinks  . Drug use: No    FAMILY HISTORY: Family History  Problem Relation Age of Onset  . Cancer Mother   . Cancer Maternal Aunt   . Diabetes Father   . Hypertension Father    ROS: Review of Systems   Gastrointestinal: Negative for nausea and vomiting.  Endo/Heme/Allergies:       Negative for hypoglycemia.   PHYSICAL EXAM: Pt in no acute distress  RECENT LABS AND TESTS: BMET    Component Value Date/Time   NA 142 05/20/2018 1403   K 4.4 05/20/2018 1403   CL 104 05/20/2018 1403   CO2 22 05/20/2018 1403   GLUCOSE 87 05/20/2018 1403   GLUCOSE 93 06/29/2014 1011   BUN 12 05/20/2018 1403   CREATININE 0.70 05/20/2018 1403   CREATININE 0.60 06/29/2014 1011   CALCIUM 9.4 05/20/2018 1403   GFRNONAA 104 05/20/2018 1403   GFRAA 120 05/20/2018 1403   Lab Results  Component Value Date   HGBA1C 5.7 (H) 05/20/2018   HGBA1C 5.4 09/25/2016   HGBA1C 5.8 (H) 06/29/2014   Lab Results  Component Value Date   INSULIN 5.8 05/20/2018   CBC    Component Value Date/Time   WBC 7.6 05/20/2018 1403   WBC 8.6 06/29/2014 1011   RBC 4.18 05/20/2018 1403   RBC 4.25 06/29/2014 1011   HGB 13.5 05/20/2018 1403   HCT 39.3 05/20/2018 1403   PLT 252 09/25/2016 1336   MCV 94 05/20/2018 1403   MCH 32.3 05/20/2018 1403   MCH 31.3 06/29/2014 1011   MCHC 34.4 05/20/2018 1403   MCHC 33.9 06/29/2014 1011   RDW 12.5 05/20/2018 1403   LYMPHSABS 2.9 05/20/2018 1403   MONOABS 0.7 06/29/2014 1011   EOSABS 0.1 05/20/2018 1403   BASOSABS 0.0 05/20/2018 1403   Iron/TIBC/Ferritin/ %Sat    Component Value Date/Time   IRON 149 09/25/2016 1336   TIBC 277 09/25/2016 1336   FERRITIN 32 09/25/2016 1336   IRONPCTSAT 54 09/25/2016 1336   Lipid Panel     Component Value Date/Time   CHOL 170 05/20/2018 1403   TRIG 63 05/20/2018 1403   HDL 50 05/20/2018 1403   CHOLHDL 3.2 06/29/2014 1011   VLDL 13 06/29/2014 1011   LDLCALC 107 (H) 05/20/2018 1403   Hepatic Function Panel     Component Value Date/Time   PROT 7.3 05/20/2018 1403   ALBUMIN 4.2 05/20/2018 1403   AST 13 05/20/2018 1403   ALT 11 05/20/2018 1403   ALKPHOS 91 05/20/2018 1403   BILITOT 0.4 05/20/2018 1403      Component Value Date/Time    TSH 0.556 05/20/2018 1403   TSH 0.716 09/25/2016 1336   TSH 0.48 01/24/2016 1122   Results for MASULLO, Emilyrose SUZANNE "Cassie [name]" (MRN [id number]) as of 10/08/2018 11:43  Ref. Range 05/20/2018 14:03  Vitamin D, 25-Hydroxy Latest Ref Range: 30.0 - 100.0 ng/mL 25.1 (L)   I, Michaelene Song, am acting as Location manager for Dennard Nip, MD I have reviewed the above documentation for accuracy and completeness, and I agree with the above. -Dennard Nip, MD

## 2018-10-21 ENCOUNTER — Telehealth (INDEPENDENT_AMBULATORY_CARE_PROVIDER_SITE_OTHER): Payer: 59 | Admitting: Family Medicine

## 2018-10-21 ENCOUNTER — Encounter (INDEPENDENT_AMBULATORY_CARE_PROVIDER_SITE_OTHER): Payer: Self-pay | Admitting: Family Medicine

## 2018-10-21 ENCOUNTER — Other Ambulatory Visit: Payer: Self-pay

## 2018-10-21 DIAGNOSIS — Z6834 Body mass index (BMI) 34.0-34.9, adult: Secondary | ICD-10-CM

## 2018-10-21 DIAGNOSIS — R7303 Prediabetes: Secondary | ICD-10-CM | POA: Diagnosis not present

## 2018-10-21 DIAGNOSIS — E669 Obesity, unspecified: Secondary | ICD-10-CM

## 2018-10-25 NOTE — Progress Notes (Signed)
Office: [phone]  /  Fax: [phone] TeleHealth Visit:  Cassie [name] has verbally consented to this TeleHealth visit today. The patient is located at home, the provider is located at the [facility] office. The participants in this visit include the listed provider and patient. The visit was conducted today via webex.  HPI:   Chief Complaint: OBESITY Cassie [name] is here to discuss her progress with her obesity treatment plan. She is on the Category 3 plan and is following her eating plan approximately 90 % of the time. She states she is walking and biking for 30-45 minutes 3 times per week. Cassie [name] continues to do well with weight loss and has lost another 1-2 lbs since our last visit on her Category 3 plan. She is exercising regularly and is sleeping well.  We were unable to weigh the patient today for this TeleHealth visit. She feels as if she lost 1-2 lbs since her last visit. She has lost 4 lbs since starting treatment with [place].  Pre-Diabetes Cassie [name] has a diagnosis of pre-diabetes based on her elevated Hgb A1c and was informed this puts her at greater risk of developing diabetes. She continues to do very well with her diet prescription and weight loss. She has increased exercise and notes decreased polyphagia on her diet prescription. She is not taking metformin currently, and she denies hypoglycemia. Last A1c was 5.7.  ASSESSMENT AND PLAN:  Prediabetes  Class 1 obesity with serious comorbidity and body mass index (BMI) of 34.0 to 34.9 in adult, unspecified obesity type  PLAN:  Pre-Diabetes Cassie [name] will continue to work on diet, exercise, and weight loss, and decreasing simple carbohydrates in her diet to help decrease the risk of diabetes. We dicussed metformin including benefits and risks. She was informed that eating too many simple carbohydrates or too many calories at one sitting increases the likelihood of GI side effects. Cassie [name] declined metformin for now and a  prescription was not written today. We will recheck labs in 1 month. Ame agrees to follow up with our clinic in 3 weeks as directed to monitor her progress.  I spent > than 50% of the 25 minute visit on counseling as documented in the note.  Obesity [name] is currently in the action stage of change. As such, her goal is to continue with weight loss efforts She has agreed to follow the Category 3 plan Marlin has been instructed to work up to a goal of 150 minutes of combined cardio and strengthening exercise per week for weight loss and overall health benefits. We discussed the following Behavioral Modification Strategies today: dealing with family or coworker sabotage   Cassie [name] has agreed to follow up with our clinic in 3 weeks. She was informed of the importance of frequent follow up visits to maximize her success with intensive lifestyle modifications for her multiple health conditions.  ALLERGIES: No Known Allergies  MEDICATIONS: Current Outpatient Medications on File Prior to Visit  Medication Sig Dispense Refill  . Vitamin D, Ergocalciferol, (DRISDOL) 1.25 MG (50000 UT) CAPS capsule Take 1 capsule (50,000 Units total) by mouth every 7 (seven) days. 12 capsule 0   No current facility-administered medications on file prior to visit.     PAST MEDICAL HISTORY: Past Medical History:  Diagnosis Date  . Infertility, female   . Thyroid disease    thyroid nodules    PAST SURGICAL HISTORY: Past Surgical History:  Procedure Laterality Date  . BREAST BIOPSY  08/2017   left breast  .  ivf     . KNEE ARTHROSCOPY    . polpys      SOCIAL HISTORY: Social History   Tobacco Use  . Smoking status: Never Smoker  . Smokeless tobacco: Never Used  Substance Use Topics  . Alcohol use: No    Alcohol/week: 0.0 standard drinks  . Drug use: No    FAMILY HISTORY: Family History  Problem Relation Age of Onset  . Cancer Mother   . Cancer Maternal Aunt   . Diabetes Father   . Hypertension  Father     ROS: Review of Systems  Constitutional: Positive for weight loss.  Endo/Heme/Allergies:       Positive polyphagia Negative hypoglycemia    PHYSICAL EXAM: Pt in no acute distress  RECENT LABS AND TESTS: BMET    Component Value Date/Time   NA 142 05/20/2018 1403   K 4.4 05/20/2018 1403   CL 104 05/20/2018 1403   CO2 22 05/20/2018 1403   GLUCOSE 87 05/20/2018 1403   GLUCOSE 93 06/29/2014 1011   BUN 12 05/20/2018 1403   CREATININE 0.70 05/20/2018 1403   CREATININE 0.60 06/29/2014 1011   CALCIUM 9.4 05/20/2018 1403   GFRNONAA 104 05/20/2018 1403   GFRAA 120 05/20/2018 1403   Lab Results  Component Value Date   HGBA1C 5.7 (H) 05/20/2018   HGBA1C 5.4 09/25/2016   HGBA1C 5.8 (H) 06/29/2014   Lab Results  Component Value Date   INSULIN 5.8 05/20/2018   CBC    Component Value Date/Time   WBC 7.6 05/20/2018 1403   WBC 8.6 06/29/2014 1011   RBC 4.18 05/20/2018 1403   RBC 4.25 06/29/2014 1011   HGB 13.5 05/20/2018 1403   HCT 39.3 05/20/2018 1403   PLT 252 09/25/2016 1336   MCV 94 05/20/2018 1403   MCH 32.3 05/20/2018 1403   MCH 31.3 06/29/2014 1011   MCHC 34.4 05/20/2018 1403   MCHC 33.9 06/29/2014 1011   RDW 12.5 05/20/2018 1403   LYMPHSABS 2.9 05/20/2018 1403   MONOABS 0.7 06/29/2014 1011   EOSABS 0.1 05/20/2018 1403   BASOSABS 0.0 05/20/2018 1403   Iron/TIBC/Ferritin/ %Sat    Component Value Date/Time   IRON 149 09/25/2016 1336   TIBC 277 09/25/2016 1336   FERRITIN 32 09/25/2016 1336   IRONPCTSAT 54 09/25/2016 1336   Lipid Panel     Component Value Date/Time   CHOL 170 05/20/2018 1403   TRIG 63 05/20/2018 1403   HDL 50 05/20/2018 1403   CHOLHDL 3.2 06/29/2014 1011   VLDL 13 06/29/2014 1011   LDLCALC 107 (H) 05/20/2018 1403   Hepatic Function Panel     Component Value Date/Time   PROT 7.3 05/20/2018 1403   ALBUMIN 4.2 05/20/2018 1403   AST 13 05/20/2018 1403   ALT 11 05/20/2018 1403   ALKPHOS 91 05/20/2018 1403   BILITOT 0.4  05/20/2018 1403      Component Value Date/Time   TSH 0.556 05/20/2018 1403   TSH 0.716 09/25/2016 1336   TSH 0.48 01/24/2016 1122      I, Trixie Dredge, am acting as Location manager for Dennard Nip, MD I have reviewed the above documentation for accuracy and completeness, and I agree with the above. -Dennard Nip, MD

## 2018-11-11 ENCOUNTER — Other Ambulatory Visit: Payer: Self-pay

## 2018-11-11 ENCOUNTER — Encounter (INDEPENDENT_AMBULATORY_CARE_PROVIDER_SITE_OTHER): Payer: Self-pay | Admitting: Family Medicine

## 2018-11-11 ENCOUNTER — Telehealth (INDEPENDENT_AMBULATORY_CARE_PROVIDER_SITE_OTHER): Payer: 59 | Admitting: Family Medicine

## 2018-11-11 DIAGNOSIS — Z6834 Body mass index (BMI) 34.0-34.9, adult: Secondary | ICD-10-CM | POA: Diagnosis not present

## 2018-11-11 DIAGNOSIS — R7303 Prediabetes: Secondary | ICD-10-CM

## 2018-11-11 DIAGNOSIS — E669 Obesity, unspecified: Secondary | ICD-10-CM

## 2018-11-12 NOTE — Progress Notes (Signed)
Office: [phone]  /  Fax: [phone] TeleHealth Visit:  Cassie [name] has verbally consented to this TeleHealth visit today. The patient is located at home, the provider is located at the [facility] office. The participants in this visit include the listed provider and patient. The visit was conducted today via webex.  HPI:   Chief Complaint: OBESITY Cassie [name] is here to discuss her progress with her obesity treatment plan. She is on the Category 3 plan and is following her eating plan approximately 90 % of the time. She states she is biking 20 miles and hiking 10 miles on the weekends, and on the elliptical for 20 minutes 2 times per week. Cassie [name] states her weight has increased 1-2 lbs while on vacation, and she has already gotten back on track with her diet prescription. She has increased her exercise to try to help weight loss restart. Her weight this afternoon was 215 lbs. We were unable to weigh the patient today for this TeleHealth visit. She feels as if she has gained 1-2 lbs since her last visit. She has lost 4 lbs since starting treatment with [place].  Pre-Diabetes Cassie [name] has a diagnosis of pre-diabetes based on her elevated Hgb A1c and was informed this puts her at greater risk of developing diabetes. She is not taking metformin currently and she is doing well with diet, and has increased exercise. She notes decreased polyphagia while on her diet prescription. She denies hypoglycemia. She continues to work on diet and exercise to decrease risk of diabetes.  ASSESSMENT AND PLAN:  No diagnosis found.  PLAN:  Pre-Diabetes Cassie [name] will continue to work on diet, exercise, and weight loss, and decreasing simple carbohydrates in her diet to help decrease the risk of diabetes. We dicussed metformin including benefits and risks. She was informed that eating too many simple carbohydrates or too many calories at one sitting increases the likelihood of GI side effects. We will  recheck labs at her next in office visit. Cassie [name] agrees to follow up with our clinic in 3 weeks as directed to monitor her progress.  I spent > than 50% of the 15 minute visit on counseling as documented in the note.  Obesity Shradha is currently in the action stage of change. As such, her goal is to continue with weight loss efforts She has agreed to follow the Category 3 plan Cassie [name] has been instructed to work up to a goal of 150 minutes of combined cardio and strengthening exercise per week for weight loss and overall health benefits. We discussed the following Behavioral Modification Strategies today: increasing lean protein intake and decreasing simple carbohydrates    Cassie [name] has agreed to follow up with our clinic in 3 weeks. She was informed of the importance of frequent follow up visits to maximize her success with intensive lifestyle modifications for her multiple health conditions.  ALLERGIES: No Known Allergies  MEDICATIONS: Current Outpatient Medications on File Prior to Visit  Medication Sig Dispense Refill  . Vitamin D, Ergocalciferol, (DRISDOL) 1.25 MG (50000 UT) CAPS capsule Take 1 capsule (50,000 Units total) by mouth every 7 (seven) days. 12 capsule 0   No current facility-administered medications on file prior to visit.     PAST MEDICAL HISTORY: Past Medical History:  Diagnosis Date  . Infertility, female   . Thyroid disease    thyroid nodules    PAST SURGICAL HISTORY: Past Surgical History:  Procedure Laterality Date  . BREAST BIOPSY  08/2017   left breast  .  ivf     . KNEE ARTHROSCOPY    . polpys      SOCIAL HISTORY: Social History   Tobacco Use  . Smoking status: Never Smoker  . Smokeless tobacco: Never Used  Substance Use Topics  . Alcohol use: No    Alcohol/week: 0.0 standard drinks  . Drug use: No    FAMILY HISTORY: Family History  Problem Relation Age of Onset  . Cancer Mother   . Cancer Maternal Aunt   . Diabetes Father   . Hypertension  Father     ROS: Review of Systems  Constitutional: Negative for weight loss.  Endo/Heme/Allergies:       Negative hypoglycemia Positive polyphagia    PHYSICAL EXAM: Pt in no acute distress  RECENT LABS AND TESTS: BMET    Component Value Date/Time   NA 142 05/20/2018 1403   K 4.4 05/20/2018 1403   CL 104 05/20/2018 1403   CO2 22 05/20/2018 1403   GLUCOSE 87 05/20/2018 1403   GLUCOSE 93 06/29/2014 1011   BUN 12 05/20/2018 1403   CREATININE 0.70 05/20/2018 1403   CREATININE 0.60 06/29/2014 1011   CALCIUM 9.4 05/20/2018 1403   GFRNONAA 104 05/20/2018 1403   GFRAA 120 05/20/2018 1403   Lab Results  Component Value Date   HGBA1C 5.7 (H) 05/20/2018   HGBA1C 5.4 09/25/2016   HGBA1C 5.8 (H) 06/29/2014   Lab Results  Component Value Date   INSULIN 5.8 05/20/2018   CBC    Component Value Date/Time   WBC 7.6 05/20/2018 1403   WBC 8.6 06/29/2014 1011   RBC 4.18 05/20/2018 1403   RBC 4.25 06/29/2014 1011   HGB 13.5 05/20/2018 1403   HCT 39.3 05/20/2018 1403   PLT 252 09/25/2016 1336   MCV 94 05/20/2018 1403   MCH 32.3 05/20/2018 1403   MCH 31.3 06/29/2014 1011   MCHC 34.4 05/20/2018 1403   MCHC 33.9 06/29/2014 1011   RDW 12.5 05/20/2018 1403   LYMPHSABS 2.9 05/20/2018 1403   MONOABS 0.7 06/29/2014 1011   EOSABS 0.1 05/20/2018 1403   BASOSABS 0.0 05/20/2018 1403   Iron/TIBC/Ferritin/ %Sat    Component Value Date/Time   IRON 149 09/25/2016 1336   TIBC 277 09/25/2016 1336   FERRITIN 32 09/25/2016 1336   IRONPCTSAT 54 09/25/2016 1336   Lipid Panel     Component Value Date/Time   CHOL 170 05/20/2018 1403   TRIG 63 05/20/2018 1403   HDL 50 05/20/2018 1403   CHOLHDL 3.2 06/29/2014 1011   VLDL 13 06/29/2014 1011   LDLCALC 107 (H) 05/20/2018 1403   Hepatic Function Panel     Component Value Date/Time   PROT 7.3 05/20/2018 1403   ALBUMIN 4.2 05/20/2018 1403   AST 13 05/20/2018 1403   ALT 11 05/20/2018 1403   ALKPHOS 91 05/20/2018 1403   BILITOT 0.4  05/20/2018 1403      Component Value Date/Time   TSH 0.556 05/20/2018 1403   TSH 0.716 09/25/2016 1336   TSH 0.48 01/24/2016 1122      I, Trixie Dredge, am acting as Location manager for Dennard Nip, MD I have reviewed the above documentation for accuracy and completeness, and I agree with the above. -Dennard Nip, MD

## 2018-12-02 ENCOUNTER — Other Ambulatory Visit: Payer: Self-pay

## 2018-12-02 ENCOUNTER — Telehealth (INDEPENDENT_AMBULATORY_CARE_PROVIDER_SITE_OTHER): Payer: 59 | Admitting: Family Medicine

## 2018-12-02 ENCOUNTER — Encounter (INDEPENDENT_AMBULATORY_CARE_PROVIDER_SITE_OTHER): Payer: Self-pay | Admitting: Family Medicine

## 2018-12-02 DIAGNOSIS — E669 Obesity, unspecified: Secondary | ICD-10-CM

## 2018-12-02 DIAGNOSIS — Z6834 Body mass index (BMI) 34.0-34.9, adult: Secondary | ICD-10-CM

## 2018-12-02 DIAGNOSIS — E559 Vitamin D deficiency, unspecified: Secondary | ICD-10-CM | POA: Diagnosis not present

## 2018-12-07 NOTE — Progress Notes (Signed)
Office: [phone]  /  Fax: [phone] TeleHealth Visit:  Cassie [name] has verbally consented to this TeleHealth visit today. The patient is located at home, the provider is located at the [facility]. The participants in this visit include the listed provider and patient. The visit was conducted today via webex.  HPI:   Chief Complaint: OBESITY Cassie [name] is here to discuss her progress with her obesity treatment plan. She is on the Category 3 plan and is following her eating plan approximately 85 % of the time. She states she is bike riding, and on the treadmill and elliptical for 30-40 minutes 3 times per week. Cassie [name] continues to do very well with weight loss on her Category 3 eating plan. Her hunger is controlled and she is doing well with exercise. She is controlling emotional eating and is meal prepping well. She states her weight this morning was 211.6 lbs. We were unable to weigh the patient today for this TeleHealth visit. She feels as if she has lost 3-4 lbs since her last visit. She has lost 4 lbs since starting treatment with [place].  Vitamin D Deficiency Cassie [name] has a diagnosis of vitamin D deficiency. She is stable on prescription Vit D, but level is not yet at goal. She denies nausea, vomiting or muscle weakness.  ASSESSMENT AND PLAN:  Vitamin D deficiency  Class 1 obesity with serious comorbidity and body mass index (BMI) of 34.0 to 34.9 in adult, unspecified obesity type  PLAN:  Vitamin D Deficiency Cassie [name] was informed that low vitamin D levels contributes to fatigue and are associated with obesity, breast, and colon cancer. Cassie [name] agrees to continue taking prescription Vit D as is and will follow up for routine testing of vitamin D, at least 2-3 times per year. She was informed of the risk of over-replacement of vitamin D and agrees to not increase her dose unless she discusses this with [place] first. We will recheck labs in 1 month. Cassie [name] agrees to follow up  with our clinic in 2 to 3 weeks.  I spent > than 50% of the 15 minute visit on counseling as documented in the note.  Obesity Cassie [name] is currently in the action stage of change. As such, her goal is to continue with weight loss efforts She has agreed to follow the Category 3 plan Cassie [name] has been instructed to work up to a goal of 150 minutes of combined cardio and strengthening exercise per week for weight loss and overall health benefits. We discussed the following Behavioral Modification Strategies today: increasing lean protein intake and work on meal planning and easy cooking plans   Cassie [name] has agreed to follow up with our clinic in 2 to 3 weeks. She was informed of the importance of frequent follow up visits to maximize her success with intensive lifestyle modifications for her multiple health conditions.  ALLERGIES: No Known Allergies  MEDICATIONS: Current Outpatient Medications on File Prior to Visit  Medication Sig Dispense Refill  . Vitamin D, Ergocalciferol, (DRISDOL) 1.25 MG (50000 UT) CAPS capsule Take 1 capsule (50,000 Units total) by mouth every 7 (seven) days. 12 capsule 0   No current facility-administered medications on file prior to visit.     PAST MEDICAL HISTORY: Past Medical History:  Diagnosis Date  . Infertility, female   . Thyroid disease    thyroid nodules    PAST SURGICAL HISTORY: Past Surgical History:  Procedure Laterality Date  . BREAST BIOPSY  08/2017   left breast  .  ivf     . KNEE ARTHROSCOPY    . polpys      SOCIAL HISTORY: Social History   Tobacco Use  . Smoking status: Never Smoker  . Smokeless tobacco: Never Used  Substance Use Topics  . Alcohol use: No    Alcohol/week: 0.0 standard drinks  . Drug use: No    FAMILY HISTORY: Family History  Problem Relation Age of Onset  . Cancer Mother   . Cancer Maternal Aunt   . Diabetes Father   . Hypertension Father     ROS: Review of Systems  Constitutional: Positive for weight loss.   Gastrointestinal: Negative for nausea and vomiting.  Musculoskeletal:       Negative muscle weakness    PHYSICAL EXAM: Pt in no acute distress  RECENT LABS AND TESTS: BMET    Component Value Date/Time   NA 142 05/20/2018 1403   K 4.4 05/20/2018 1403   CL 104 05/20/2018 1403   CO2 22 05/20/2018 1403   GLUCOSE 87 05/20/2018 1403   GLUCOSE 93 06/29/2014 1011   BUN 12 05/20/2018 1403   CREATININE 0.70 05/20/2018 1403   CREATININE 0.60 06/29/2014 1011   CALCIUM 9.4 05/20/2018 1403   GFRNONAA 104 05/20/2018 1403   GFRAA 120 05/20/2018 1403   Lab Results  Component Value Date   HGBA1C 5.7 (H) 05/20/2018   HGBA1C 5.4 09/25/2016   HGBA1C 5.8 (H) 06/29/2014   Lab Results  Component Value Date   INSULIN 5.8 05/20/2018   CBC    Component Value Date/Time   WBC 7.6 05/20/2018 1403   WBC 8.6 06/29/2014 1011   RBC 4.18 05/20/2018 1403   RBC 4.25 06/29/2014 1011   HGB 13.5 05/20/2018 1403   HCT 39.3 05/20/2018 1403   PLT 252 09/25/2016 1336   MCV 94 05/20/2018 1403   MCH 32.3 05/20/2018 1403   MCH 31.3 06/29/2014 1011   MCHC 34.4 05/20/2018 1403   MCHC 33.9 06/29/2014 1011   RDW 12.5 05/20/2018 1403   LYMPHSABS 2.9 05/20/2018 1403   MONOABS 0.7 06/29/2014 1011   EOSABS 0.1 05/20/2018 1403   BASOSABS 0.0 05/20/2018 1403   Iron/TIBC/Ferritin/ %Sat    Component Value Date/Time   IRON 149 09/25/2016 1336   TIBC 277 09/25/2016 1336   FERRITIN 32 09/25/2016 1336   IRONPCTSAT 54 09/25/2016 1336   Lipid Panel     Component Value Date/Time   CHOL 170 05/20/2018 1403   TRIG 63 05/20/2018 1403   HDL 50 05/20/2018 1403   CHOLHDL 3.2 06/29/2014 1011   VLDL 13 06/29/2014 1011   LDLCALC 107 (H) 05/20/2018 1403   Hepatic Function Panel     Component Value Date/Time   PROT 7.3 05/20/2018 1403   ALBUMIN 4.2 05/20/2018 1403   AST 13 05/20/2018 1403   ALT 11 05/20/2018 1403   ALKPHOS 91 05/20/2018 1403   BILITOT 0.4 05/20/2018 1403      Component Value Date/Time    TSH 0.556 05/20/2018 1403   TSH 0.716 09/25/2016 1336   TSH 0.48 01/24/2016 1122      I, Cassie [name], am acting as Location manager for Dennard Nip, MD I have reviewed the above documentation for accuracy and completeness, and I agree with the above. -Dennard Nip, MD

## 2018-12-09 DIAGNOSIS — H524 Presbyopia: Secondary | ICD-10-CM | POA: Diagnosis not present

## 2018-12-09 DIAGNOSIS — H5213 Myopia, bilateral: Secondary | ICD-10-CM | POA: Diagnosis not present

## 2018-12-09 DIAGNOSIS — H52223 Regular astigmatism, bilateral: Secondary | ICD-10-CM | POA: Diagnosis not present

## 2018-12-23 ENCOUNTER — Telehealth (INDEPENDENT_AMBULATORY_CARE_PROVIDER_SITE_OTHER): Payer: 59 | Admitting: Family Medicine

## 2019-01-06 ENCOUNTER — Other Ambulatory Visit: Payer: Self-pay

## 2019-01-06 ENCOUNTER — Encounter (INDEPENDENT_AMBULATORY_CARE_PROVIDER_SITE_OTHER): Payer: Self-pay | Admitting: Family Medicine

## 2019-01-06 ENCOUNTER — Telehealth (INDEPENDENT_AMBULATORY_CARE_PROVIDER_SITE_OTHER): Payer: 59 | Admitting: Family Medicine

## 2019-01-06 DIAGNOSIS — R7303 Prediabetes: Secondary | ICD-10-CM | POA: Diagnosis not present

## 2019-01-06 DIAGNOSIS — E559 Vitamin D deficiency, unspecified: Secondary | ICD-10-CM | POA: Diagnosis not present

## 2019-01-06 DIAGNOSIS — E669 Obesity, unspecified: Secondary | ICD-10-CM | POA: Diagnosis not present

## 2019-01-06 DIAGNOSIS — Z6834 Body mass index (BMI) 34.0-34.9, adult: Secondary | ICD-10-CM

## 2019-01-06 MED ORDER — VITAMIN D (ERGOCALCIFEROL) 1.25 MG (50000 UNIT) PO CAPS: 50000.0000 [IU] | ORAL_CAPSULE | ORAL | 0 refills | Status: DC

## 2019-01-07 NOTE — Progress Notes (Signed)
Office: [phone]  /  Fax: [phone] TeleHealth Visit:  [name] has verbally consented to this TeleHealth visit today. The patient is located at home, the provider is located at the [facility]. The participants in this visit include the listed provider and patient. The visit was conducted today via [email or web address].  HPI:   Chief Complaint: OBESITY Cassie [name] is here to discuss her progress with her obesity treatment plan. She is on the Category 3 plan and is following her eating plan approximately 75 % of the time. She states she is biking, hiking, and on the elliptical for 45 minutes 4 times per week. Cassie [name] continues to maintain her weight loss well over the last month. She is exercising 4 times per week for 45 minutes of cardio. She has lost close to 25 lbs during the [place] pandemic.  We were unable to weigh the patient today for this TeleHealth visit. She feels as if she has maintained her weight since her last visit. She has lost 25 lbs since starting treatment with [place].  Vitamin D Deficiency Cassie [name] has a diagnosis of vitamin D deficiency. She is stable on prescription Vit D, but last level is not yet at goal. She denies nausea, vomiting or muscle weakness.  Pre-Diabetes Cassie [name] has a diagnosis of pre-diabetes based on her elevated Hgb A1c and was informed this puts her at greater risk of developing diabetes. She is not taking metformin currently and has been working on diet and exercise to decrease risk of diabetes. She notes decreased polyphagia.  ASSESSMENT AND PLAN:  Prediabetes - Plan: Insulin, random, Hemoglobin A1c  Vitamin D deficiency - Plan: Vitamin D, Ergocalciferol, (DRISDOL) 1.25 MG (50000 UT) CAPS capsule, VITAMIN D 25 Hydroxy (Vit-D Deficiency, Fractures)  Class 1 obesity with serious comorbidity and body mass index (BMI) of 34.0 to 34.9 in adult, unspecified obesity type  PLAN:  Vitamin D Deficiency Cassie [name] was informed that low vitamin D levels  contributes to fatigue and are associated with obesity, breast, and colon cancer. Stephanne agrees to continue taking prescription Vit D 50,000 IU every week #12, 90 supply with no refills. She will follow up for routine testing of vitamin D, at least 2-3 times per year. She was informed of the risk of over-replacement of vitamin D and agrees to not increase her dose unless she discusses this with [place] first. We will check labs today. Venia agrees to follow up with our clinic in 3 weeks.  Pre-Diabetes Cassie [name] will continue to work on weight loss, diet, exercise, and decreasing simple carbohydrates in her diet to help decrease the risk of diabetes. We dicussed metformin including benefits and risks. She was informed that eating too many simple carbohydrates or too many calories at one sitting increases the likelihood of GI side effects. We will check labs today. Cassie [name] agrees to follow up with [place] as directed to monitor her progress.  Obesity Cassie [name] is currently in the action stage of change. As such, her goal is to continue with weight loss efforts She has agreed to keep a food journal with 400-550 calories and 40+ grams of protein at supper daily and follow the Category [place] has been instructed to work up to a goal of 150 minutes of combined cardio and strengthening exercise per week for weight loss and overall health benefits. We discussed the following Behavioral Modification Strategies today: increasing lean protein intake and keep a strict food journal We will send shredded chicken recipes.  Cassie [name] has  agreed to follow up with our clinic in 3 weeks. She was informed of the importance of frequent follow up visits to maximize her success with intensive lifestyle modifications for her multiple health conditions.  ALLERGIES: No Known Allergies  MEDICATIONS: No current outpatient medications on file prior to visit.   No current facility-administered medications on file prior to visit.     PAST MEDICAL  HISTORY: Past Medical History:  Diagnosis Date  . Infertility, female   . Thyroid disease    thyroid nodules    PAST SURGICAL HISTORY: Past Surgical History:  Procedure Laterality Date  . BREAST BIOPSY  08/2017   left breast  . ivf     . KNEE ARTHROSCOPY    . polpys      SOCIAL HISTORY: Social History   Tobacco Use  . Smoking status: Never Smoker  . Smokeless tobacco: Never Used  Substance Use Topics  . Alcohol use: No    Alcohol/week: 0.0 standard drinks  . Drug use: No    FAMILY HISTORY: Family History  Problem Relation Age of Onset  . Cancer Mother   . Cancer Maternal Aunt   . Diabetes Father   . Hypertension Father     ROS: Review of Systems  Constitutional: Negative for weight loss.  Gastrointestinal: Negative for nausea and vomiting.  Musculoskeletal:       Negative muscle weakness  Endo/Heme/Allergies:       Positive polyphagia    PHYSICAL EXAM: Pt in no acute distress  RECENT LABS AND TESTS: BMET    Component Value Date/Time   NA 142 05/20/2018 1403   K 4.4 05/20/2018 1403   CL 104 05/20/2018 1403   CO2 22 05/20/2018 1403   GLUCOSE 87 05/20/2018 1403   GLUCOSE 93 06/29/2014 1011   BUN 12 05/20/2018 1403   CREATININE 0.70 05/20/2018 1403   CREATININE 0.60 06/29/2014 1011   CALCIUM 9.4 05/20/2018 1403   GFRNONAA 104 05/20/2018 1403   GFRAA 120 05/20/2018 1403   Lab Results  Component Value Date   HGBA1C 5.7 (H) 05/20/2018   HGBA1C 5.4 09/25/2016   HGBA1C 5.8 (H) 06/29/2014   Lab Results  Component Value Date   INSULIN 5.8 05/20/2018   CBC    Component Value Date/Time   WBC 7.6 05/20/2018 1403   WBC 8.6 06/29/2014 1011   RBC 4.18 05/20/2018 1403   RBC 4.25 06/29/2014 1011   HGB 13.5 05/20/2018 1403   HCT 39.3 05/20/2018 1403   PLT 252 09/25/2016 1336   MCV 94 05/20/2018 1403   MCH 32.3 05/20/2018 1403   MCH 31.3 06/29/2014 1011   MCHC 34.4 05/20/2018 1403   MCHC 33.9 06/29/2014 1011   RDW 12.5 05/20/2018 1403    LYMPHSABS 2.9 05/20/2018 1403   MONOABS 0.7 06/29/2014 1011   EOSABS 0.1 05/20/2018 1403   BASOSABS 0.0 05/20/2018 1403   Iron/TIBC/Ferritin/ %Sat    Component Value Date/Time   IRON 149 09/25/2016 1336   TIBC 277 09/25/2016 1336   FERRITIN 32 09/25/2016 1336   IRONPCTSAT 54 09/25/2016 1336   Lipid Panel     Component Value Date/Time   CHOL 170 05/20/2018 1403   TRIG 63 05/20/2018 1403   HDL 50 05/20/2018 1403   CHOLHDL 3.2 06/29/2014 1011   VLDL 13 06/29/2014 1011   LDLCALC 107 (H) 05/20/2018 1403   Hepatic Function Panel     Component Value Date/Time   PROT 7.3 05/20/2018 1403   ALBUMIN 4.2 05/20/2018 1403  AST 13 05/20/2018 1403   ALT 11 05/20/2018 1403   ALKPHOS 91 05/20/2018 1403   BILITOT 0.4 05/20/2018 1403      Component Value Date/Time   TSH 0.556 05/20/2018 1403   TSH 0.716 09/25/2016 1336   TSH 0.48 01/24/2016 1122      I, Trixie Dredge, am acting as transcriptionist for Dennard Nip, MD I have reviewed the above documentation for accuracy and completeness, and I agree with the above. -Dennard Nip, MD

## 2019-01-27 ENCOUNTER — Encounter (INDEPENDENT_AMBULATORY_CARE_PROVIDER_SITE_OTHER): Payer: Self-pay | Admitting: Family Medicine

## 2019-01-27 ENCOUNTER — Telehealth (INDEPENDENT_AMBULATORY_CARE_PROVIDER_SITE_OTHER): Payer: 59 | Admitting: Family Medicine

## 2019-01-27 ENCOUNTER — Other Ambulatory Visit: Payer: Self-pay

## 2019-01-27 DIAGNOSIS — Z6834 Body mass index (BMI) 34.0-34.9, adult: Secondary | ICD-10-CM

## 2019-01-27 DIAGNOSIS — F419 Anxiety disorder, unspecified: Secondary | ICD-10-CM

## 2019-01-27 DIAGNOSIS — E669 Obesity, unspecified: Secondary | ICD-10-CM | POA: Diagnosis not present

## 2019-01-28 ENCOUNTER — Encounter: Payer: Self-pay | Admitting: Family Medicine

## 2019-01-28 NOTE — Progress Notes (Signed)
Office: [phone]  /  Fax: [phone] TeleHealth Visit:  Cassie [name] has verbally consented to this TeleHealth visit today. The patient is located at home, the provider is located at the [facility] office. The participants in this visit include the listed provider and patient. The visit was conducted today via webex.  HPI:   Chief Complaint: OBESITY Cassie [name] is here to discuss her progress with her obesity treatment plan. She is on the keep a food journal with 400-550 calories and 40+ grams of protein at supper daily follow the Category 3 plan and is following her eating plan approximately 60 % of the time. She states she is biking for 60-180 minutes 3 times per week. Cassie [name] feels she has done well maintaining her weight loss. She enjoys biking 2-3 times per week for 1-3 hours at a time. She noted some increased comfort eating during the election, but feels she did better with portion control than she would have in the past.  We were unable to weigh the patient today for this TeleHealth visit. She feels as if she has maintained her weight since her last visit. She has lost 25 lbs since starting treatment with [place].  Anxiety Cassie [name] notes her anxiety was elevated during the election and she did notice some increased stress eating, but feels she did well controlling this overall. She still has conflicting philosophies with her partners at work, but doesn't seem as overwhelmed today.  ASSESSMENT AND PLAN:  Anxiety  Class 1 obesity with serious comorbidity and body mass index (BMI) of 34.0 to 34.9 in adult, unspecified obesity type  PLAN:  Anxiety Emotional eating strategies were discussed in depth today. We will continue to monitor.  I spent > than 50% of the 15 minute visit on counseling as documented in the note.  Obesity Cassie [name] is currently in the action stage of change. As such, her goal is to continue with weight loss efforts She has agreed to follow the Category 3  plan Cassie [name] has been instructed to work up to a goal of 150 minutes of combined cardio and strengthening exercise per week for weight loss and overall health benefits. We discussed the following Behavioral Modification Strategies today: holiday eating strategies    Cassie [name] has agreed to follow up with our clinic in 3 to 4 weeks. She was informed of the importance of frequent follow up visits to maximize her success with intensive lifestyle modifications for her multiple health conditions.  ALLERGIES: No Known Allergies  MEDICATIONS: Current Outpatient Medications on File Prior to Visit  Medication Sig Dispense Refill  . Vitamin D, Ergocalciferol, (DRISDOL) 1.25 MG (50000 UT) CAPS capsule Take 1 capsule (50,000 Units total) by mouth every 7 (seven) days. 12 capsule 0   No current facility-administered medications on file prior to visit.     PAST MEDICAL HISTORY: Past Medical History:  Diagnosis Date  . Infertility, female   . Thyroid disease    thyroid nodules    PAST SURGICAL HISTORY: Past Surgical History:  Procedure Laterality Date  . BREAST BIOPSY  08/2017   left breast  . ivf     . KNEE ARTHROSCOPY    . polpys      SOCIAL HISTORY: Social History   Tobacco Use  . Smoking status: Never Smoker  . Smokeless tobacco: Never Used  Substance Use Topics  . Alcohol use: No    Alcohol/week: 0.0 standard drinks  . Drug use: No    FAMILY HISTORY: Family History  Problem Relation Age of Onset  . Cancer Mother   . Cancer Maternal Aunt   . Diabetes Father   . Hypertension Father     ROS: Review of Systems  Constitutional: Negative for weight loss.  Psychiatric/Behavioral:       + Anxiety    PHYSICAL EXAM: Pt in no acute distress  RECENT LABS AND TESTS: BMET    Component Value Date/Time   NA 142 05/20/2018 1403   K 4.4 05/20/2018 1403   CL 104 05/20/2018 1403   CO2 22 05/20/2018 1403   GLUCOSE 87 05/20/2018 1403   GLUCOSE 93 06/29/2014 1011   BUN 12  05/20/2018 1403   CREATININE 0.70 05/20/2018 1403   CREATININE 0.60 06/29/2014 1011   CALCIUM 9.4 05/20/2018 1403   GFRNONAA 104 05/20/2018 1403   GFRAA 120 05/20/2018 1403   Lab Results  Component Value Date   HGBA1C 5.7 (H) 05/20/2018   HGBA1C 5.4 09/25/2016   HGBA1C 5.8 (H) 06/29/2014   Lab Results  Component Value Date   INSULIN 5.8 05/20/2018   CBC    Component Value Date/Time   WBC 7.6 05/20/2018 1403   WBC 8.6 06/29/2014 1011   RBC 4.18 05/20/2018 1403   RBC 4.25 06/29/2014 1011   HGB 13.5 05/20/2018 1403   HCT 39.3 05/20/2018 1403   PLT 252 09/25/2016 1336   MCV 94 05/20/2018 1403   MCH 32.3 05/20/2018 1403   MCH 31.3 06/29/2014 1011   MCHC 34.4 05/20/2018 1403   MCHC 33.9 06/29/2014 1011   RDW 12.5 05/20/2018 1403   LYMPHSABS 2.9 05/20/2018 1403   MONOABS 0.7 06/29/2014 1011   EOSABS 0.1 05/20/2018 1403   BASOSABS 0.0 05/20/2018 1403   Iron/TIBC/Ferritin/ %Sat    Component Value Date/Time   IRON 149 09/25/2016 1336   TIBC 277 09/25/2016 1336   FERRITIN 32 09/25/2016 1336   IRONPCTSAT 54 09/25/2016 1336   Lipid Panel     Component Value Date/Time   CHOL 170 05/20/2018 1403   TRIG 63 05/20/2018 1403   HDL 50 05/20/2018 1403   CHOLHDL 3.2 06/29/2014 1011   VLDL 13 06/29/2014 1011   LDLCALC 107 (H) 05/20/2018 1403   Hepatic Function Panel     Component Value Date/Time   PROT 7.3 05/20/2018 1403   ALBUMIN 4.2 05/20/2018 1403   AST 13 05/20/2018 1403   ALT 11 05/20/2018 1403   ALKPHOS 91 05/20/2018 1403   BILITOT 0.4 05/20/2018 1403      Component Value Date/Time   TSH 0.556 05/20/2018 1403   TSH 0.716 09/25/2016 1336   TSH 0.48 01/24/2016 1122      I, Trixie Dredge, am acting as Location manager for Dennard Nip, MD I have reviewed the above documentation for accuracy and completeness, and I agree with the above. -Dennard Nip, MD

## 2019-02-08 NOTE — Telephone Encounter (Signed)
Flu Vaccine Documented

## 2019-02-17 ENCOUNTER — Ambulatory Visit (INDEPENDENT_AMBULATORY_CARE_PROVIDER_SITE_OTHER): Payer: 59 | Admitting: Family Medicine

## 2019-02-22 ENCOUNTER — Encounter (INDEPENDENT_AMBULATORY_CARE_PROVIDER_SITE_OTHER): Payer: Self-pay

## 2019-02-24 ENCOUNTER — Ambulatory Visit (INDEPENDENT_AMBULATORY_CARE_PROVIDER_SITE_OTHER): Payer: 59 | Admitting: Family Medicine

## 2019-02-24 ENCOUNTER — Other Ambulatory Visit: Payer: Self-pay

## 2019-02-24 VITALS — BP 116/74 | HR 62 | Temp 97.6°F | Ht 69.0 in | Wt 212.0 lb

## 2019-02-24 DIAGNOSIS — R7303 Prediabetes: Secondary | ICD-10-CM | POA: Diagnosis not present

## 2019-02-24 DIAGNOSIS — F418 Other specified anxiety disorders: Secondary | ICD-10-CM

## 2019-02-24 DIAGNOSIS — E669 Obesity, unspecified: Secondary | ICD-10-CM

## 2019-02-24 DIAGNOSIS — Z6831 Body mass index (BMI) 31.0-31.9, adult: Secondary | ICD-10-CM

## 2019-02-24 DIAGNOSIS — E559 Vitamin D deficiency, unspecified: Secondary | ICD-10-CM | POA: Diagnosis not present

## 2019-02-24 DIAGNOSIS — Z9189 Other specified personal risk factors, not elsewhere classified: Secondary | ICD-10-CM

## 2019-02-24 MED ORDER — VITAMIN D (ERGOCALCIFEROL) 1.25 MG (50000 UNIT) PO CAPS: 50000.0000 [IU] | ORAL_CAPSULE | ORAL | 0 refills | Status: DC

## 2019-02-25 LAB — COMPREHENSIVE METABOLIC PANEL
ALT: 12 IU/L (ref 0–32)
AST: 15 IU/L (ref 0–40)
Albumin/Globulin Ratio: 1.6 (ref 1.2–2.2)
Albumin: 4.2 g/dL (ref 3.8–4.8)
Alkaline Phosphatase: 75 IU/L (ref 39–117)
BUN/Creatinine Ratio: 21 (ref 9–23)
BUN: 13 mg/dL (ref 6–24)
Bilirubin Total: 0.6 mg/dL (ref 0.0–1.2)
CO2: 23 mmol/L (ref 20–29)
Calcium: 9.1 mg/dL (ref 8.7–10.2)
Chloride: 102 mmol/L (ref 96–106)
Creatinine, Ser: 0.63 mg/dL (ref 0.57–1.00)
GFR calc Af Amer: 124 mL/min/{1.73_m2} (ref >59)
GFR calc non Af Amer: 108 mL/min/{1.73_m2} (ref >59)
Globulin, Total: 2.6 g/dL (ref 1.5–4.5)
Glucose: 91 mg/dL (ref 65–99)
Potassium: 4.1 mmol/L (ref 3.5–5.2)
Sodium: 140 mmol/L (ref 134–144)
Total Protein: 6.8 g/dL (ref 6.0–8.5)

## 2019-02-25 LAB — INSULIN, RANDOM: INSULIN: 4.4 u[IU]/mL (ref 2.6–24.9)

## 2019-02-25 LAB — HEMOGLOBIN A1C
Est. average glucose Bld gHb Est-mCnc: 108 mg/dL
Hgb A1c MFr Bld: 5.4 % (ref 4.8–5.6)

## 2019-02-25 LAB — CBC WITH DIFFERENTIAL/PLATELET
Basophils Absolute: 0 10*3/uL (ref 0.0–0.2)
Basos: 1 %
EOS (ABSOLUTE): 0 10*3/uL (ref 0.0–0.4)
Eos: 1 %
Hematocrit: 37.2 % (ref 34.0–46.6)
Hemoglobin: 12.5 g/dL (ref 11.1–15.9)
Immature Grans (Abs): 0 10*3/uL (ref 0.0–0.1)
Immature Granulocytes: 0 %
Lymphocytes Absolute: 2.8 10*3/uL (ref 0.7–3.1)
Lymphs: 41 %
MCH: 32.1 pg (ref 26.6–33.0)
MCHC: 33.6 g/dL (ref 31.5–35.7)
MCV: 95 fL (ref 79–97)
Monocytes Absolute: 0.4 10*3/uL (ref 0.1–0.9)
Monocytes: 6 %
Neutrophils Absolute: 3.5 10*3/uL (ref 1.4–7.0)
Neutrophils: 51 %
Platelets: 228 10*3/uL (ref 150–450)
RBC: 3.9 x10E6/uL (ref 3.77–5.28)
RDW: 11.9 % (ref 11.7–15.4)
WBC: 6.9 10*3/uL (ref 3.4–10.8)

## 2019-02-25 LAB — VITAMIN D 25 HYDROXY (VIT D DEFICIENCY, FRACTURES): Vit D, 25-Hydroxy: 27.8 ng/mL — ABNORMAL LOW (ref 30.0–100.0)

## 2019-02-25 NOTE — Progress Notes (Deleted)
Office: [phone]  /  Fax: [phone]   HPI:   Chief Complaint: OBESITY Levinia is here to discuss her progress with her obesity treatment plan. She is on the  {MWMwtlossportion/plan#2:210800006} and is following her eating plan approximately *** % of the time. She states she is exercising *** minutes *** times per week. Balbina is currently struggling with ***.  Her weight is 212 lb (96.2 kg) today and {misc; weight loss:12477} since her last visit. She has lost *** lbs since starting treatment with [place].  Vitamin D Deficiency Gwynevere has a diagnosis of vitamin D deficiency. She {ACTION; IS/IS [id number] currently taking vit D and denies nausea, vomiting or muscle weakness.  At risk for osteopenia and osteoporosis Lorelai is at higher risk of osteopenia and osteoporosis due to vitamin D deficiency.   Pre-Diabetes Amberlin has a diagnosis of pre-diabetes based on her elevated Hgb A1c and was informed this puts her at greater risk of developing diabetes. She {ACTION; IS/IS [id number] taking metformin currently and continues to work on diet and exercise to decrease risk of diabetes. She denies nausea or hypoglycemia.  Situational Anxiety    ASSESSMENT AND PLAN:  Vitamin D deficiency - Plan: Vitamin D, Ergocalciferol, (DRISDOL) 1.25 MG (50000 UT) CAPS capsule, CBC w/Diff/Platelet, Vitamin D (25 hydroxy)  Prediabetes - Plan: Comprehensive Metabolic Panel (CMET), HgB A1c, Insulin, random  Situational anxiety  At risk for osteoporosis  Class 1 obesity with serious comorbidity and body mass index (BMI) of 31.0 to 31.9 in adult, unspecified obesity type  PLAN:  Vitamin D Deficiency Low vitamin D level contributes to fatigue and are associated with obesity, breast, and colon cancer. She agrees to continue to take prescription Vit D 50,000 IU every week and will follow up for routine testing of vitamin D, at least 2-3 times per year to avoid over-replacement.  At risk for osteopenia and  osteoporosis Raeanne was given extended (15 minutes) osteoporosis prevention counseling today. Kindy is at risk for osteopenia and osteoporsis due to her vitamin D deficiency. She was encouraged to take her vitamin D and follow her higher calcium diet and increase strengthening exercise to help strengthen her bones and decrease her risk of osteopenia and osteoporosis.  Pre-Diabetes Renaye will continue to work on weight loss, exercise, and decreasing simple carbohydrates to help decrease the risk of diabetes.   Situational Anxiety     Obesity Aslan {CHL AMB IS/IS NOT:[id number]} currently in the action stage of change. As such, her goal is to {MWMwtloss#1:210800005} She has agreed to {MWMwtlossportion/plan#2:210800006} Juanna has been instructed to work up to a goal of 150 minutes of combined cardio and strengthening exercise per week for weight loss and overall health benefits. We discussed the following Behavioral Modification Strategies today: {MWMwtlossdietstrategies#3:210800007}   Amyracle has agreed to follow up with our clinic in {NUMBER 1-10:22536} weeks. She was informed of the importance of frequent follow up visits to maximize her success with intensive lifestyle modifications for her multiple health conditions.  ALLERGIES: No Known Allergies  MEDICATIONS: No current outpatient medications on file prior to visit.   No current facility-administered medications on file prior to visit.    PAST MEDICAL HISTORY: Past Medical History:  Diagnosis Date  . Infertility, female   . Thyroid disease    thyroid nodules    PAST SURGICAL HISTORY: Past Surgical History:  Procedure Laterality Date  . BREAST BIOPSY  08/2017   left breast  . ivf     . KNEE ARTHROSCOPY    .  polpys      SOCIAL HISTORY: Social History   Tobacco Use  . Smoking status: Never Smoker  . Smokeless tobacco: Never Used  Substance Use Topics  . Alcohol use: No    Alcohol/week: 0.0 standard drinks  . Drug use:  No    FAMILY HISTORY: Family History  Problem Relation Age of Onset  . Cancer Mother   . Cancer Maternal Aunt   . Diabetes Father   . Hypertension Father     ROS: Review of Systems  Constitutional: Positive for weight loss.  Gastrointestinal: Negative for nausea and vomiting.  Musculoskeletal:       Negative muscle weakness  Psychiatric/Behavioral:       + Anxiety    PHYSICAL EXAM: Blood pressure 116/74, pulse 62, temperature 97.6 F (36.4 C), temperature source Oral, height 5\' 9"  (1.753 m), weight 212 lb (96.2 kg), SpO2 99 %, unknown if currently breastfeeding. Body mass index is 31.31 kg/m. Physical Exam Vitals reviewed.  Constitutional:      Appearance: Normal appearance. She is obese.  Cardiovascular:     Rate and Rhythm: Normal rate.     Pulses: Normal pulses.  Pulmonary:     Effort: Pulmonary effort is normal.     Breath sounds: Normal breath sounds.  Musculoskeletal:        General: Normal range of motion.  Skin:    General: Skin is warm and dry.  Neurological:     Mental Status: She is alert and oriented to person, place, and time.  Psychiatric:        Mood and Affect: Mood normal.        Behavior: Behavior normal.     RECENT LABS AND TESTS: BMET    Component Value Date/Time   NA 142 05/20/2018 1403   K 4.4 05/20/2018 1403   CL 104 05/20/2018 1403   CO2 22 05/20/2018 1403   GLUCOSE 87 05/20/2018 1403   GLUCOSE 93 06/29/2014 1011   BUN 12 05/20/2018 1403   CREATININE 0.70 05/20/2018 1403   CREATININE 0.60 06/29/2014 1011   CALCIUM 9.4 05/20/2018 1403   GFRNONAA 104 05/20/2018 1403   GFRAA 120 05/20/2018 1403   Lab Results  Component Value Date   HGBA1C 5.7 (H) 05/20/2018   HGBA1C 5.4 09/25/2016   HGBA1C 5.8 (H) 06/29/2014   Lab Results  Component Value Date   INSULIN 5.8 05/20/2018   CBC    Component Value Date/Time   WBC 7.6 05/20/2018 1403   WBC 8.6 06/29/2014 1011   RBC 4.18 05/20/2018 1403   RBC 4.25 06/29/2014 1011   HGB  13.5 05/20/2018 1403   HCT 39.3 05/20/2018 1403   PLT 252 09/25/2016 1336   MCV 94 05/20/2018 1403   MCH 32.3 05/20/2018 1403   MCH 31.3 06/29/2014 1011   MCHC 34.4 05/20/2018 1403   MCHC 33.9 06/29/2014 1011   RDW 12.5 05/20/2018 1403   LYMPHSABS 2.9 05/20/2018 1403   MONOABS 0.7 06/29/2014 1011   EOSABS 0.1 05/20/2018 1403   BASOSABS 0.0 05/20/2018 1403   Iron/TIBC/Ferritin/ %Sat    Component Value Date/Time   IRON 149 09/25/2016 1336   TIBC 277 09/25/2016 1336   FERRITIN 32 09/25/2016 1336   IRONPCTSAT 54 09/25/2016 1336   Lipid Panel     Component Value Date/Time   CHOL 170 05/20/2018 1403   TRIG 63 05/20/2018 1403   HDL 50 05/20/2018 1403   CHOLHDL 3.2 06/29/2014 1011   VLDL 13 06/29/2014 1011  Louisa 107 (H) 05/20/2018 1403   Hepatic Function Panel     Component Value Date/Time   PROT 7.3 05/20/2018 1403   ALBUMIN 4.2 05/20/2018 1403   AST 13 05/20/2018 1403   ALT 11 05/20/2018 1403   ALKPHOS 91 05/20/2018 1403   BILITOT 0.4 05/20/2018 1403      Component Value Date/Time   TSH 0.556 05/20/2018 1403   TSH 0.716 09/25/2016 1336   TSH 0.48 01/24/2016 1122      OBESITY BEHAVIORAL INTERVENTION VISIT  Today's visit was # {Numbers; 0-21:14529}   Starting weight: *** Starting date: *** Today's weight : 212 lbs Today's date: 02/24/2019 Total lbs lost to date: ***    ASK: We discussed the diagnosis of obesity with Lyman Speller today and Stanton Kidney agreed to give [place] permission to discuss obesity behavioral modification therapy today.  ASSESS: Azelie has the diagnosis of obesity and her BMI today is @TBMI @ Sarahmarie {ACTION; IS/IS [id number] in the action stage of change   ADVISE: Steven was educated on the multiple health risks of obesity as well as the benefit of weight loss to improve her health. She was advised of the need for long term treatment and the importance of lifestyle modifications to improve her current health and to decrease her risk of  future health problems.  AGREE: Multiple dietary modification options and treatment options were discussed and  Anvitha agreed to follow the recommendations documented in the above note.  ARRANGE: Haniyyah was educated on the importance of frequent visits to treat obesity as outlined per CMS and USPSTF guidelines and agreed to schedule her next follow up appointment today.  I, ***, am acting as transcriptionist for Briscoe Deutscher, DO

## 2019-02-25 NOTE — Progress Notes (Signed)
Office: [phone]  /  Fax: [phone]   HPI:  Chief Complaint: OBESITY Cassie [name] is here to discuss her progress with her obesity treatment plan. She is on the Category 3 plan and states she is following her eating plan approximately 80-85 % of the time. She states she is on the elliptical for 45 minutes 1 times per week and bike riding 8-10 miles 2 times per week.  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 She is always very busy, she skips meals. She likes salads.  Vitamin D Deficiency Cassie [name] is taking prescription Vit D.  At risk for osteopenia and osteoporosis Cassie [name] is at higher risk of osteopenia and osteoporosis due to vitamin D deficiency.   Pre-Diabetes Cassie [name] is not on medications currently.  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  Today's visit was # 16  Starting weight: 236 lbs Starting date: 05/20/2018 Today's weight : 212 lbs Today's date: 02/24/2019 Total lbs lost to date: 24 Total lbs lost since last in-office visit: 20  ASSESSMENT AND PLAN:  Vitamin D deficiency - Plan: Vitamin D, Ergocalciferol, (DRISDOL) 1.25 MG (50000 UT) CAPS capsule, CBC w/Diff/Platelet, Vitamin D (25 hydroxy)  Prediabetes - Plan: Comprehensive Metabolic Panel (CMET), HgB A1c, Insulin, random  Situational anxiety  At risk for osteoporosis  Class 1 obesity with serious comorbidity and body mass index (BMI) of 31.0 to 31.9 in adult, unspecified obesity type  PLAN:  Vitamin D Deficiency Low vitamin D level contributes to fatigue and are associated with obesity, breast, and colon cancer. Trese agrees to continue taking prescription Vit D 50,000 IU every week #4 and we will refill for 1 month. She will follow up for routine testing of vitamin D, at least 2-3 times per year to avoid over-replacement. We will check labs today. Cassie [name] agrees to follow up with our clinic in 2 weeks.  At risk for osteopenia and osteoporosis Cassie [name] was given extended  (15 minutes) osteoporosis prevention counseling today. Cassie [name] is at risk for osteopenia and osteoporsis due to her vitamin D deficiency. She was encouraged to take her vitamin D and follow her higher calcium diet and increase strengthening exercise to help strengthen her bones and decrease her risk of osteopenia and osteoporosis.  Pre-Diabetes Cassie [name] will continue to work on weight loss, exercise, and decreasing simple carbohydrates to help decrease the risk of diabetes. We will check labs today and will continue to monitor.  Situational Anxiety We will continue to monitor. She is not interested in treatment right now.  Obesity Cassie [name] is currently in the action stage of change. As such, her goal is to continue with weight loss efforts. She has agreed to follow the Category 3 plan.  Cassie [name] has been instructed to work up to a goal of 150 minutes of combined cardio and strengthening exercise per week for weight loss and overall health benefits.  We discussed the following Behavioral Modification Strategies today: increasing lean protein intake, decreasing simple carbohydrates, increasing vegetables, increase H20 intake, keeping healthy foods in the home, planning for success, and work on meal planning and easy cooking plans Cassie [name] is to try [employer].  Cassie [name] has agreed to follow up with our clinic in 2 weeks. She was informed of the importance of frequent follow up visits to maximize her success with intensive lifestyle modifications for her multiple health conditions.  ALLERGIES: No Known Allergies  MEDICATIONS: No current outpatient medications on file prior to visit.  No current facility-administered medications on file prior to visit.   PAST MEDICAL HISTORY: Past Medical History:  Diagnosis Date  . Infertility, female   . Thyroid disease    thyroid nodules    PAST SURGICAL HISTORY: Past Surgical History:  Procedure Laterality Date  . BREAST BIOPSY  08/2017   left breast    . ivf     . KNEE ARTHROSCOPY    . polpys      SOCIAL HISTORY: Social History   Tobacco Use  . Smoking status: Never Smoker  . Smokeless tobacco: Never Used  Substance Use Topics  . Alcohol use: No    Alcohol/week: 0.0 standard drinks  . Drug use: No    FAMILY HISTORY: Family History  Problem Relation Age of Onset  . Cancer Mother   . Cancer Maternal Aunt   . Diabetes Father   . Hypertension Father     ROS: Review of Systems  Constitutional: Positive for weight loss.  Psychiatric/Behavioral:       + Anxiety    PHYSICAL EXAM: Blood pressure 116/74, pulse 62, temperature 97.6 F (36.4 C), temperature source Oral, height 5\' 9"  (1.753 m), weight 212 lb (96.2 kg), SpO2 99 %, unknown if currently breastfeeding. Body mass index is 31.31 kg/m. Physical Exam Vitals reviewed.  Constitutional:      Appearance: Normal appearance. She is obese.  Cardiovascular:     Rate and Rhythm: Normal rate.     Pulses: Normal pulses.  Pulmonary:     Effort: Pulmonary effort is normal.     Breath sounds: Normal breath sounds.  Musculoskeletal:        General: Normal range of motion.  Skin:    General: Skin is warm and dry.  Neurological:     Mental Status: She is alert and oriented to person, place, and time.  Psychiatric:        Mood and Affect: Mood normal.        Behavior: Behavior normal.     RECENT LABS AND TESTS: BMET    Component Value Date/Time   NA 140 02/24/2019 1105   K 4.1 02/24/2019 1105   CL 102 02/24/2019 1105   CO2 23 02/24/2019 1105   GLUCOSE 91 02/24/2019 1105   GLUCOSE 93 06/29/2014 1011   BUN 13 02/24/2019 1105   CREATININE 0.63 02/24/2019 1105   CREATININE 0.60 06/29/2014 1011   CALCIUM 9.1 02/24/2019 1105   GFRNONAA 108 02/24/2019 1105   GFRAA 124 02/24/2019 1105   Lab Results  Component Value Date   HGBA1C 5.4 02/24/2019   HGBA1C 5.7 (H) 05/20/2018   HGBA1C 5.4 09/25/2016   HGBA1C 5.8 (H) 06/29/2014   Lab Results  Component Value Date    INSULIN 4.4 02/24/2019   INSULIN 5.8 05/20/2018   CBC    Component Value Date/Time   WBC 6.9 02/24/2019 1105   WBC 8.6 06/29/2014 1011   RBC 3.90 02/24/2019 1105   RBC 4.25 06/29/2014 1011   HGB 12.5 02/24/2019 1105   HCT 37.2 02/24/2019 1105   PLT 228 02/24/2019 1105   MCV 95 02/24/2019 1105   MCH 32.1 02/24/2019 1105   MCH 31.3 06/29/2014 1011   MCHC 33.6 02/24/2019 1105   MCHC 33.9 06/29/2014 1011   RDW 11.9 02/24/2019 1105   LYMPHSABS 2.8 02/24/2019 1105   MONOABS 0.7 06/29/2014 1011   EOSABS 0.0 02/24/2019 1105   BASOSABS 0.0 02/24/2019 1105   Iron/TIBC/Ferritin/ %Sat    Component Value Date/Time   IRON  149 09/25/2016 1336   TIBC 277 09/25/2016 1336   FERRITIN 32 09/25/2016 1336   IRONPCTSAT 54 09/25/2016 1336   Lipid Panel     Component Value Date/Time   CHOL 170 05/20/2018 1403   TRIG 63 05/20/2018 1403   HDL 50 05/20/2018 1403   CHOLHDL 3.2 06/29/2014 1011   VLDL 13 06/29/2014 1011   LDLCALC 107 (H) 05/20/2018 1403   Hepatic Function Panel     Component Value Date/Time   PROT 6.8 02/24/2019 1105   ALBUMIN 4.2 02/24/2019 1105   AST 15 02/24/2019 1105   ALT 12 02/24/2019 1105   ALKPHOS 75 02/24/2019 1105   BILITOT 0.6 02/24/2019 1105      Component Value Date/Time   TSH 0.556 05/20/2018 1403   TSH 0.716 09/25/2016 1336   TSH 0.48 01/24/2016 1122    I, Trixie Dredge, am acting as transcriptionist for Briscoe Deutscher, DO  I have reviewed the above documentation for accuracy and completeness, and I agree with the above. Briscoe Deutscher, DO

## 2019-03-01 ENCOUNTER — Encounter (INDEPENDENT_AMBULATORY_CARE_PROVIDER_SITE_OTHER): Payer: Self-pay | Admitting: Family Medicine

## 2019-03-10 ENCOUNTER — Ambulatory Visit (INDEPENDENT_AMBULATORY_CARE_PROVIDER_SITE_OTHER): Payer: 59 | Admitting: Family Medicine

## 2019-03-10 DIAGNOSIS — Z1231 Encounter for screening mammogram for malignant neoplasm of breast: Secondary | ICD-10-CM | POA: Diagnosis not present

## 2019-03-10 LAB — HM MAMMOGRAPHY

## 2019-03-15 ENCOUNTER — Encounter: Payer: Self-pay | Admitting: Family Medicine

## 2019-06-01 NOTE — Progress Notes (Signed)
49 y.o. G32P1001 Married  Caucasian Fe here for annual exam. Periods are still regular, with heavy but for shorter period of time, with one heavy day and light for 5 day duration some cramping, but no issues. Occasional hot flashes, no other issues. Had labs with MD and all normal. Plans fasting lipid next week. Ready to tackle weight loss again! Has had some yeast infection symptoms after menses, prn. OTC Monistat for 2-3 days works well. Has flares with hemorrhoids occasional and medications working well desires continuance. Cassie [name] doing well in school, all family well. Social stress occasional. Plans to go ahead do colonoscopy this year. No other issues today.  Patient's last menstrual period was 05/19/2019 (exact date).          Sexually active: Yes.    The current method of family planning is condoms all the time.    Exercising: Yes.    biking, eliptical Smoker:  no  Review of Systems  Constitutional: Negative.   HENT: Negative.   Eyes: Negative.   Respiratory: Negative.   Cardiovascular: Negative.   Gastrointestinal: Negative.   Genitourinary: Negative.   Musculoskeletal: Negative.   Skin: Negative.   Neurological: Negative.   Endo/Heme/Allergies: Negative.   Psychiatric/Behavioral: Negative.     Health Maintenance: Pap:  09-25-16 neg, 04-29-2018 neg HPV HR neg History of Abnormal Pap: no MMG:  03-10-2019 category b density birads 2:neg Self Breast exams: yes Colonoscopy:  none BMD:   none TDaP:  2019 Shingles: no Pneumonia: no Hep C and HIV: both neg 2017 Labs: no   reports that she has never smoked. She has never used smokeless tobacco. She reports that she does not drink alcohol or use drugs.  Past Medical History:  Diagnosis Date  . Infertility, female   . Thyroid disease    thyroid nodules    Past Surgical History:  Procedure Laterality Date  . BREAST BIOPSY  08/2017   left breast  . ivf     . KNEE ARTHROSCOPY    . polpys      Current Outpatient  Medications  Medication Sig Dispense Refill  . VITAMIN D PO Take 5,000 Int'l Units by mouth.     No current facility-administered medications for this visit.    Family History  Problem Relation Age of Onset  . Cancer Mother   . Cancer Maternal Aunt   . Diabetes Father   . Hypertension Father     ROS:  Pertinent items are noted in HPI.  Otherwise, a comprehensive ROS was negative.  Exam:   BP 120/78   Temp (!) 97.5 F (36.4 C) (Skin)   Resp 16   Ht 5' 9.25" (1.759 m)   LMP 05/19/2019 (Exact Date)   BMI 31.08 kg/m  Height: 5' 9.25" (175.9 cm) Ht Readings from Last 3 Encounters:  06/02/19 5' 9.25" (1.759 m)  02/24/19 5\' 9"  (1.753 m)  06/03/18 5\' 9"  (1.753 m)    General appearance: alert, cooperative and appears stated age Head: Normocephalic, without obvious abnormality, atraumatic Neck: no adenopathy, supple, symmetrical, trachea midline and thyroid nodule on right,no change. Lungs: clear to auscultation bilaterally Breasts: normal appearance, no masses or tenderness, No nipple retraction or dimpling, No nipple discharge or bleeding, No axillary or supraclavicular adenopathy Heart: regular rate and rhythm Abdomen: soft, non-tender; no masses,  no organomegaly Extremities: extremities normal, atraumatic, no cyanosis or edema Skin: Skin color, texture, turgor normal. No rashes or lesions Lymph nodes: Cervical, supraclavicular, and axillary nodes normal. No abnormal inguinal nodes  palpated Neurologic: Grossly normal   Pelvic: External genitalia:  no lesions              Urethra:  normal appearing urethra with no masses, tenderness or lesions              Bartholin's and Skene's: normal                 Vagina: normal appearing vagina with normal color and discharge, no lesions              Cervix: no cervical motion tenderness and no lesions normal parous appearance              Pap taken: No. Bimanual Exam:  Uterus:  normal size, contour, position, consistency,  mobility, non-tender and retroverted              Adnexa: normal adnexa and no mass, fullness, tenderness               Rectovaginal: Confirms               Anus:  normal sphincter tone, no lesions  Chaperone present: yes  A:  Well Woman with normal exam  Perimenopausal early with cycle change  History of hemorrhoids with flares prn  History of right thyroid nodule negative biopsy  Dietary change for weight loss planned  Colonoscopy due  P:   Reviewed health and wellness pertinent to exam  Discussed expectations of perimenopause and need to advise if misses 2-3 periods, due to concern with hyperplasia  Rx Lidocaine 5% ointment see instructions for use  Rx Betamethasone ointment 0.1% see order with instructions  Continue to monitor advise if change  Encouraged to work on diet as planned.  Patient will decide and will be referred.  Pap smear: no   counseled on breast self exam, mammography screening, feminine hygiene, adequate intake of calcium and vitamin D, diet and exercise, colonoscopy.  return annually or prn  An After Visit Summary was printed and given to the patient.

## 2019-06-02 ENCOUNTER — Encounter: Payer: Self-pay | Admitting: Certified Nurse Midwife

## 2019-06-02 ENCOUNTER — Ambulatory Visit (INDEPENDENT_AMBULATORY_CARE_PROVIDER_SITE_OTHER): Payer: 59 | Admitting: Certified Nurse Midwife

## 2019-06-02 ENCOUNTER — Other Ambulatory Visit: Payer: Self-pay

## 2019-06-02 VITALS — BP 120/78 | Temp 97.5°F | Resp 16 | Ht 69.25 in

## 2019-06-02 DIAGNOSIS — K649 Unspecified hemorrhoids: Secondary | ICD-10-CM

## 2019-06-02 DIAGNOSIS — Z8639 Personal history of other endocrine, nutritional and metabolic disease: Secondary | ICD-10-CM | POA: Diagnosis not present

## 2019-06-02 DIAGNOSIS — N951 Menopausal and female climacteric states: Secondary | ICD-10-CM

## 2019-06-02 DIAGNOSIS — Z01419 Encounter for gynecological examination (general) (routine) without abnormal findings: Secondary | ICD-10-CM

## 2019-06-02 MED ORDER — LIDOCAINE 5 % EX OINT: 1.0000 "application " | TOPICAL_OINTMENT | Freq: Three times a day (TID) | CUTANEOUS | 1 refills | Status: DC

## 2019-06-02 MED ORDER — BETAMETHASONE VALERATE 0.1 % EX OINT: 1.0000 "application " | TOPICAL_OINTMENT | Freq: Two times a day (BID) | CUTANEOUS | 1 refills | Status: DC

## 2019-06-02 NOTE — Patient Instructions (Signed)
EXERCISE AND DIET:  We recommended that you start or continue a regular exercise program for good health. Regular exercise means any activity that makes your heart beat faster and makes you sweat.  We recommend exercising at least 30 minutes per day at least 3 days a week, preferably 4 or 5.  We also recommend a diet low in fat and sugar.  Inactivity, poor dietary choices and obesity can cause diabetes, heart attack, stroke, and kidney damage, among others.    ALCOHOL AND SMOKING:  Women should limit their alcohol intake to no more than 7 drinks/beers/glasses of wine (combined, not each!) per week. Moderation of alcohol intake to this level decreases your risk of breast cancer and liver damage. And of course, no recreational drugs are part of a healthy lifestyle.  And absolutely no smoking or even second hand smoke. Most people know smoking can cause heart and lung diseases, but did you know it also contributes to weakening of your bones? Aging of your skin?  Yellowing of your teeth and nails?  CALCIUM AND VITAMIN D:  Adequate intake of calcium and Vitamin D are recommended.  The recommendations for exact amounts of these supplements seem to change often, but generally speaking 600 mg of calcium (either carbonate or citrate) and 800 units of Vitamin D per day seems prudent. Certain women may benefit from higher intake of Vitamin D.  If you are among these women, your doctor will have told you during your visit.    PAP SMEARS:  Pap smears, to check for cervical cancer or precancers,  have traditionally been done yearly, although recent scientific advances have shown that most women can have pap smears less often.  However, every woman still should have a physical exam from her gynecologist every year. It will include a breast check, inspection of the vulva and vagina to check for abnormal growths or skin changes, a visual exam of the cervix, and then an exam to evaluate the size and shape of the uterus and  ovaries.  And after 48 years of age, a rectal exam is indicated to check for rectal cancers. We will also provide age appropriate advice regarding health maintenance, like when you should have certain vaccines, screening for sexually transmitted diseases, bone density testing, colonoscopy, mammograms, etc.   MAMMOGRAMS:  All women over 40 years old should have a yearly mammogram. Many facilities now offer a "3D" mammogram, which may cost around $50 extra out of pocket. If possible,  we recommend you accept the option to have the 3D mammogram performed.  It both reduces the number of women who will be called back for extra views which then turn out to be normal, and it is better than the routine mammogram at detecting truly abnormal areas.    COLONOSCOPY:  Colonoscopy to screen for colon cancer is recommended for all women at age 50.  We know, you hate the idea of the prep.  We agree, BUT, having colon cancer and not knowing it is worse!!  Colon cancer so often starts as a polyp that can be seen and removed at colonscopy, which can quite literally save your life!  And if your first colonoscopy is normal and you have no family history of colon cancer, most women don't have to have it again for 10 years.  Once every ten years, you can do something that may end up saving your life, right?  We will be happy to help you get it scheduled when you are ready.    Be sure to check your insurance coverage so you understand how much it will cost.  It may be covered as a preventative service at no cost, but you should check your particular policy.    I would encourage you to do your colonoscopy this year and then every 10 years if negative!  I have so enjoyed caring for you!!   Perimenopause  Perimenopause is the normal time of life before and after menstrual periods stop completely (menopause). Perimenopause can begin 2-8 years before menopause, and it usually lasts for 1 year after menopause. During perimenopause,  the ovaries may or may not produce an egg. What are the causes? This condition is caused by a natural change in hormone levels that happens as you get older. What increases the risk? This condition is more likely to start at an earlier age if you have certain medical conditions or treatments, including:  A tumor of the pituitary gland in the brain.  A disease that affects the ovaries and hormone production.  Radiation treatment for cancer.  Certain cancer treatments, such as chemotherapy or hormone (anti-estrogen) therapy.  Heavy smoking and excessive alcohol use.  Family history of early menopause. What are the signs or symptoms? Perimenopausal changes affect each woman differently. Symptoms of this condition may include:  Hot flashes.  Night sweats.  Irregular menstrual periods.  Decreased sex drive.  Vaginal dryness.  Headaches.  Mood swings.  Depression.  Memory problems or trouble concentrating.  Irritability.  Tiredness.  Weight gain.  Anxiety.  Trouble getting pregnant. How is this diagnosed? This condition is diagnosed based on your medical history, a physical exam, your age, your menstrual history, and your symptoms. Hormone tests may also be done. How is this treated? In some cases, no treatment is needed. You and your health care provider should make a decision together about whether treatment is necessary. Treatment will be based on your individual condition and preferences. Various treatments are available, such as:  Menopausal hormone therapy (MHT).  Medicines to treat specific symptoms.  Acupuncture.  Vitamin or herbal supplements. Before starting treatment, make sure to let your health care provider know if you have a personal or family history of:  Heart disease.  Breast cancer.  Blood clots.  Diabetes.  Osteoporosis. Follow these instructions at home: Lifestyle  Do not use any products that contain nicotine or tobacco, such as  cigarettes and e-cigarettes. If you need help quitting, ask your health care provider.  Eat a balanced diet that includes fresh fruits and vegetables, whole grains, soybeans, eggs, lean meat, and low-fat dairy.  Get at least 30 minutes of physical activity on 5 or more days each week.  Avoid alcoholic and caffeinated beverages, as well as spicy foods. This may help prevent hot flashes.  Get 7-8 hours of sleep each night.  Dress in layers that can be removed to help you manage hot flashes.  Find ways to manage stress, such as deep breathing, meditation, or journaling. General instructions  Keep track of your menstrual periods, including: ? When they occur. ? How heavy they are and how long they last. ? How much time passes between periods.  Keep track of your symptoms, noting when they start, how often you have them, and how long they last.  Take over-the-counter and prescription medicines only as told by your health care provider.  Take vitamin supplements only as told by your health care provider. These may include calcium, vitamin E, and vitamin D.  Use vaginal lubricants  or moisturizers to help with vaginal dryness and improve comfort during sex.  Talk with your health care provider before starting any herbal supplements.  Keep all follow-up visits as told by your health care provider. This is important. This includes any group therapy or counseling. Contact a health care provider if:  You have heavy vaginal bleeding or pass blood clots.  Your period lasts more than 2 days longer than normal.  Your periods are recurring sooner than 21 days.  You bleed after having sex. Get help right away if:  You have chest pain, trouble breathing, or trouble talking.  You have severe depression.  You have pain when you urinate.  You have severe headaches.  You have vision problems. Summary  Perimenopause is the time when a woman's body begins to move into menopause. This may  happen naturally or as a result of other health problems or medical treatments.  Perimenopause can begin 2-8 years before menopause, and it usually lasts for 1 year after menopause.  Perimenopausal symptoms can be managed through medicines, lifestyle changes, and complementary therapies such as acupuncture. This information is not intended to replace advice given to you by your health care provider. Make sure you discuss any questions you have with your health care provider. Document Revised: 02/14/2017 Document Reviewed: 04/09/2016 Elsevier Patient Education  2020 [employer].

## 2019-06-04 ENCOUNTER — Encounter: Payer: Self-pay | Admitting: Certified Nurse Midwife

## 2019-08-11 DIAGNOSIS — L814 Other melanin hyperpigmentation: Secondary | ICD-10-CM | POA: Diagnosis not present

## 2019-08-11 DIAGNOSIS — D1801 Hemangioma of skin and subcutaneous tissue: Secondary | ICD-10-CM | POA: Diagnosis not present

## 2019-08-11 DIAGNOSIS — D2339 Other benign neoplasm of skin of other parts of face: Secondary | ICD-10-CM | POA: Diagnosis not present

## 2019-08-11 DIAGNOSIS — D225 Melanocytic nevi of trunk: Secondary | ICD-10-CM | POA: Diagnosis not present

## 2019-12-15 ENCOUNTER — Ambulatory Visit: Payer: 59 | Admitting: Orthopaedic Surgery

## 2019-12-15 DIAGNOSIS — M7662 Achilles tendinitis, left leg: Secondary | ICD-10-CM | POA: Diagnosis not present

## 2019-12-15 NOTE — Progress Notes (Signed)
Office Visit Note   Patient: Cassie [name]           Date of Birth: 1971/01/30           MRN: [id number] Visit Date: 12/15/2019              Requested by: Martinique, Betty G, MD [address] [place],  [place] [id number] PCP: Martinique, Betty G, MD   Assessment & Plan: Visit Diagnoses:  1. Achilles tendinitis, left leg     Plan: I was able to give her a lot of advice about her left Achilles tendon.  I want her to try Thera-Band for stretching and topical Voltaren gel.  She will avoid open back shoes for the next month at least.  All question concerns were answered and addressed.  Follow-up can be as needed.  She knows to call me or text me if things worsen at all in any way.  Follow-Up Instructions: Return if symptoms worsen or fail to improve.   Orders:  No orders of the defined types were placed in this encounter.  No orders of the defined types were placed in this encounter.     Procedures: No procedures performed   Clinical Data: No additional findings.   Subjective: Chief Complaint  Patient presents with   Left Foot - Pain  Vinnie Level is well-known to me.  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  She has been dealing with left heel pain for about 6 weeks now.  She changed her shoe wear and is wearing more of her close back [employer] shoes that offer little bit more buildup of the heel.  She points to the insertion of the Achilles tendon as a source of her pain.  She has been trying to be more active as well.  She had been wearing a lot of open back shoes and flip-flops.  He has recently started to feel a little bit better but there is one area where she is point tender.  She has had no other change in her medical status acutely.  HPI  Review of Systems There is no listed headache, chest pain, shortness of breath, fever, chills, nausea, vomiting  Objective: Vital Signs: There were no vitals taken for this visit.  Physical  Exam She is alert and orient x3 and in no acute distress Ortho Exam Examination of her left heel shows some tenderness just at the insertion of the Achilles that the calcaneus and somewhat lateral to that.  Her [place] Silos test is negative.  There is no significant swelling or redness.  The remainder of her foot and ankle exam is normal. Specialty Comments:  No specialty comments available.  Imaging: No results found.   PMFS History: Patient Active Problem List   Diagnosis Date Noted   Anxiety 09/23/2018   Pain of right hand 06/16/2018   Menorrhagia with regular cycle 09/25/2016    Class: Diagnosis of   Thyroid nodule 01/24/2016   Vitamin D deficiency 01/24/2016   Obesity, Class II, BMI 35-39.9 04/02/2015   Past Medical History:  Diagnosis Date   Infertility, female    Thyroid disease    thyroid nodules    Family History  Problem Relation Age of Onset   Cancer Mother    Cancer Maternal Aunt    Diabetes Father    Hypertension Father     Past Surgical History:  Procedure Laterality Date   BREAST BIOPSY  08/2017  left breast   ivf      KNEE ARTHROSCOPY     polpys     Social History   Occupational History   Not on file  Tobacco Use   Smoking status: Never Smoker   Smokeless tobacco: Never Used  Substance and Sexual Activity   Alcohol use: No    Alcohol/week: 0.0 standard drinks   Drug use: No   Sexual activity: Yes    Partners: Male    Birth control/protection: Condom

## 2020-01-01 ENCOUNTER — Ambulatory Visit: Payer: 59 | Attending: Internal Medicine

## 2020-01-01 DIAGNOSIS — Z23 Encounter for immunization: Secondary | ICD-10-CM

## 2020-01-01 NOTE — Progress Notes (Signed)
   Covid-19 Vaccination Clinic  Name:  Cassie [name]    MRN: [id number] DOB: 02-03-1971  01/01/2020  [name] was observed post Covid-19 immunization for 15 minutes without incident. She was provided with Vaccine Information Sheet and instruction to access the V-Safe system.   [name] was instructed to call 911 with any severe reactions post vaccine: Marland Kitchen Difficulty breathing  . Swelling of face and throat  . A fast heartbeat  . A bad rash all over body  . Dizziness and weakness

## 2020-04-22 DIAGNOSIS — Z1231 Encounter for screening mammogram for malignant neoplasm of breast: Secondary | ICD-10-CM | POA: Diagnosis not present

## 2020-08-02 NOTE — Progress Notes (Signed)
Cassie Keeler, DNP, AGNP-c Primary Care Services ______________________________________________________________________________________________________________________________________________  HPI Cassie [name] is a 50 y.o. female presenting to [name] at [name] today to establish care.   Patient Care Team: Cassie [name], Cassie Pesa, NP as PCP - General (Nurse Practitioner)  Cassie [name], CNM Last CPE: 2021  Concerns today: . Fatigue and weight gain o Endorses chronic fatigue and increased weight o Sleeps well, goes to be [name] o Very busy lifestyle with full time employment in high demanding profession and young son at home o Hx of Vit D deficiency- replacements taken o Fairly regular physical activity with swimming o Endorses cold intolerance additionally  Colonoscopy/Cologuard- due Has had some pre-dm in the past Hx knee arthroscopy in past- some chronic pain, but currently manageable .  Narrative: Cassie [name] is Married and together she and her husband have one 72 year old son who lives at home.  She reports she is safe in his current relationships and home environment.  She does not have a history or partner abuse.  She is currently currently employed.  She endorses normal activities of daily living. She endorses swimming several times a week. She reports overall healthy diet activities.   She denies nicotine use, deniesrecreational drug use, denies frequent alcohol use. Endorses sexual activity  yes, with males one partner- monogomy  She reports some increased spacing between menstrual periods with flow changes consistent with perimenopause. She endorses occasional 6 week spacing and two heavy flow days. She is not planning pregnancy in the near future.  Contraceptive options include condoms She reports STI history of STDs: none and denies concerns for STI today.  She denies recent changes to bowel habits, denies  recent changes to bladder habits, denies recent changes to skin. She is seen by dermatology regularly for skin monitoring.  She denies recent mood related changes. PHQ and GAD listed below.  PHQ9 Today: Depression screen [facility] 2/9 08/04/2020 05/20/2018  Decreased Interest 0 1  Down, Depressed, Hopeless 0 1  PHQ - 2 Score 0 2  Altered sleeping - 1  Tired, decreased energy - 3  Change in appetite - 2  Feeling bad or failure about yourself  - 1  Trouble concentrating - 0  Moving slowly or fidgety/restless - 0  Suicidal thoughts - 0  PHQ-9 Score - 9  Difficult doing work/chores - Not difficult at all   GAD7 Today: No flowsheet data found.  Health Maintenance Due  Topic Date Due  . COLONOSCOPY (Pts 45-31yrs Insurance coverage will need to be confirmed)  Never done     PMH Past Medical History:  Diagnosis Date  . Infertility, female   . Menorrhagia with regular cycle 09/25/2016  . Obesity, Class II, BMI 35-39.9 04/02/2015  . Thyroid disease    thyroid nodules    ROS All review of systems negative except what is listed in the HPI  PHYSICAL EXAM General Appearance:  awake, alert, oriented, in no acute distress, well developed, well nourished and cooperative Skin:  skin color, texture, turgor are normal; there are no bruises, rashes or lesions., scattered nevi Head/face:  NCAT and Temporal wasting Eyes:  No gross abnormalities., PERRL, EOMI and Sclera nonicteric Ears: canals normal. TM's with well healed scarring noted bilaterally on TM's consistent with previous ear infections. Nose/Sinuses:  Nares normal. Septum midline. Mucosa normal. No drainage or sinus tenderness. Mouth/Throat:  Mucosa moist, no lesions; pharynx without erythema, edema or exudate. Neck:  neck- supple, no mass, non-tender, no  bruits and no jvd Back:  no pain to palpation, good flexion and extension, motor and sensory appear to be normal Lungs:  Normal expansion.  Clear to auscultation.  No rales, rhonchi, or  wheezing., No chest wall tenderness., No kyphosis or scoliosis. Heart:  Heart sounds are normal.  Regular rate and rhythm without murmur, gallop or rub. Abdomen:  Soft, non-tender, normal bowel sounds; no bruits, organomegaly or masses. Musculoskeletal:  Spine range of motion normal. Muscular strength intact., Range of motion normal in hips, knees, shoulders, and spine. Peripheral Pulses:  Capillary refill <2secs, strong peripheral pulses Neurologic:  Alert and oriented x 3, gait normal., reflexes normal and symmetric, strength and  sensation grossly normal Psych exam:alert,oriented, in NAD with a full range of affect, normal behavior and no psychotic features  ASSESSMENT AND PLAN Problem List Items Addressed This Visit    Vitamin D deficiency    Historical with Vitamin D replacement Increased fatigue reported- possible causative factor.  Will monitor labs and make changes to plan of care as necessary      Relevant Orders   VITAMIN D 25 Hydroxy (Vit-D Deficiency, Fractures)   Encounter to establish care - Primary    Review of current and past medical history, social history, medication, and family history.  Review of care gaps and health maintenance recommendations.  Records from recent providers to be requested if not available in Chart Review or Care Everywhere.  Recommendations for health maintenance, diet, and exercise provided.  CPE performed today Labs ordered      Encounter for physical examination    CPE today with no unusual findings on exam Will obtain labs for further evaluation F/U in 1 year or sooner if needed       Hemorrhoids    Refill on lidocaine for PRN use.  No current complications      Relevant Medications   lidocaine (XYLOCAINE) 5 % ointment   betamethasone valerate ointment (VALISONE) 0.1 %   Body mass index (BMI) of 32.0-32.9 in adult    BMI 32.60 today Actively working to increase physical activity with frequent use of new gym and pool Recommend  monitor portion sizes and work to attain a minimum of 20 minutes of moderate intensity physical activity per day. Will monitor labs today and make changes as necessary       Other Visit Diagnoses    Lipid screening       Relevant Orders   Lipid panel   Screening for diabetes mellitus       Relevant Orders   Hemoglobin A1c   Comprehensive metabolic panel   Screening for endocrine, nutritional, metabolic and immunity disorder       Relevant Orders   Hemoglobin A1c   Thyroid Panel With TSH   Comprehensive metabolic panel   VITAMIN D 25 Hydroxy (Vit-D Deficiency, Fractures)   Screening for deficiency anemia       Relevant Orders   CBC w/Diff/Platelet   Ferritin   Screen for colon cancer       Relevant Orders   Ambulatory referral to Gastroenterology      Education provided today during visit and on AVS for patient to review at home.  Diet and Exercise recommendations provided.  Current diagnoses and recommendations discussed. HM recommendations reviewed with recommendations.    Outpatient Encounter Medications as of 08/04/2020  Medication Sig  . betamethasone valerate ointment (VALISONE) 0.1 % Apply 1 application topically 2 (two) times daily.  Marland Kitchen lidocaine (XYLOCAINE) 5 % ointment Apply  1 application topically 3 (three) times daily as needed.  Marland Kitchen VITAMIN D PO Take 5,000 Int'l Units by mouth.  . [DISCONTINUED] betamethasone valerate ointment (VALISONE) 0.1 % Apply 1 application topically 2 (two) times daily.  . [DISCONTINUED] lidocaine (XYLOCAINE) 5 % ointment Apply 1 application topically 3 (three) times daily.   No facility-administered encounter medications on file as of 08/04/2020.    Return in about 1 year (around 08/04/2021) for CPE with labs.  Time: 45 minutes, >50% spent counseling, care coordination, chart review, and documentation.   [name], DNP, AGNP-c

## 2020-08-02 NOTE — Patient Instructions (Addendum)
Recommendations from today's visit: . I have ordered labs for you today. If we see something that is abnormal I will let you know and send you recommendations.   Information on diet, exercise, and health maintenance recommendations are listed below. This is information to help you be sure you are on track for optimal health and monitoring.   Please look over this and let us know if you have any questions or if you have completed any of the health maintenance outside of [facility] so that we can be sure your records are up to date.  ___________________________________________________________  Thank you for choosing [place] at [facility] for your Primary Care needs. I am excited for the opportunity to partner with you to meet your health care goals. It was a pleasure meeting you today!  I am an Adult-Geriatric Nurse Practitioner with a background in caring for patients for more than 20 years. I received my Paediatric nurse in Nursing and my Doctor of Nursing Practice degrees at [employer]. I received additional fellowship training in primary care and sports medicine after receiving my doctorate degree. I provide primary care and sports medicine services to patients age 15 and older within this office. I am also a provider with the [facility] and the director of the APP Fellowship with [facility].  I am a [place] native, but have called the [place] area home for nearly 20 years and am proud to be a member of this community.   I am passionate about providing the best service to you through preventive medicine and supportive care. I consider you a part of the medical team and value your input. I work diligently to ensure that you are heard and your needs are met in a safe and effective manner. I want you to feel comfortable with me as your provider and want you to know that your health concerns are important to me.   For your  information, our office hours are Monday- Friday 8:00 AM - 5:00 PM At this time I am not in the office on Wednesdays.  If you have questions or concerns, please call our office at [phone] or send [place] a MyChart message and we will respond as quickly as possible.   For all urgent or time sensitive needs we ask that you please call the office to avoid delays. MyChart is not constantly monitored and replies may take up to 72 business hours.  MyChart Policy: . MyChart allows for you to see your visit notes, after visit summary, provider recommendations, lab and tests results, make an appointment, request refills, and contact your provider or the office for non-urgent questions or concerns.  . Providers are seeing patients during normal business hours and do not have built in time to review MyChart messages. We ask that you allow a minimum of 72 business hours for MyChart message responses.  . Complex MyChart concerns may require a visit. Your provider may request you schedule a virtual or in person visit to ensure we are providing the best care possible. . MyChart messages sent after 4:00 PM on Friday will not be received by the provider until Monday morning.    Lab and Test Results: . You will receive your lab and test results on MyChart as soon as they are completed and results have been sent by the lab or testing facility. Due to this service, you will receive your results BEFORE your provider.  . Please allow  a minimum of 72 business hours for your provider to receive and review lab and test results and contact you about.   . Most lab and test result comments from the provider will be sent through [facility]. Your provider may recommend changes to the plan of care, follow-up visits, repeat testing, ask questions, or request an office visit to discuss these results. You may reply directly to this message or call the office at [phone] to provide information for the provider or set up an  appointment. . In some instances, you will be called with test results and recommendations. Please let us know if this is preferred and we will make note of this in your chart to provide this for you.    . If you have not heard a response to your lab or test results in 72 business hours, please call the office to let us know.   After Hours: . For all non-emergency after hours needs, please call the office at [phone] and select the option to reach the on-call provider service. On-call services are shared between multiple [place] offices and therefore it will not be possible to speak directly with your provider. On-call providers may provide medical advice and recommendations, but are unable to provide refills for maintenance medications.  . For all emergency or urgent medical needs after normal business hours, we recommend that you seek care at the closest Urgent Care or Emergency Department to ensure appropriate treatment in a timely manner.  [facility] [place] [facility] [place] has a 24 hour emergency room located on the ground floor for your convenience.    Please do not hesitate to reach out to [place] with concerns.   Thank you, again, for choosing me as your health care partner. I appreciate your trust and look forward to learning more about you.   Worthy Keeler, DNP, AGNP-c ___________________________________________________________  Health Maintenance Recommendations Screening Testing  Mammogram  Every 1 -2 years based on history and risk factors  Starting at age 50  Pap Smear  Ages 21-39 every 3 years  Ages 43-65 every 5 years with HPV testing  More frequent testing may be required based on results and history  Colon Cancer Screening  Every 1-10 years based on test performed, risk factors, and history  Starting at age 75  Bone Density Screening  Every 2-10 years based on history  Starting at age 31 for women  Recommendations for men differ based on  medication usage, history, and risk factors  AAA Screening  One time ultrasound  Men 52-34 years old who have every smoked  Lung Cancer Screening  Low Dose Lung CT every 12 months  Age 36-80 years with a 30 pack-year smoking history who still smoke or who have quit within the last 15 years  Screening Labs  Routine  Labs: Complete Blood Count (CBC), Complete Metabolic Panel (CMP), Cholesterol (Lipid Panel)  Every 6-12 months based on history and medications  May be recommended more frequently based on current conditions or previous results  Hemoglobin A1c Lab  Every 3-12 months based on history and previous results  Starting at age 62 or earlier with diagnosis of diabetes, high cholesterol, BMI >26, and/or risk factors  Frequent monitoring for patients with diabetes to ensure blood sugar control  Thyroid Panel (TSH w/ T3 & T4)  Every 6 months based on history, symptoms, and risk factors  May be repeated more often if on medication  HIV  One time testing for all patients 13 and  older  May be repeated more frequently for patients with increased risk factors or exposure  Hepatitis C  One time testing for all patients 68 and older  May be repeated more frequently for patients with increased risk factors or exposure  Gonorrhea, Chlamydia  Every 12 months for all sexually active persons 13-24 years  Additional monitoring may be recommended for those who are considered high risk or who have symptoms  PSA  Men 41-4 years old with risk factors  Additional screening may be recommended from age 49-69 based on risk factors, symptoms, and history  Vaccine Recommendations  Tetanus Booster  All adults every 10 years  Flu Vaccine  All patients 6 months and older every year  COVID Vaccine  All patients 12 years and older  Initial dosing with booster  May recommend additional booster based on age and health history  HPV Vaccine  2 doses all patients age  84-26  Dosing may be considered for patients over 26  Shingles Vaccine (Shingrix)  2 doses all adults 53 years and older  Pneumonia (Pneumovax 23)  All adults 54 years and older  May recommend earlier dosing based on health history  Pneumonia (Prevnar 61)  All adults 48 years and older  Dosed 1 year after Pneumovax 23  Additional Screening, Testing, and Vaccinations may be recommended on an individualized basis based on family history, health history, risk factors, and/or exposure.  __________________________________________________________  Diet Recommendations for All Patients  I recommend that all patients maintain a diet low in saturated fats, carbohydrates, and cholesterol. While this can be challenging at first, it is not impossible and small changes can make big differences.  Things to try: Marland Kitchen Decreasing the amount of soda, sweet tea, and/or juice to one or less per day and replace with water o While water is always the first choice, if you do not like water you may consider - adding a water additive without sugar to improve the taste - other sugar free drinks . Replace potatoes with a brightly colored vegetable at dinner . Use healthy oils, such as canola oil or olive oil, instead of butter or hard margarine . Limit your bread intake to two pieces or less a day . Replace regular pasta with low carb pasta options . Bake, broil, or grill foods instead of frying . Monitor portion sizes  . Eat smaller, more frequent meals throughout the day instead of large meals  An important thing to remember is, if you love foods that are not great for your health, you don't have to give them up completely. Instead, allow these foods to be a reward when you have done well. Allowing yourself to still have special treats every once in a while is a nice way to tell yourself thank you for working hard to keep yourself healthy.   Also remember that every day is a new day. If you have a bad  day and "fall off the wagon", you can still climb right back up and keep moving along on your journey!  We have resources available to help you!  Some websites that may be helpful include: . www.http://carter.biz/  . Www.VeryWellFit.com _____________________________________________________________  Activity Recommendations for All Patients  I recommend that all adults get at least 20 minutes of moderate physical activity that elevates your heart rate at least 5 days out of the week.  Some examples include: . Walking or jogging at a pace that allows you to carry on a conversation . Cycling (stationary bike  or outdoors) . Water aerobics . Yoga . Weight lifting . Dancing If physical limitations prevent you from putting stress on your joints, exercise in a pool or seated in a chair are excellent options.  Do determine your MAXIMUM heart rate for activity: YOUR AGE - 220 = MAX HeartRate   Remember! . Do not push yourself too hard.  . Start slowly and build up your pace, speed, weight, time in exercise, etc.  . Allow your body to rest between exercise and get good sleep. . You will need more water than normal when you are exerting yourself. Do not wait until you are thirsty to drink. Drink with a purpose of getting in at least 8, 8 ounce glasses of water a day plus more depending on how much you exercise and sweat.    If you begin to develop dizziness, chest pain, abdominal pain, jaw pain, shortness of breath, headache, vision changes, lightheadedness, or other concerning symptoms, stop the activity and allow your body to rest. If your symptoms are severe, seek emergency evaluation immediately. If your symptoms are concerning, but not severe, please let us know so that we can recommend further evaluation.   ________________________________________________________________

## 2020-08-04 ENCOUNTER — Ambulatory Visit (INDEPENDENT_AMBULATORY_CARE_PROVIDER_SITE_OTHER): Payer: 59 | Admitting: Nurse Practitioner

## 2020-08-04 ENCOUNTER — Encounter (HOSPITAL_BASED_OUTPATIENT_CLINIC_OR_DEPARTMENT_OTHER): Payer: Self-pay | Admitting: Nurse Practitioner

## 2020-08-04 ENCOUNTER — Other Ambulatory Visit: Payer: Self-pay

## 2020-08-04 VITALS — BP 121/78 | HR 68 | Resp 12 | Ht 69.5 in | Wt 224.0 lb

## 2020-08-04 DIAGNOSIS — Z1211 Encounter for screening for malignant neoplasm of colon: Secondary | ICD-10-CM

## 2020-08-04 DIAGNOSIS — Z0000 Encounter for general adult medical examination without abnormal findings: Secondary | ICD-10-CM | POA: Insufficient documentation

## 2020-08-04 DIAGNOSIS — Z1329 Encounter for screening for other suspected endocrine disorder: Secondary | ICD-10-CM | POA: Diagnosis not present

## 2020-08-04 DIAGNOSIS — Z6832 Body mass index (BMI) 32.0-32.9, adult: Secondary | ICD-10-CM | POA: Diagnosis not present

## 2020-08-04 DIAGNOSIS — E559 Vitamin D deficiency, unspecified: Secondary | ICD-10-CM

## 2020-08-04 DIAGNOSIS — Z13220 Encounter for screening for lipoid disorders: Secondary | ICD-10-CM

## 2020-08-04 DIAGNOSIS — Z7689 Persons encountering health services in other specified circumstances: Secondary | ICD-10-CM | POA: Diagnosis not present

## 2020-08-04 DIAGNOSIS — Z130 Encounter for screening for diseases of the blood and blood-forming organs and certain disorders involving the immune mechanism: Secondary | ICD-10-CM | POA: Diagnosis not present

## 2020-08-04 DIAGNOSIS — K649 Unspecified hemorrhoids: Secondary | ICD-10-CM | POA: Insufficient documentation

## 2020-08-04 DIAGNOSIS — Z131 Encounter for screening for diabetes mellitus: Secondary | ICD-10-CM | POA: Diagnosis not present

## 2020-08-04 DIAGNOSIS — Z1321 Encounter for screening for nutritional disorder: Secondary | ICD-10-CM

## 2020-08-04 DIAGNOSIS — Z13228 Encounter for screening for other metabolic disorders: Secondary | ICD-10-CM

## 2020-08-04 MED ORDER — LIDOCAINE 5 % EX OINT: 1.0000 "application " | TOPICAL_OINTMENT | Freq: Three times a day (TID) | CUTANEOUS | 2 refills | Status: DC | PRN

## 2020-08-04 MED ORDER — BETAMETHASONE VALERATE 0.1 % EX OINT: 1.0000 "application " | TOPICAL_OINTMENT | Freq: Two times a day (BID) | CUTANEOUS | 2 refills | Status: DC

## 2020-08-04 NOTE — Assessment & Plan Note (Signed)
Historical with Vitamin D replacement Increased fatigue reported- possible causative factor.  Will monitor labs and make changes to plan of care as necessary

## 2020-08-04 NOTE — Assessment & Plan Note (Signed)
CPE today with no unusual findings on exam Will obtain labs for further evaluation F/U in 1 year or sooner if needed

## 2020-08-04 NOTE — Assessment & Plan Note (Signed)
Review of current and past medical history, social history, medication, and family history.  Review of care gaps and health maintenance recommendations.  Records from recent providers to be requested if not available in Chart Review or Care Everywhere.  Recommendations for health maintenance, diet, and exercise provided.  CPE performed today Labs ordered

## 2020-08-04 NOTE — Assessment & Plan Note (Signed)
BMI 32.60 today Actively working to increase physical activity with frequent use of new gym and pool Recommend monitor portion sizes and work to attain a minimum of 20 minutes of moderate intensity physical activity per day. Will monitor labs today and make changes as necessary

## 2020-08-04 NOTE — Assessment & Plan Note (Signed)
Refill on lidocaine for PRN use.  No current complications

## 2020-08-25 DIAGNOSIS — Z6832 Body mass index (BMI) 32.0-32.9, adult: Secondary | ICD-10-CM | POA: Diagnosis not present

## 2020-08-25 DIAGNOSIS — E559 Vitamin D deficiency, unspecified: Secondary | ICD-10-CM | POA: Diagnosis not present

## 2020-08-25 DIAGNOSIS — Z0000 Encounter for general adult medical examination without abnormal findings: Secondary | ICD-10-CM | POA: Diagnosis not present

## 2020-08-25 DIAGNOSIS — Z7689 Persons encountering health services in other specified circumstances: Secondary | ICD-10-CM | POA: Diagnosis not present

## 2020-08-25 DIAGNOSIS — Z130 Encounter for screening for diseases of the blood and blood-forming organs and certain disorders involving the immune mechanism: Secondary | ICD-10-CM | POA: Diagnosis not present

## 2020-08-25 DIAGNOSIS — Z13220 Encounter for screening for lipoid disorders: Secondary | ICD-10-CM | POA: Diagnosis not present

## 2020-08-25 DIAGNOSIS — Z131 Encounter for screening for diabetes mellitus: Secondary | ICD-10-CM | POA: Diagnosis not present

## 2020-08-25 NOTE — Addendum Note (Signed)
Addended by: Campbell Riches on: 08/25/2020 09:22 AM   Modules accepted: Orders

## 2020-08-26 LAB — COMPREHENSIVE METABOLIC PANEL
ALT: 14 IU/L (ref 0–32)
AST: 14 IU/L (ref 0–40)
Albumin/Globulin Ratio: 1.4 (ref 1.2–2.2)
Albumin: 4.3 g/dL (ref 3.8–4.8)
Alkaline Phosphatase: 76 IU/L (ref 44–121)
BUN/Creatinine Ratio: 17 (ref 9–23)
BUN: 14 mg/dL (ref 6–24)
Bilirubin Total: 0.6 mg/dL (ref 0.0–1.2)
CO2: 23 mmol/L (ref 20–29)
Calcium: 9.3 mg/dL (ref 8.7–10.2)
Chloride: 102 mmol/L (ref 96–106)
Creatinine, Ser: 0.84 mg/dL (ref 0.57–1.00)
Globulin, Total: 3 g/dL (ref 1.5–4.5)
Glucose: 97 mg/dL (ref 65–99)
Potassium: 4.3 mmol/L (ref 3.5–5.2)
Sodium: 138 mmol/L (ref 134–144)
Total Protein: 7.3 g/dL (ref 6.0–8.5)
eGFR: 86 mL/min/{1.73_m2} (ref >59)

## 2020-08-26 LAB — CBC WITH DIFFERENTIAL/PLATELET
Basophils Absolute: 0 10*3/uL (ref 0.0–0.2)
Basos: 0 %
EOS (ABSOLUTE): 0.1 10*3/uL (ref 0.0–0.4)
Eos: 1 %
Hematocrit: 39 % (ref 34.0–46.6)
Hemoglobin: 12.9 g/dL (ref 11.1–15.9)
Immature Grans (Abs): 0 10*3/uL (ref 0.0–0.1)
Immature Granulocytes: 0 %
Lymphocytes Absolute: 3.1 10*3/uL (ref 0.7–3.1)
Lymphs: 42 %
MCH: 31.9 pg (ref 26.6–33.0)
MCHC: 33.1 g/dL (ref 31.5–35.7)
MCV: 96 fL (ref 79–97)
Monocytes Absolute: 0.7 10*3/uL (ref 0.1–0.9)
Monocytes: 9 %
Neutrophils Absolute: 3.5 10*3/uL (ref 1.4–7.0)
Neutrophils: 48 %
Platelets: 224 10*3/uL (ref 150–450)
RBC: 4.05 x10E6/uL (ref 3.77–5.28)
RDW: 12.5 % (ref 11.7–15.4)
WBC: 7.3 10*3/uL (ref 3.4–10.8)

## 2020-08-26 LAB — LIPID PANEL
Chol/HDL Ratio: 3.2 ratio (ref 0.0–4.4)
Cholesterol, Total: 184 mg/dL (ref 100–199)
HDL: 58 mg/dL (ref >39)
LDL Chol Calc (NIH): 115 mg/dL — ABNORMAL HIGH (ref 0–99)
Triglycerides: 55 mg/dL (ref 0–149)
VLDL Cholesterol Cal: 11 mg/dL (ref 5–40)

## 2020-08-26 LAB — VITAMIN D 25 HYDROXY (VIT D DEFICIENCY, FRACTURES): Vit D, 25-Hydroxy: 38 ng/mL (ref 30.0–100.0)

## 2020-08-26 LAB — THYROID PANEL WITH TSH
Free Thyroxine Index: 1.7 (ref 1.2–4.9)
T3 Uptake Ratio: 28 % (ref 24–39)
T4, Total: 6 ug/dL (ref 4.5–12.0)
TSH: 0.669 u[IU]/mL (ref 0.450–4.500)

## 2020-08-26 LAB — HEMOGLOBIN A1C
Est. average glucose Bld gHb Est-mCnc: 108 mg/dL
Hgb A1c MFr Bld: 5.4 % (ref 4.8–5.6)

## 2020-08-28 NOTE — Progress Notes (Signed)
Labs look great! LDL is very slightly elevated, but not too concerning. Recommend monitoring saturated fats in diet.  Vitamin D is within normal range, but still on the low side. Increased sunlight this summer should help.  No Changes to plan of care at this time.

## 2020-08-29 ENCOUNTER — Telehealth (HOSPITAL_BASED_OUTPATIENT_CLINIC_OR_DEPARTMENT_OTHER): Payer: Self-pay

## 2020-08-29 NOTE — Telephone Encounter (Signed)
-----   Message from [name], NP sent at 08/28/2020  7:14 AM EDT ----- Labs look great! LDL is very slightly elevated, but not too concerning. Recommend monitoring saturated fats in diet.  Vitamin D is within normal range, but still on the low side. Increased sunlight this summer should help.  No Changes to plan of care at this time.

## 2020-08-29 NOTE — Telephone Encounter (Signed)
Left message for patient stating her labs have resulted and are available on MyChart.  Instructed her to call with questions or concerns.

## 2020-10-24 DIAGNOSIS — S6981XA Other specified injuries of right wrist, hand and finger(s), initial encounter: Secondary | ICD-10-CM | POA: Diagnosis not present

## 2020-10-31 DIAGNOSIS — M79641 Pain in right hand: Secondary | ICD-10-CM | POA: Diagnosis not present

## 2020-11-09 ENCOUNTER — Ambulatory Visit (INDEPENDENT_AMBULATORY_CARE_PROVIDER_SITE_OTHER): Payer: 59 | Admitting: Bariatrics

## 2020-11-09 ENCOUNTER — Other Ambulatory Visit: Payer: Self-pay

## 2020-11-09 ENCOUNTER — Encounter (INDEPENDENT_AMBULATORY_CARE_PROVIDER_SITE_OTHER): Payer: Self-pay | Admitting: Bariatrics

## 2020-11-09 VITALS — BP 105/71 | HR 76 | Temp 98.4°F | Ht 70.0 in | Wt 228.0 lb

## 2020-11-09 DIAGNOSIS — Z0289 Encounter for other administrative examinations: Secondary | ICD-10-CM

## 2020-11-09 DIAGNOSIS — E538 Deficiency of other specified B group vitamins: Secondary | ICD-10-CM | POA: Diagnosis not present

## 2020-11-09 DIAGNOSIS — R0602 Shortness of breath: Secondary | ICD-10-CM | POA: Diagnosis not present

## 2020-11-09 DIAGNOSIS — E669 Obesity, unspecified: Secondary | ICD-10-CM | POA: Diagnosis not present

## 2020-11-09 DIAGNOSIS — Z1331 Encounter for screening for depression: Secondary | ICD-10-CM | POA: Diagnosis not present

## 2020-11-09 DIAGNOSIS — Z9189 Other specified personal risk factors, not elsewhere classified: Secondary | ICD-10-CM | POA: Diagnosis not present

## 2020-11-09 DIAGNOSIS — R7303 Prediabetes: Secondary | ICD-10-CM

## 2020-11-09 DIAGNOSIS — R5383 Other fatigue: Secondary | ICD-10-CM | POA: Diagnosis not present

## 2020-11-09 DIAGNOSIS — Z6832 Body mass index (BMI) 32.0-32.9, adult: Secondary | ICD-10-CM | POA: Diagnosis not present

## 2020-11-10 LAB — COMPREHENSIVE METABOLIC PANEL
ALT: 10 IU/L (ref 0–32)
AST: 16 IU/L (ref 0–40)
Albumin/Globulin Ratio: 1.5 (ref 1.2–2.2)
Albumin: 4.2 g/dL (ref 3.8–4.8)
Alkaline Phosphatase: 73 IU/L (ref 44–121)
BUN/Creatinine Ratio: 14 (ref 9–23)
BUN: 10 mg/dL (ref 6–24)
Bilirubin Total: 0.6 mg/dL (ref 0.0–1.2)
CO2: 24 mmol/L (ref 20–29)
Calcium: 9.2 mg/dL (ref 8.7–10.2)
Chloride: 104 mmol/L (ref 96–106)
Creatinine, Ser: 0.72 mg/dL (ref 0.57–1.00)
Globulin, Total: 2.8 g/dL (ref 1.5–4.5)
Glucose: 93 mg/dL (ref 65–99)
Potassium: 4.4 mmol/L (ref 3.5–5.2)
Sodium: 141 mmol/L (ref 134–144)
Total Protein: 7 g/dL (ref 6.0–8.5)
eGFR: 103 mL/min/{1.73_m2} (ref >59)

## 2020-11-10 LAB — VITAMIN B12: Vitamin B-12: 653 pg/mL (ref 232–1245)

## 2020-11-10 LAB — INSULIN, RANDOM: INSULIN: 5.2 u[IU]/mL (ref 2.6–24.9)

## 2020-11-13 ENCOUNTER — Encounter (INDEPENDENT_AMBULATORY_CARE_PROVIDER_SITE_OTHER): Payer: Self-pay | Admitting: Bariatrics

## 2020-11-13 NOTE — Progress Notes (Signed)
Chief Complaint:   OBESITY Cassie [name] (MR# [id number]) is a 50 y.o. female who presents for evaluation and treatment of obesity and related comorbidities. Current BMI is Body mass index is 32.71 kg/m. Cassie [name] has been struggling with her weight for many years and has been unsuccessful in either losing weight, maintaining weight loss, or reaching her healthy weight goal.  Cassie [name] is currently in the action stage of change and ready to dedicate time achieving and maintaining a healthier weight. Cassie [name] is interested in becoming our patient and working on intensive lifestyle modifications including (but not limited to) diet and exercise for weight loss.  Cassie [name] is a returning patient to our office, and recently saw Drs. Leafy Ro and Momence.  Her last visit was on 02/24/2019.  She likes to cook, but notes time as an obstacle.  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  Depression Screen Cassie [name]'s Food and Mood (modified PHQ-9) score was 6.  Depression screen Cassie [name] 2/9 11/09/2020  Decreased Interest 1  Down, Depressed, Hopeless 0  PHQ - 2 Score 1  Altered sleeping 0  Tired, decreased energy 2  Change in appetite 2  Feeling bad or failure about yourself  1  Trouble concentrating 0  Moving slowly or fidgety/restless 0  Suicidal thoughts 0  PHQ-9 Score 6  Difficult doing work/chores Not difficult at all   Subjective:   1. Other fatigue Cassie [name] admits to daytime somnolence and reports waking up still tired. Patent has a history of symptoms of daytime fatigue and morning fatigue. Cassie [name]  generally gets  6-8  hours of sleep per night, and states that she has generally restful sleep. Snoring is not present. Apneic episodes are not present. Epworth Sleepiness Score is 7.  Occurs with certain activities.   2. SOB (shortness of breath) on exertion Cassie [name] notes increasing shortness of breath with exercising and seems to be worsening over time with weight gain. She notes getting out of breath sooner with activity than she used to. This has not gotten worse recently. [name] denies shortness of breath at rest or orthopnea.  Occurs with certain activities.  3. Prediabetes Cassie [name] has a diagnosis of prediabetes based on her elevated HgA1c and was informed this puts her at greater risk of developing diabetes. She continues to work on diet and exercise to decrease her risk of diabetes. She denies nausea or hypoglycemia.  She is on no medications.  Elevated A1c at 5.7 on 3/20.  At last check, 5.4.  Lab Results  Component Value Date   HGBA1C 5.4 08/25/2020   Lab Results  Component Value Date   INSULIN 5.2 11/09/2020   INSULIN 4.4 02/24/2019   INSULIN 5.8 05/20/2018   4. Vitamin B 12 deficiency Cassie [name] is not on a vitamin B12 supplement.  Lab Results  Component Value Date   [id number] 11/09/2020   5. Depression screen Cassie [name] was screened for depression as part of her new patient workup today.  PHQ-9 is 6.  6. At risk for activity intolerance Cassie [name] is at risk for activity intolerance due to obesity, weather, and back/joint pain.  Assessment/Plan:  1. Other fatigue [name] does feel that her weight is causing her energy to be lower than it should be. Fatigue may be related to obesity, depression or many other causes. Labs will be ordered, and in the meanwhile, [name] will focus on self care including making healthy food choices, increasing physical activity and focusing on stress reduction.  Gradually increase activities/exercise.  - EKG 12-Lead - Vitamin B12 - Comprehensive metabolic  panel - Insulin, random  2. SOB (shortness of breath) on exertion [name] does not feel that she gets out of breath more easily that she used to when she exercises. [name]'s shortness of breath appears to be obesity related and exercise induced. She has agreed to work on weight loss and gradually increase exercise to treat her exercise induced shortness of breath. Will continue to monitor closely.  Gradually increase activities/exercise.  3. Prediabetes [name] will continue to work on weight loss, exercise, and decreasing simple carbohydrates to help decrease the risk of diabetes. Will check CMP and insulin level today.  - Comprehensive metabolic panel - Insulin, random  4. Vitamin B 12 deficiency The diagnosis was reviewed with the patient. Counseling provided today, see below. We will continue to monitor.  Will check B12 today.  Counseling The body needs vitamin B12: to make red blood cells; to make DNA; and to help the nerves work properly so they can carry messages from the brain to the body.  The main causes of vitamin B12 deficiency include dietary deficiency, digestive diseases, pernicious anemia, and having a surgery in which part of the stomach or small intestine is removed.  Certain medicines can make it harder for the body to absorb vitamin B12. These medicines include: heartburn medications; some antibiotics; some medications used to treat diabetes, gout, and high cholesterol.  In some cases, there are no symptoms of this condition. If the condition leads to anemia or nerve damage, various symptoms can occur, such as weakness or fatigue, shortness of breath, and numbness or tingling in your hands and feet.   Treatment:  May include taking vitamin B12 supplements.  Avoid alcohol.  Eat lots of healthy foods that contain vitamin B12: Beef, pork, chicken, [place], and organ meats, such as liver.  Seafood: This includes clams, rainbow trout, salmon, tuna, and haddock. Eggs.  Cereal and dairy  products that are fortified: This means that vitamin B12 has been added to the food.   - Vitamin B12  5. Depression screen Cassie [name] had a positive depression screening. Depression is commonly associated with obesity and often results in emotional eating behaviors. We will monitor this closely and work on CBT to help improve the non-hunger eating patterns. Referral to Psychology may be required if no improvement is seen as she continues in our clinic.  6. At risk for activity intolerance Cassie [name] was given approximately 15 minutes of exercise intolerance counseling today. She is 50 y.o. female and has risk factors exercise intolerance including obesity. We discussed intensive lifestyle modifications today with an emphasis on specific weight loss instructions and strategies. Cassie [name] will slowly increase activity as tolerated.  Repetitive spaced learning was employed today to elicit superior memory formation and behavioral change.   7. Class 1 obesity with serious comorbidity and body mass index (BMI) of 32.0 to 32.9 in adult, unspecified obesity type  Cassie [name] is currently in the action stage of change and her goal is to continue with weight loss efforts. I recommend Cassie [name] begin the structured treatment plan as follows:  She has agreed to the  Category 3 Plan.  She will work on meal planning and decreasing snacking.  We reviewed labs from 08/25/2020, including CMP, lipids, vitamin D, CBC, A1c, and thyroid panel.  Exercise goals: No exercise has been prescribed at this time.   Behavioral modification strategies: increasing lean protein intake, decreasing simple carbohydrates, increasing vegetables, increasing water intake, decreasing eating out, no skipping meals, meal planning and cooking strategies, keeping healthy foods in the home, and planning for success.  She was informed of the importance of frequent follow-up visits to maximize her success with intensive lifestyle modifications for her multiple health  conditions. She was informed we would discuss her lab results at her next visit unless there is a critical issue that needs to be addressed sooner. Cassie [name] agreed to keep her next visit at the agreed upon time to discuss these results.  Objective:   Blood pressure 105/71, pulse 76, temperature 98.4 F (36.9 C), height '5\' 10"'$  (1.778 m), weight 228 lb (103.4 kg), last menstrual period 10/12/2020, SpO2 97 %. Body mass index is 32.71 kg/m.  EKG: Normal sinus rhythm, rate 78 bpm.  Possible left atrial enlargement. Negative precordial T-waves.  Indirect Calorimeter completed today shows a VO2 of 279 and a REE of 1930.  Her calculated basal metabolic rate is [id number] thus her basal metabolic rate is better than expected.  General: Cooperative, alert, well developed, in no acute distress. HEENT: Conjunctivae and lids unremarkable. Cardiovascular: Regular rhythm.  Lungs: Normal work of breathing. Neurologic: No focal deficits.   Lab Results  Component Value Date   CREATININE 0.72 11/09/2020   BUN 10 11/09/2020   NA 141 11/09/2020   K 4.4 11/09/2020   CL 104 11/09/2020   CO2 24 11/09/2020   Lab Results  Component Value Date   ALT 10 11/09/2020   AST 16 11/09/2020   ALKPHOS 73 11/09/2020   BILITOT 0.6 11/09/2020   Lab Results  Component Value Date   HGBA1C 5.4 08/25/2020   HGBA1C 5.4 02/24/2019   HGBA1C 5.7 (H) 05/20/2018   HGBA1C 5.4 09/25/2016   HGBA1C 5.8 (H) 06/29/2014   Lab Results  Component Value Date   INSULIN 5.2 11/09/2020   INSULIN 4.4 02/24/2019   INSULIN 5.8 05/20/2018   Lab Results  Component Value Date   TSH 0.669 08/25/2020   Lab Results  Component Value Date   CHOL 184 08/25/2020   HDL 58 08/25/2020   LDLCALC 115 (H) 08/25/2020   TRIG 55 08/25/2020   CHOLHDL 3.2 08/25/2020   Lab Results  Component Value Date   WBC 7.3 08/25/2020   HGB 12.9 08/25/2020   HCT 39.0 08/25/2020   MCV 96 08/25/2020   PLT 224 08/25/2020   Lab Results  Component Value Date    IRON 149 09/25/2016   TIBC 277 09/25/2016   FERRITIN 32 09/25/2016   Attestation Statements:   Reviewed by clinician on day of visit: allergies, medications, problem list, medical history, surgical history, family history, social history, and previous encounter notes.  I, Water quality scientist, CMA, am acting as Location manager for [employer], DO  I have reviewed the above documentation for accuracy and completeness, and I agree with the above. Jearld Lesch, DO

## 2020-11-21 ENCOUNTER — Ambulatory Visit (INDEPENDENT_AMBULATORY_CARE_PROVIDER_SITE_OTHER): Payer: 59 | Admitting: Podiatry

## 2020-11-21 ENCOUNTER — Other Ambulatory Visit: Payer: Self-pay

## 2020-11-21 ENCOUNTER — Ambulatory Visit (INDEPENDENT_AMBULATORY_CARE_PROVIDER_SITE_OTHER): Payer: 59

## 2020-11-21 DIAGNOSIS — M722 Plantar fascial fibromatosis: Secondary | ICD-10-CM | POA: Diagnosis not present

## 2020-11-21 DIAGNOSIS — M7732 Calcaneal spur, left foot: Secondary | ICD-10-CM

## 2020-11-21 MED ORDER — MELOXICAM 15 MG PO TABS: 15.0000 mg | ORAL_TABLET | Freq: Every day | ORAL | 0 refills | Status: AC

## 2020-11-21 MED ORDER — TRIAMCINOLONE ACETONIDE 10 MG/ML IJ SUSP: 10.0000 mg | Freq: Once | INTRAMUSCULAR | Status: DC

## 2020-11-21 NOTE — Patient Instructions (Signed)

## 2020-11-25 NOTE — Progress Notes (Signed)
Subjective:   Patient ID: Cassie [name], female   DOB: 50 y.o.   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  She states it is worse in the morning or after being on her feet all day at work.  She tried Voltaren gel but this seemed to cause her foot to break out.  She has tried changing shoes and wearing shoes with better arch support but she is continue get pain in the bottom of her heel.  No radiating pain.  No injury.  No other concerns.   Review of Systems  All other systems reviewed and are negative.  Past Medical History:  Diagnosis Date   Back pain    Infertility, female    Joint pain    Menorrhagia with regular cycle 09/25/2016   Obesity, Class II, BMI 35-39.9 04/02/2015   Pre-diabetes    Thyroid disease    thyroid nodules    Past Surgical History:  Procedure Laterality Date   BREAST BIOPSY  08/2017   left breast   ivf      KNEE ARTHROSCOPY     polpys       Current Outpatient Medications:    meloxicam (MOBIC) 15 MG tablet, Take 1 tablet (15 mg total) by mouth daily., Disp: 30 tablet, Rfl: 0  Current Facility-Administered Medications:    triamcinolone acetonide (KENALOG) 10 MG/ML injection 10 mg, 10 mg, Other, Once, Laronica Bhagat, Bonna Gains, DPM  No Known Allergies        Objective:  Physical Exam  General: AAO x3, NAD  Dermatological: Skin is warm, dry and supple bilateral.  There are no open sores, no preulcerative lesions, no rash or signs of infection present.  Vascular: Dorsalis Pedis artery and Posterior Tibial artery pedal pulses are 2/4 bilateral with immedate capillary fill time.  There is no pain with calf compression, swelling, warmth, erythema.   Neruologic: Grossly intact via light touch bilateral.   Musculoskeletal: Tenderness to palpation along the plantar medial tubercle of the calcaneus at the insertion of plantar fascia on the left foot. There is no  pain along the course of the plantar fascia within the arch of the foot. Plantar fascia appears to be intact. There is no pain with lateral compression of the calcaneus or pain with vibratory sensation. There is no pain along the course or insertion of the achilles tendon. No other areas of tenderness to bilateral lower extremities. Muscular strength 5/5 in all groups tested bilateral.  Gait: Unassisted, Nonantalgic.       Assessment:   Left heel pain, Plantar fasciitis     Plan:  -Treatment options discussed including all alternatives, risks, and complications -Etiology of symptoms were discussed -X-rays were obtained and reviewed with the patient.  There is no evidence of acute fracture or stress fracture identified today.  Heel spurs are present. -Steroid injection performed.  See procedure note below. -Meloxicam -Plantar fascial brace -Discussed stretching, icing daily.  Discussed shoe modifications and arch supports.  Procedure: Injection Tendon/Ligament Discussed alternatives, risks, complications and verbal consent was obtained.  Location: Left plantar fascia at the glabrous junction; medial approach. Skin Prep: Alcohol. Injectate: 0.5cc 0.5% marcaine plain, 0.5 cc 2% lidocaine plain and, 1 cc kenalog 10. Disposition: Patient tolerated procedure well. Injection site dressed with a band-aid.  Post-injection care was discussed and return precautions discussed.   No follow-ups on file.  Trula Slade DPM

## 2020-11-27 ENCOUNTER — Ambulatory Visit (INDEPENDENT_AMBULATORY_CARE_PROVIDER_SITE_OTHER): Payer: 59 | Admitting: Bariatrics

## 2020-11-27 ENCOUNTER — Encounter (INDEPENDENT_AMBULATORY_CARE_PROVIDER_SITE_OTHER): Payer: Self-pay | Admitting: Bariatrics

## 2020-11-27 ENCOUNTER — Other Ambulatory Visit: Payer: Self-pay

## 2020-11-27 VITALS — BP 116/88 | HR 79 | Temp 98.1°F | Ht 70.0 in | Wt 224.0 lb

## 2020-11-27 DIAGNOSIS — E041 Nontoxic single thyroid nodule: Secondary | ICD-10-CM

## 2020-11-27 DIAGNOSIS — E559 Vitamin D deficiency, unspecified: Secondary | ICD-10-CM

## 2020-11-27 DIAGNOSIS — E669 Obesity, unspecified: Secondary | ICD-10-CM

## 2020-11-27 DIAGNOSIS — Z9189 Other specified personal risk factors, not elsewhere classified: Secondary | ICD-10-CM

## 2020-11-27 DIAGNOSIS — Z6832 Body mass index (BMI) 32.0-32.9, adult: Secondary | ICD-10-CM | POA: Diagnosis not present

## 2020-11-27 NOTE — Progress Notes (Signed)
Chief Complaint:   OBESITY Cassie [name] is here to discuss her progress with her obesity treatment plan along with follow-up of her obesity related diagnoses. Cassie [name] is on the Category 3 Plan and states she is following her eating plan approximately 9% of the time. Cassie [name] states she is doing 0 minutes 0 times per week.  Today's visit was #: 3 Starting weight: 228 lbs Starting date: 11/09/2020 Today's weight: 224 lbs Today's date: 11/27/2020 Total lbs lost to date: 4 lbs Total lbs lost since last in-office visit: 4 lbs  Interim History: Cassie [name] is down 4 lbs since her first visit. She did not find it hard.  Subjective:   1. Thyroid nodule Cassie [name]'s "benign follicular nodule". She had a biopsy it was found to be fluid filled.  2. Vitamin D deficiency Cassie [name] is currently taking OTC Vitamin D.   3. At risk for impaired metabolic function Cassie [name] is at risk for impaired metabolic function due to obesity.  Assessment/Plan:   1. Thyroid nodule She states that due to the diagnosis of " benign follicular nodule " , no follow-up is warranted at this time.   2. Vitamin D deficiency Low Vitamin D level contributes to fatigue and are associated with obesity, breast, and colon cancer. Clarise will continue taking OTC Vitamin D and she will follow-up for routine testing of Vitamin D, at least 2-3 times per year to avoid over-replacement.   3. At risk for impaired metabolic function Cassie [name] was given approximately 15 minutes of impaired  metabolic function prevention counseling today. We discussed intensive lifestyle modifications today with an emphasis on specific nutrition and exercise instructions and strategies.   Repetitive spaced learning was employed today to elicit superior memory formation and behavioral change.   4. Obesity, current BMI 32.2 Cassie [name] is currently in the action stage of change. As such, her goal is to continue with weight loss efforts. She has agreed to the Category 3 Plan.   Cassie [name] will  continue meal planning. She will continue intentional eating. We will review labs from 11/09/2020. She will continue to increase H2O.  Exercise goals:  Cassie [name] has her brace plantar fasciitis updated shoes.  Behavioral modification strategies: increasing lean protein intake, decreasing simple carbohydrates, increasing vegetables, increasing water intake, decreasing eating out, no skipping meals, meal planning and cooking strategies, keeping healthy foods in the home, and planning for success.  Cassie [name] has agreed to follow-up with our clinic in 2 weeks. She was informed of the importance of frequent follow-up visits to maximize her success with intensive lifestyle modifications for her multiple health conditions.   Objective:   Blood pressure 116/88, pulse 79, temperature 98.1 F (36.7 C), height '5\' 10"'$  (1.778 m), weight 224 lb (101.6 kg), SpO2 95 %. Body mass index is 32.14 kg/m.  General: Cooperative, alert, well developed, in no acute distress. HEENT: Conjunctivae and lids unremarkable. Cardiovascular: Regular rhythm.  Lungs: Normal work of breathing. Neurologic: No focal deficits.   Lab Results  Component Value Date   CREATININE 0.72 11/09/2020   BUN 10 11/09/2020   NA 141 11/09/2020   K 4.4 11/09/2020   CL 104 11/09/2020   CO2 24 11/09/2020   Lab Results  Component Value Date   ALT 10 11/09/2020   AST 16 11/09/2020   ALKPHOS 73 11/09/2020   BILITOT 0.6 11/09/2020   Lab Results  Component Value Date   HGBA1C 5.4 08/25/2020   HGBA1C 5.4 02/24/2019   HGBA1C 5.7 (H) 05/20/2018   HGBA1C 5.4 09/25/2016  HGBA1C 5.8 (H) 06/29/2014   Lab Results  Component Value Date   INSULIN 5.2 11/09/2020   INSULIN 4.4 02/24/2019   INSULIN 5.8 05/20/2018   Lab Results  Component Value Date   TSH 0.669 08/25/2020   Lab Results  Component Value Date   CHOL 184 08/25/2020   HDL 58 08/25/2020   LDLCALC 115 (H) 08/25/2020   TRIG 55 08/25/2020   CHOLHDL 3.2 08/25/2020   Lab Results   Component Value Date   VD25OH 38.0 08/25/2020   VD25OH 27.8 (L) 02/24/2019   VD25OH 25.1 (L) 05/20/2018   Lab Results  Component Value Date   WBC 7.3 08/25/2020   HGB 12.9 08/25/2020   HCT 39.0 08/25/2020   MCV 96 08/25/2020   PLT 224 08/25/2020   Lab Results  Component Value Date   IRON 149 09/25/2016   TIBC 277 09/25/2016   FERRITIN 32 09/25/2016   Attestation Statements:   Reviewed by clinician on day of visit: allergies, medications, problem list, medical history, surgical history, family history, social history, and previous encounter notes.  I, Lizbeth Bark, RMA, am acting as Location manager for [employer], DO.   I have reviewed the above documentation for accuracy and completeness, and I agree with the above. Jearld Lesch, DO

## 2020-12-01 ENCOUNTER — Ambulatory Visit: Payer: 59 | Admitting: Podiatry

## 2020-12-11 ENCOUNTER — Ambulatory Visit (INDEPENDENT_AMBULATORY_CARE_PROVIDER_SITE_OTHER): Payer: 59 | Admitting: Orthopaedic Surgery

## 2020-12-11 ENCOUNTER — Other Ambulatory Visit: Payer: Self-pay

## 2020-12-11 DIAGNOSIS — M722 Plantar fascial fibromatosis: Secondary | ICD-10-CM | POA: Diagnosis not present

## 2020-12-11 NOTE — Progress Notes (Signed)
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  She has been dealing with significant left foot pain and plantar fasciitis after going on a trip to [place]  She actually has a trip to [place] coming up.  She saw one of my colleagues Dr. Earleen Newport who is a podiatrist with the [facility].  He did try a steroid injection of her plantar fascial which helped for about 2 days.  She did go per his recommendation to [facility] for being fitted for better shoe wear.  I agree with that treatment plan as well and I would do the same thing.  She did bring with her the new balance shoes that she is wearing.  She is also wearing a plantar fascial brace.  She tried meloxicam but that did cause some heartburn.  She is not on any type of medication such as omeprazole.  Her heel cord is not tight on the left side and she has excellent range of motion of her foot and ankle with normal sensation and a normal arch.  She still has significant pain over the plantar fascial and the heel on the plantar aspect and some over the Achilles at its insertion.  At this point I would send her to outpatient physical therapy for any modalities that they can provide that would help her plantar fasciitis on the left side.  I would not be an advocate of the repeat injection until its been at least 9 to 12 weeks.  I did recommend a night splint that she can get on line and I did not like the shoes that she had just gotten because they are too flexible.  I think she needs something more firm like a new balance 990 which will help more so.  I also recommended that she get over-the-counter omeprazole and take that at bedtime and continue the meloxicam because that will help.  She had a bad reaction dermatologically with Voltaren gel so she cannot try that.  All questions and concerns were answered and addressed.  We could certainly consider repeat injections down the road if needed.

## 2020-12-12 NOTE — Addendum Note (Signed)
Addended by: Robyne Peers on: 12/12/2020 10:38 AM   Modules accepted: Orders

## 2020-12-13 ENCOUNTER — Ambulatory Visit (INDEPENDENT_AMBULATORY_CARE_PROVIDER_SITE_OTHER): Payer: 59 | Admitting: Physician Assistant

## 2020-12-27 ENCOUNTER — Ambulatory Visit (INDEPENDENT_AMBULATORY_CARE_PROVIDER_SITE_OTHER): Payer: 59 | Admitting: Family Medicine

## 2020-12-29 ENCOUNTER — Other Ambulatory Visit: Payer: Self-pay

## 2020-12-29 ENCOUNTER — Ambulatory Visit (HOSPITAL_BASED_OUTPATIENT_CLINIC_OR_DEPARTMENT_OTHER): Payer: 59 | Attending: Orthopaedic Surgery | Admitting: Physical Therapy

## 2020-12-29 DIAGNOSIS — M25572 Pain in left ankle and joints of left foot: Secondary | ICD-10-CM | POA: Insufficient documentation

## 2020-12-29 DIAGNOSIS — R2689 Other abnormalities of gait and mobility: Secondary | ICD-10-CM | POA: Diagnosis not present

## 2020-12-29 NOTE — Therapy (Signed)
OUTPATIENT PHYSICAL THERAPY LOWER EXTREMITY EVALUATION   Patient Name: Cassie [name] MRN: [id number] DOB:03-May-1970, 50 y.o., female Today's Date: 12/31/2020   PT End of Session - 12/31/20 0827     Visit Number 1    Number of Visits 12    Date for PT Re-Evaluation 02/11/21    PT Start Time 1430    PT Stop Time 1514    PT Time Calculation (min) 44 min    Activity Tolerance Patient tolerated treatment well    Behavior During Therapy [facility] for tasks assessed/performed             Past Medical History:  Diagnosis Date   Back pain    Infertility, female    Joint pain    Menorrhagia with regular cycle 09/25/2016   Obesity, Class II, BMI 35-39.9 04/02/2015   Pre-diabetes    Thyroid disease    thyroid nodules   Past Surgical History:  Procedure Laterality Date   BREAST BIOPSY  08/2017   left breast   ivf      KNEE ARTHROSCOPY     polpys     Patient Active Problem List   Diagnosis Date Noted   Encounter to establish care 08/04/2020   Encounter for physical examination 08/04/2020   Hemorrhoids 08/04/2020   Body mass index (BMI) of 32.0-32.9 in adult 08/04/2020   Anxiety 09/23/2018   Thyroid nodule 01/24/2016   Vitamin D deficiency 01/24/2016    PCP: [name], NP  REFERRING PROVIDER: Mcarthur Rossetti*  REFERRING DIAG:   THERAPY DIAG:  Left plantar fascitis   ONSET DATE: August 2022   SUBJECTIVE:   SUBJECTIVE STATEMENT: Patient had an onset of left plantar fascitis in August of 2022. She has increased pain when she stand for a long period of tie. She has gotten new shoes. She has also tried a dorsal night splint without significant improvement. She has a history of right knee pain/surgery.   PERTINENT HISTORY: Right knee pain; upper back pain   PAIN:  Are you having pain? Yes VAS scale:  6/10 Pain location: medial plantar facia  Pain orientation: Left  PAIN TYPE: aching Pain description:  intermittent: no pain at night  Aggravating  factors: standing  Relieving factors: mobic   PRECAUTIONS: None  WEIGHT BEARING RESTRICTIONS No  FALLS:  Has patient fallen in last 6 months? No, Number of falls:   LIVING ENVIRONMENT: Lives with: lives with their family Lives in: House/apartment Stairs: Yes;  Has following equipment at home: None  OCCUPATION: Solicitor   PLOF: Independent  PATIENT GOALS   To have less pain    OBJECTIVE:   DIAGNOSTIC FINDINGS:  X-ray: negative for fx of the foot   COGNITION:  Overall cognitive status: Within functional limits for tasks assessed     SENSATION:  Light touch: Appears intact  Stereognosis: Appears intact  Hot/Cold: Appears intact  Proprioception: Appears intact   POSTURE:  Left foot flatter then the rigt but only mild flat foot. May have a bit of a high arch on the right   LE AROM/PROM:  AROM Right 12/31/2020 Left 12/31/2020  Hip flexion    Hip extension    Hip abduction    Hip adduction    Hip internal rotation    Hip external rotation    Knee flexion    Knee extension    Ankle dorsiflexion 12 12  Ankle plantarflexion    Ankle inversion WNL WNL   Ankle eversion     (  Blank rows = not tested)  LE MMT:  MMT Right 12/31/2020 Left 12/31/2020  Hip flexion 5/5 5/5  Hip extension    Hip abduction 5/5 5/5  Hip adduction    Hip internal rotation    Hip external rotation    Knee flexion    Knee extension    Ankle dorsiflexion 5/5 4+/5   Ankle plantarflexion 5/5 Not tested 2nd to pain   Ankle inversion 5/5 4+/5  Ankle eversion 5/5 5/5   (Blank rows = not tested)       GAIT: Comments: mild decrease in left single leg stance time.     TODAY'S TREATMENT: Manual: trigger point release to medial calf; light stretching of the mid belly of the plantar fascia;    Program Notes using a butter knife handle go up the medial gastroc   move your fingers across the plantar facia    Exercises Seated Ankle Plantar Flexion with  Resistance Loop - 2 x daily - 7 x weekly - 3 sets - 10 reps Seated Ankle Inversion with Resistance - 2 x daily - 7 x weekly - 3 sets - 10 reps   PATIENT EDUCATION:  Education details: self soft tissue mobilization to calf and plantarfacia; HEP  Person educated: Patient Education method: Education officer, environmental  Education comprehension: verbalized understanding, returned demonstration, verbal cues required, tactile cues required, and needs further education   HOME EXERCISE PROGRAM: Access Code: [email or web address] URL: [email or web address] Date: 12/31/2020 Prepared by: Carolyne Littles  Program Notes using a butter knife handle go up the medial gastroc   move your fingers across the plantar facia   ASSESSMENT:  CLINICAL IMPRESSION: Patient is a 50 year old female with left plantar fascitis since August. She has increased pain with standing. She has good ankle mobility. She has tenderness to palpation in her medial calf and significant tenderness around her medial calcaneal tubercle. She has tried a dorsal night splint, has changed her shoe, and she is wearing a plantar fascia sock. Nothing has helped much. Mobic helps but gives her reflux. She would benefit from skilled therapy to improve her ability to stand and walk.   Impairments: Abnormal gait, decreased activity tolerance, decreased endurance, decreased mobility, difficulty walking, decreased strength, increased fascial restrictions, and pain  Activity limitations: community activity, occupation, yard work, and Theatre manager and 1 comorbidity: right knee pain.   REHAB POTENTIAL: Excellent  CLINICAL DECISION MAKING: Stable/uncomplicated  EVALUATION COMPLEXITY: Low   GOALS: Goals reviewed with patient? Yes  SHORT TERM GOALS:  STG Name Target Date Goal status  1 Patient will demonstrate full ankle ROM without pain  Baseline:  01/21/2021 INITIAL  2 Patient will demonstrate 5/5 strength in her left  ankle  Baseline:  01/21/2021 INITIAL  3 Patient will be independent with basic stretching and strengthening program  Baseline: 01/21/2021 INITIAL  LONG TERM GOALS:   LTG Name Target Date Goal status  1 Patient will stand for 2 hours without self reported pain in order to perform surgery  Baseline: 02/11/2021 INITIAL  2 Patient will ambulate community distances without pain in order to perform IADL's  Baseline: 02/11/2021 INITIAL  3 Patient will go up/down 12 steps without pain in order to get around in her house Baseline: 02/11/2021 INITIAL  PLAN: PT FREQUENCY: 1x/week  PT DURATION: 6 weeks  PLANNED INTERVENTIONS: Therapeutic exercises, Therapeutic activity, Neuro Muscular re-education, Balance training, Gait training, Patient/Family education, Joint mobilization, Stair training, Aquatic Therapy, Dry Needling, Cryotherapy, Moist heat, Taping, Ultrasound, Ionotophoresis 4mg /ml  Dexamethasone, and Manual therapy  PLAN FOR NEXT SESSION: Assess big toe mobility; consider needling to plantar fascia; continue with work on gastroc; add standing weight shift. Give patient quad set and SLR for the right knee; add low march for the knee and PF.    Carney Living PT DPT  12/31/2020, 8:45 AM

## 2021-01-17 ENCOUNTER — Encounter (INDEPENDENT_AMBULATORY_CARE_PROVIDER_SITE_OTHER): Payer: Self-pay

## 2021-01-17 ENCOUNTER — Ambulatory Visit (INDEPENDENT_AMBULATORY_CARE_PROVIDER_SITE_OTHER): Payer: 59 | Admitting: Physician Assistant

## 2021-01-31 ENCOUNTER — Encounter: Payer: Self-pay | Admitting: Gastroenterology

## 2021-02-02 ENCOUNTER — Encounter (HOSPITAL_BASED_OUTPATIENT_CLINIC_OR_DEPARTMENT_OTHER): Payer: 59 | Admitting: Physical Therapy

## 2021-02-16 ENCOUNTER — Ambulatory Visit (HOSPITAL_BASED_OUTPATIENT_CLINIC_OR_DEPARTMENT_OTHER): Payer: 59 | Attending: Orthopaedic Surgery | Admitting: Physical Therapy

## 2021-02-16 DIAGNOSIS — M25572 Pain in left ankle and joints of left foot: Secondary | ICD-10-CM | POA: Insufficient documentation

## 2021-02-16 DIAGNOSIS — R2689 Other abnormalities of gait and mobility: Secondary | ICD-10-CM | POA: Insufficient documentation

## 2021-02-23 ENCOUNTER — Encounter (HOSPITAL_BASED_OUTPATIENT_CLINIC_OR_DEPARTMENT_OTHER): Payer: 59 | Admitting: Physical Therapy

## 2021-03-16 ENCOUNTER — Ambulatory Visit (AMBULATORY_SURGERY_CENTER): Payer: 59

## 2021-03-16 ENCOUNTER — Other Ambulatory Visit: Payer: Self-pay

## 2021-03-16 VITALS — Ht 70.0 in | Wt 215.0 lb

## 2021-03-16 DIAGNOSIS — Z1211 Encounter for screening for malignant neoplasm of colon: Secondary | ICD-10-CM

## 2021-03-16 MED ORDER — NA SULFATE-K SULFATE-MG SULF 17.5-3.13-1.6 GM/177ML PO SOLN: 1.0000 | Freq: Once | ORAL | 0 refills | Status: AC

## 2021-03-16 NOTE — Progress Notes (Signed)
Pre visit completed via phone call; Patient verified name, DOB, and address; No egg or soy allergy known to patient  No issues known to pt with past sedation with any surgeries or procedures Patient denies ever being told they had issues or difficulty with intubation  No FH of Malignant Hyperthermia Pt is not on diet pills Pt is not on home 02  Pt is not on blood thinners  Pt denies issues with constipation;  No A fib or A flutter Pt is fully vaccinated for Covid x 2 + boosters; NO PA's for preps discussed with pt in PV today  Discussed with pt there will be an out-of-pocket cost for prep and that varies from $0 to 70 + dollars - pt verbalized understanding  Due to the COVID-19 pandemic we are asking patients to follow certain guidelines in PV and the [place]   Pt aware of COVID protocols and [facility] guidelines

## 2021-03-23 ENCOUNTER — Ambulatory Visit (HOSPITAL_BASED_OUTPATIENT_CLINIC_OR_DEPARTMENT_OTHER): Payer: 59 | Admitting: Physical Therapy

## 2021-03-24 ENCOUNTER — Telehealth: Payer: 59 | Admitting: Emergency Medicine

## 2021-03-24 DIAGNOSIS — J0140 Acute pansinusitis, unspecified: Secondary | ICD-10-CM

## 2021-03-24 MED ORDER — DOXYCYCLINE HYCLATE 100 MG PO TABS: 100.0000 mg | ORAL_TABLET | Freq: Two times a day (BID) | ORAL | 0 refills | Status: AC

## 2021-03-24 NOTE — Progress Notes (Signed)
Virtual Visit Consent   Cassie [name], you are scheduled for a virtual visit with a [facility] provider today.     Just as with appointments in the office, your consent must be obtained to participate.  Your consent will be active for this visit and any virtual visit you may have with one of our providers in the next 365 days.     If you have a MyChart account, a copy of this consent can be sent to you electronically.  All virtual visits are billed to your insurance company just like a traditional visit in the office.    As this is a virtual visit, video technology does not allow for your provider to perform a traditional examination.  This may limit your provider's ability to fully assess your condition.  If your provider identifies any concerns that need to be evaluated in person or the need to arrange testing (such as labs, EKG, etc.), we will make arrangements to do so.     Although advances in technology are sophisticated, we cannot ensure that it will always work on either your end or our end.  If the connection with a video visit is poor, the visit may have to be switched to a telephone visit.  With either a video or telephone visit, we are not always able to ensure that we have a secure connection.     I need to obtain your verbal consent now.   Are you willing to proceed with your visit today? Yes   Elenor Wildes has provided verbal consent on 03/24/2021 for a virtual visit (video or telephone).   Lestine Box, [place]   Date: 03/24/2021 12:09 PM   Virtual Visit via Video Note   I, Lestine Box, connected with  Cassie [name]  ([id number], 10-13-1970) on 03/24/21 at 12:00 PM EST by a video-enabled telemedicine application and verified that I am speaking with the correct person using two identifiers.  Location: Patient: Virtual Visit Location Patient: Home Provider: Virtual Visit Location Provider: Office/Clinic   I discussed the limitations of evaluation and  management by telemedicine and the availability of in person appointments. The patient expressed understanding and agreed to proceed.    History of Present Illness: Cassie [name] is a 51 y.o. who identifies as a female who was assigned female at birth, and is being seen today for sinus pain and pressure x 3 weeks.  Admits to sick contacts in family.  Localizes pain to maxillary and frontal sinuses.  Has tried OTC medications without relief.  Worse to the touch.  Reports similar symptoms in the past with sinus infection.  Denies fever, chills, sore throat, cough, nausea, vomiting, CP, SOB.    HPI: HPI  Problems:  Patient Active Problem List   Diagnosis Date Noted   Encounter to establish care 08/04/2020   Encounter for physical examination 08/04/2020   Hemorrhoids 08/04/2020   Body mass index (BMI) of 32.0-32.9 in adult 08/04/2020   Anxiety 09/23/2018   Thyroid nodule 01/24/2016   Vitamin D deficiency 01/24/2016    Allergies: No Known Allergies Medications:  Current Outpatient Medications:    doxycycline (VIBRA-TABS) 100 MG tablet, Take 1 tablet (100 mg total) by mouth 2 (two) times daily for 10 days., Disp: 20 tablet, Rfl: 0   meloxicam (MOBIC) 15 MG tablet, Take 1 tablet (15 mg total) by mouth daily. (Patient taking differently: Take 15 mg by mouth daily as needed.), Disp: 30 tablet, Rfl: 0  Observations/Objective: Patient is  well-developed, well-nourished in no acute distress.  Resting comfortably at home. Mildly fatigued but nontoxic Head is normocephalic, atraumatic.  No labored breathing. Speaking in full sentences and tolerating own secretions Speech is clear and coherent with logical content.  Patient is alert and oriented at baseline.  TTP over maxillary, ethmoid, and frontal sinuses  Assessment and Plan: 1. Acute non-recurrent pansinusitis  Rest and push fluids Doxycycline prescribed.  Take as directed and to completion Continue with OTC medications as  needed Follow up with PCP as needed Present to UC or the ED if you have any new or worsening symptoms such as fever, worsening sinus pain/ pressure, headache, productive cough, chest pain, shortness of breath, diarrhea, vomiting, etc...  Follow Up Instructions: I discussed the assessment and treatment plan with the patient. The patient was provided an opportunity to ask questions and all were answered. The patient agreed with the plan and demonstrated an understanding of the instructions.  A copy of instructions were sent to the patient via MyChart unless otherwise noted below.    The patient was advised to call back or seek an in-person evaluation if the symptoms worsen or if the condition fails to improve as anticipated.  Time:  I spent 5-10 minutes with the patient via telehealth technology discussing the above problems/concerns.    Lestine Box, PA-C

## 2021-03-24 NOTE — Patient Instructions (Signed)
°  Lyman Speller, thank you for joining Lestine Box, PA-C for today's virtual visit.  While this provider is not your primary care provider (PCP), if your PCP is located in our provider database this encounter information will be shared with them immediately following your visit.  Consent: (Patient) Cassie [name] provided verbal consent for this virtual visit at the beginning of the encounter.  Current Medications:  Current Outpatient Medications:    doxycycline (VIBRA-TABS) 100 MG tablet, Take 1 tablet (100 mg total) by mouth 2 (two) times daily for 10 days., Disp: 20 tablet, Rfl: 0   meloxicam (MOBIC) 15 MG tablet, Take 1 tablet (15 mg total) by mouth daily. (Patient taking differently: Take 15 mg by mouth daily as needed.), Disp: 30 tablet, Rfl: 0   Medications ordered in this encounter:  Meds ordered this encounter  Medications   doxycycline (VIBRA-TABS) 100 MG tablet    Sig: Take 1 tablet (100 mg total) by mouth 2 (two) times daily for 10 days.    Dispense:  20 tablet    Refill:  0    Order Specific Question:   Supervising Provider    Answer:   [name] Heck, BRIAN [[id number]]     *If you need refills on other medications prior to your next appointment, please contact your pharmacy*  Follow-Up: Call back or seek an in-person evaluation if the symptoms worsen or if the condition fails to improve as anticipated.  Other Instructions Rest and push fluids Doxycycline prescribed.  Take as directed and to completion Continue with OTC medications as needed Follow up with PCP as needed Present to UC or the ED if you have any new or worsening symptoms such as fever, worsening sinus pain/ pressure, headache, productive cough, chest pain, shortness of breath, diarrhea, vomiting, etc...   If you have been instructed to have an in-person evaluation today at a local Urgent Care facility, please use the link below. It will take you to a list of all of our available [facility],  including address, phone number and hours of operation. Please do not delay care.  [facility]  If you or a family member do not have a primary care provider, use the link below to schedule a visit and establish care. When you choose a [facility] primary care physician or advanced practice provider, you gain a long-term partner in health. Find a Primary Care Provider  Learn more about [facility]'s in-office and virtual care options: [facility]

## 2021-03-30 ENCOUNTER — Telehealth: Payer: Self-pay | Admitting: Gastroenterology

## 2021-03-30 ENCOUNTER — Encounter (HOSPITAL_BASED_OUTPATIENT_CLINIC_OR_DEPARTMENT_OTHER): Payer: Self-pay | Admitting: Physical Therapy

## 2021-03-30 ENCOUNTER — Ambulatory Visit (HOSPITAL_BASED_OUTPATIENT_CLINIC_OR_DEPARTMENT_OTHER): Payer: 59 | Attending: Orthopaedic Surgery | Admitting: Physical Therapy

## 2021-03-30 ENCOUNTER — Other Ambulatory Visit: Payer: Self-pay

## 2021-03-30 DIAGNOSIS — R2689 Other abnormalities of gait and mobility: Secondary | ICD-10-CM | POA: Diagnosis not present

## 2021-03-30 DIAGNOSIS — M25572 Pain in left ankle and joints of left foot: Secondary | ICD-10-CM | POA: Insufficient documentation

## 2021-03-30 NOTE — Telephone Encounter (Signed)
Pt told her prep was sent to [facility] and [employer] at her PV  Pt RS to 2-10 Fri 4 pm- pt states she does not need new instructions sent as the times are the same and she will change the dates on her packet she has

## 2021-03-30 NOTE — Therapy (Signed)
OUTPATIENT PHYSICAL THERAPY TREATMENT NOTE   Patient Name: Cassie [name] MRN: [id number] DOB:[birth year], 51 y.o., female Today's Date: 04/02/2021  PCP: [name], NP REFERRING PROVIDER: [name], NP   PT End of Session - 04/02/21 [phone]     Number of Visits 12    Date for PT Re-Evaluation 05/11/21    PT Start Time 1430    PT Stop Time 1512    PT Time Calculation (min) 42 min    Activity Tolerance Patient tolerated treatment well    Behavior During Therapy [facility] for tasks assessed/performed              Past Medical History:  Diagnosis Date   Back pain    GERD (gastroesophageal reflux disease)    with certain foods/certain times of day   Infertility, female    Joint pain    Menorrhagia with regular cycle 09/25/2016   Obesity, Class II, BMI 35-39.9 04/02/2015   Plantar fascia syndrome    Pre-diabetes    Thyroid disease    thyroid nodules-bx   Past Surgical History:  Procedure Laterality Date   BREAST BIOPSY Left 08/2017   HYSTEROSCOPY  2012   ivf   2012   KNEE ARTHROSCOPY Right 2006   Patient Active Problem List   Diagnosis Date Noted   Encounter to establish care 08/04/2020   Encounter for physical examination 08/04/2020   Hemorrhoids 08/04/2020   Body mass index (BMI) of 32.0-32.9 in adult 08/04/2020   Anxiety 09/23/2018   Thyroid nodule 01/24/2016   Vitamin D deficiency 01/24/2016   PCP: [name], NP   REFERRING PROVIDER: Mcarthur Rossetti*   REFERRING DIAG:    THERAPY DIAG:  Left plantar fascitis    ONSET DATE: August 2022    SUBJECTIVE:    SUBJECTIVE STATEMENT: Patient has not been able to come in since her initial eval. In that time the patient has had times with significant pain. She is doing a little better right now.   PERTINENT HISTORY: Right knee pain; upper back pain      PAIN:  Are you having pain? Yes 1/16 VAS scale:  2/10 Pain location: medial plantar facia  Pain orientation: Left  PAIN TYPE:  aching Pain description:  intermittent: no pain at night  Aggravating factors: standing  Relieving factors: mobic    PRECAUTIONS: None   WEIGHT BEARING RESTRICTIONS No   FALLS:  Has patient fallen in last 6 months? No, Number of falls:    LIVING ENVIRONMENT: Lives with: lives with their family Lives in: House/apartment Stairs: Yes;  Has following equipment at home: None   OCCUPATION: Gynecological surgeon    PLOF: Independent   PATIENT GOALS   To have less pain      OBJECTIVE:   LE AROM/PROM:   AROM Right 12/31/2020 Left 12/31/2020  Hip flexion      Hip extension      Hip abduction      Hip adduction      Hip internal rotation      Hip external rotation      Knee flexion      Knee extension      Ankle dorsiflexion 13 15  Ankle plantarflexion      Ankle inversion WNL WNL   Ankle eversion  WNL  WNL   (Blank rows = not tested)   LE MMT:   MMT Right 12/31/2020 Left 12/31/2020  Hip flexion 5/5 5/5  Hip extension  Hip abduction 5/5 5/5  Hip adduction      Hip internal rotation      Hip external rotation      Knee flexion      Knee extension      Ankle dorsiflexion 5/5 4+/5   Ankle plantarflexion 5/5 Not tested 2nd to pain   Ankle inversion 5/5 4+/5  Ankle eversion 5/5 5/5   (Blank rows = not tested)             GAIT: Comments: mild decrease in left single leg stance time.     TODAY'S TREATMENT: 03/30/2021 Manual: trigger point release to medial calf; light stretching of the mid belly and insertion of the plantar fascia; IASTYM to medial plantar facia.  DF blue band x20  PF x20  Inversion blue band x20  Heel raise x20    Eval  Manual: trigger point release to medial calf; light stretching of the mid belly of the plantar fascia;      Program Notes using a butter knife handle go up the medial gastroc    move your fingers across the plantar facia      Exercises Seated Ankle Plantar Flexion with Resistance Loop - 2 x daily - 7 x  weekly - 3 sets - 10 reps Seated Ankle Inversion with Resistance - 2 x daily - 7 x weekly - 3 sets - 10 reps     PATIENT EDUCATION:  Education details: self soft tissue mobilization to calf and plantarfacia; HEP  Person educated: Patient Education method: Education officer, environmental  Education comprehension: verbalized understanding, returned demonstration, verbal cues required, tactile cues required, and needs further education     HOME EXERCISE PROGRAM: Access Code: [email or web address] URL: [email or web address] Date: 12/31/2020 Prepared by: Carolyne Littles   Program Notes using a butter knife handle go up the medial gastroc    move your fingers across the plantar facia    ASSESSMENT:   CLINICAL IMPRESSION: Patient returns for PT after a long layoff. Overall despite not having treatment she has improved. She has less tenderness to palpation. She has similar numbers as far as her strength and ROM compared to her last visit. She has less tenderness to palpation in the medial calcaneus. She will be able to come consistently now for the next few weeks. We were more aggressive with her manual therapy today. We will review left knee strengthening next visit as this may be a trigger for her pain.  She had trigger points in her calf today. Continue with therapy 1W6.   Impairments: Abnormal gait, decreased activity tolerance, decreased endurance, decreased mobility, difficulty walking, decreased strength, increased fascial restrictions, and pain   Activity limitations: community activity, occupation, yard work, and Counselling psychologist and 1 comorbidity: right knee pain.    REHAB POTENTIAL: Excellent   CLINICAL DECISION MAKING: Stable/uncomplicated   EVALUATION COMPLEXITY: Low     GOALS: Goals reviewed with patient? Yes   SHORT TERM GOALS:   STG Name Target Date Goal status  1 Patient will demonstrate full ankle ROM without pain  Baseline:  03/20/2021 Ongoing  2 Patient  will demonstrate 5/5 strength in her left ankle  Baseline:  03/20/2021 Ongoing   3 Patient will be independent with basic stretching and strengthening program  Baseline: 03/20/2021 Ongoing   LONG TERM GOALS:    LTG Name Target Date Goal status  1 Patient will stand for 2 hours without self reported pain in order to perform surgery  Baseline: 05/11/2021 Ongoing  2 Patient will ambulate community distances without pain in order to perform IADL's  Baseline: 05/11/2021 Ongoing   3 Patient will go up/down 12 steps without pain in order to get around in her house Baseline: 05/11/2021 Ongoing   PLAN: PT FREQUENCY: 1x/week   PT DURATION: 6 weeks   PLANNED INTERVENTIONS: Therapeutic exercises, Therapeutic activity, Neuro Muscular re-education, Balance training, Gait training, Patient/Family education, Joint mobilization, Stair training, Aquatic Therapy, Dry Needling, Cryotherapy, Moist heat, Taping, Ultrasound, Ionotophoresis 4mg /ml Dexamethasone, and Manual therapy   PLAN FOR NEXT SESSION: Assess big toe mobility; consider needling to plantar fascia; continue with work on gastroc; add standing weight shift. Give patient quad set and SLR for the right knee; add low march for the knee and PF.      Carney Living PT DPT  04/02/2021, 8:30 AM

## 2021-03-30 NOTE — Telephone Encounter (Signed)
Patient called to reschedule appointment to a Friday. Patient stated that she did not know where she needs to pick up prep. Please advise.

## 2021-04-06 ENCOUNTER — Other Ambulatory Visit: Payer: Self-pay

## 2021-04-06 ENCOUNTER — Ambulatory Visit (HOSPITAL_BASED_OUTPATIENT_CLINIC_OR_DEPARTMENT_OTHER): Payer: 59 | Admitting: Physical Therapy

## 2021-04-06 ENCOUNTER — Encounter: Payer: 59 | Admitting: Gastroenterology

## 2021-04-06 ENCOUNTER — Encounter (HOSPITAL_BASED_OUTPATIENT_CLINIC_OR_DEPARTMENT_OTHER): Payer: Self-pay | Admitting: Physical Therapy

## 2021-04-06 DIAGNOSIS — R2689 Other abnormalities of gait and mobility: Secondary | ICD-10-CM

## 2021-04-06 DIAGNOSIS — M25572 Pain in left ankle and joints of left foot: Secondary | ICD-10-CM

## 2021-04-06 NOTE — Therapy (Signed)
OUTPATIENT PHYSICAL THERAPY TREATMENT NOTE   Patient Name: Cassie [name] MRN: [id number] DOB:1970-11-12, 51 y.o., female Today's Date: 04/08/2021  PCP: [name], NP REFERRING PROVIDER: [name], NP   PT End of Session - 04/08/21 1615     Number of Visits 12    Date for PT Re-Evaluation 05/11/21    PT Start Time 1515    PT Stop Time 1558    PT Time Calculation (min) 43 min    Activity Tolerance Patient tolerated treatment well    Behavior During Therapy [facility] for tasks assessed/performed               Past Medical History:  Diagnosis Date   Back pain    GERD (gastroesophageal reflux disease)    with certain foods/certain times of day   Infertility, female    Joint pain    Menorrhagia with regular cycle 09/25/2016   Obesity, Class II, BMI 35-39.9 04/02/2015   Plantar fascia syndrome    Pre-diabetes    Thyroid disease    thyroid nodules-bx   Past Surgical History:  Procedure Laterality Date   BREAST BIOPSY Left 08/2017   HYSTEROSCOPY  2012   ivf   2012   KNEE ARTHROSCOPY Right 2006   Patient Active Problem List   Diagnosis Date Noted   Encounter to establish care 08/04/2020   Encounter for physical examination 08/04/2020   Hemorrhoids 08/04/2020   Body mass index (BMI) of 32.0-32.9 in adult 08/04/2020   Anxiety 09/23/2018   Thyroid nodule 01/24/2016   Vitamin D deficiency 01/24/2016   PCP: [name], NP   REFERRING PROVIDER: Mcarthur Rossetti*   REFERRING DIAG:    THERAPY DIAG:  Left plantar fascitis    ONSET DATE: August 2022    SUBJECTIVE:    SUBJECTIVE STATEMENT:  Patient has been more aggressive with her self manual work and the medial portion of her heel is sore.  She has been working on her exercises.   PERTINENT HISTORY: Right knee pain; upper back pain      PAIN:  Are you having pain? Yes 1/20 VAS scale:  4/10 Pain location: medial plantar facia  Pain orientation: Left  PAIN TYPE: aching Pain  description:  intermittent: no pain at night  Aggravating factors: standing  Relieving factors: mobic    PRECAUTIONS: None   WEIGHT BEARING RESTRICTIONS No   FALLS:  Has patient fallen in last 6 months? No, Number of falls:    LIVING ENVIRONMENT: Lives with: lives with their family Lives in: House/apartment Stairs: Yes;  Has following equipment at home: None   OCCUPATION: Gynecological surgeon    PLOF: Independent   PATIENT GOALS   To have less pain      OBJECTIVE:            GAIT: Comments: mild decrease in left single leg stance time.     TODAY'S TREATMENT: 1/20 Manual: trigger point release to medial calf; light stretching of the mid belly and insertion of the plantar fascia; IASTYM to medial plantar facia.  Taping: PF saddle taping medial; fat pad taping  PF x20 Blue  Inversion blue band x20  Heel raise x20  Reviewed slow march with single leg stability. Reviewed the reasoning behind the exercises and how to practise at home.    03/30/2021 Manual: trigger point release to medial calf; light stretching of the mid belly and insertion of the plantar fascia; IASTYM to medial plantar facia.  DF blue band  x20  PF x20  Inversion blue band x20  Heel raise x20    Eval  Manual: trigger point release to medial calf; light stretching of the mid belly of the plantar fascia;      Program Notes using a butter knife handle go up the medial gastroc    move your fingers across the plantar facia      Exercises Seated Ankle Plantar Flexion with Resistance Loop - 2 x daily - 7 x weekly - 3 sets - 10 reps Seated Ankle Inversion with Resistance - 2 x daily - 7 x weekly - 3 sets - 10 reps     PATIENT EDUCATION:  Education details: self soft tissue mobilization to calf and plantarfacia; HEP  Person educated: Patient Education method: Education officer, environmental  Education comprehension: verbalized understanding, returned demonstration, verbal cues required,  tactile cues required, and needs further education     HOME EXERCISE PROGRAM: Access Code: [email or web address] URL: [email or web address] Date: 12/31/2020 Prepared by: Carolyne Littles   Program Notes using a butter knife handle go up the medial gastroc    move your fingers across the plantar facia    ASSESSMENT:   CLINICAL IMPRESSION: Patient tolerated treatment well. She continues to have a large trigger oint in her mid lateral calf. Therapy reviewed home program. She was given a slow march to continue to work on stability. Therapy trialled saddle taping and fat pad taping. She was advised how long to wear the tape for and symptoms of a tape reaction.   Impairments: Abnormal gait, decreased activity tolerance, decreased endurance, decreased mobility, difficulty walking, decreased strength, increased fascial restrictions, and pain   Activity limitations: community activity, occupation, yard work, and Counselling psychologist and 1 comorbidity: right knee pain.    REHAB POTENTIAL: Excellent   CLINICAL DECISION MAKING: Stable/uncomplicated   EVALUATION COMPLEXITY: Low     GOALS: Goals reviewed with patient? Yes   SHORT TERM GOALS:   STG Name Target Date Goal status  1 Patient will demonstrate full ankle ROM without pain  Baseline:  03/20/2021 Ongoing  2 Patient will demonstrate 5/5 strength in her left ankle  Baseline:  03/20/2021 Ongoing   3 Patient will be independent with basic stretching and strengthening program  Baseline: 03/20/2021 Ongoing   LONG TERM GOALS:    LTG Name Target Date Goal status  1 Patient will stand for 2 hours without self reported pain in order to perform surgery  Baseline: 05/11/2021 Ongoing   2 Patient will ambulate community distances without pain in order to perform IADL's  Baseline: 05/11/2021 Ongoing   3 Patient will go up/down 12 steps without pain in order to get around in her house Baseline: 05/11/2021 Ongoing   PLAN: PT FREQUENCY: 1x/week    PT DURATION: 6 weeks   PLANNED INTERVENTIONS: Therapeutic exercises, Therapeutic activity, Neuro Muscular re-education, Balance training, Gait training, Patient/Family education, Joint mobilization, Stair training, Aquatic Therapy, Dry Needling, Cryotherapy, Moist heat, Taping, Ultrasound, Ionotophoresis 4mg /ml Dexamethasone, and Manual therapy   PLAN FOR NEXT SESSION: Assess big toe mobility; consider needling to plantar fascia; continue with work on gastroc; add standing weight shift. Give patient quad set and SLR for the right knee; add low march for the knee and PF.      Carney Living PT DPT  04/08/2021, 4:15 PM

## 2021-04-08 ENCOUNTER — Encounter (HOSPITAL_BASED_OUTPATIENT_CLINIC_OR_DEPARTMENT_OTHER): Payer: Self-pay | Admitting: Physical Therapy

## 2021-04-13 ENCOUNTER — Other Ambulatory Visit: Payer: Self-pay

## 2021-04-13 ENCOUNTER — Ambulatory Visit (HOSPITAL_BASED_OUTPATIENT_CLINIC_OR_DEPARTMENT_OTHER): Payer: 59 | Admitting: Physical Therapy

## 2021-04-13 DIAGNOSIS — M25572 Pain in left ankle and joints of left foot: Secondary | ICD-10-CM | POA: Diagnosis not present

## 2021-04-13 DIAGNOSIS — R2689 Other abnormalities of gait and mobility: Secondary | ICD-10-CM | POA: Diagnosis not present

## 2021-04-13 NOTE — Therapy (Signed)
OUTPATIENT PHYSICAL THERAPY TREATMENT NOTE   Patient Name: Cassie [name] MRN: [id number] DOB:March 26, 1970, 51 y.o., female Today's Date: 04/15/2021  PCP: [name], NP REFERRING PROVIDER: [name], NP   PT End of Session - 04/15/21 0753     Visit Number 4    Number of Visits 12    Date for PT Re-Evaluation 05/11/21    PT Start Time 1430    PT Stop Time 1512    PT Time Calculation (min) 42 min    Activity Tolerance Patient tolerated treatment well    Behavior During Therapy [facility] for tasks assessed/performed                Past Medical History:  Diagnosis Date   Back pain    GERD (gastroesophageal reflux disease)    with certain foods/certain times of day   Infertility, female    Joint pain    Menorrhagia with regular cycle 09/25/2016   Obesity, Class II, BMI 35-39.9 04/02/2015   Plantar fascia syndrome    Pre-diabetes    Thyroid disease    thyroid nodules-bx   Past Surgical History:  Procedure Laterality Date   BREAST BIOPSY Left 08/2017   HYSTEROSCOPY  2012   ivf   2012   KNEE ARTHROSCOPY Right 2006   Patient Active Problem List   Diagnosis Date Noted   Encounter to establish care 08/04/2020   Encounter for physical examination 08/04/2020   Hemorrhoids 08/04/2020   Body mass index (BMI) of 32.0-32.9 in adult 08/04/2020   Anxiety 09/23/2018   Thyroid nodule 01/24/2016   Vitamin D deficiency 01/24/2016   PCP: [name], NP   REFERRING PROVIDER: Mcarthur Rossetti*   REFERRING DIAG:    THERAPY DIAG:  Left plantar fascitis    ONSET DATE: August 2022    SUBJECTIVE:    SUBJECTIVE STATEMENT:  Patient reports she had a few days without pain. She took the tape off then had 2-3 days on her feet where she had heavy surgical days She is now in significant pain.   PERTINENT HISTORY: Right knee pain; upper back pain      PAIN:  Are you having pain? Yes 1/20 VAS scale:  4/10 Pain location: medial plantar facia  Pain  orientation: Left  PAIN TYPE: aching Pain description:  intermittent: no pain at night  Aggravating factors: standing  Relieving factors: mobic    PRECAUTIONS: None   WEIGHT BEARING RESTRICTIONS No   FALLS:  Has patient fallen in last 6 months? No, Number of falls:    LIVING ENVIRONMENT: Lives with: lives with their family Lives in: House/apartment Stairs: Yes;  Has following equipment at home: None   OCCUPATION: Gynecological surgeon    PLOF: Independent   PATIENT GOALS   To have less pain      OBJECTIVE:    Still painful with end range D         GAIT: Comments: mild decrease in left single leg stance time.     TODAY'S TREATMENT: 1/29 Manual: trigger point release to medial calf; light stretching of the mid belly and insertion of the plantar fascia; IASTYM to medial plantar facia.  Taping: PF saddle taping medial; fat pad taping; had patient do the taping on her own today. Therapy reviewed how to perform.   1/20 Manual: trigger point release to medial calf; light stretching of the mid belly and insertion of the plantar fascia; IASTYM to medial plantar facia.  Taping: PF saddle taping  medial; fat pad taping  PF x20 Blue  Inversion blue band x20  Heel raise x20  Reviewed slow march with single leg stability. Reviewed the reasoning behind the exercises and how to practise at home.    03/30/2021 Manual: trigger point release to medial calf; light stretching of the mid belly and insertion of the plantar fascia; IASTYM to medial plantar facia.  DF blue band x20  PF x20  Inversion blue band x20  Heel raise x20    Eval  Manual: trigger point release to medial calf; light stretching of the mid belly of the plantar fascia;      Program Notes using a butter knife handle go up the medial gastroc    move your fingers across the plantar facia      Exercises Seated Ankle Plantar Flexion with Resistance Loop - 2 x daily - 7 x weekly - 3 sets - 10 reps Seated  Ankle Inversion with Resistance - 2 x daily - 7 x weekly - 3 sets - 10 reps     PATIENT EDUCATION:  Education details: self soft tissue mobilization to calf and plantarfacia; HEP  Person educated: Patient Education method: Education officer, environmental  Education comprehension: verbalized understanding, returned demonstration, verbal cues required, tactile cues required, and needs further education     HOME EXERCISE PROGRAM: Access Code: [email or web address] URL: [email or web address] Date: 12/31/2020 Prepared by: Carolyne Littles   Program Notes using a butter knife handle go up the medial gastroc    move your fingers across the plantar facia    ASSESSMENT:   CLINICAL IMPRESSION: The patient was acutely inflamed today. She had significant tenderness to palpation in the calf and the foot. We held off on there-ex today because her mucles were already fatigued. She had a large trigger point in her upper medial and lateral gastroc. We will continue to progress strengthening as tolerated. We will also consider needling in the next visit or two.    Impairments: Abnormal gait, decreased activity tolerance, decreased endurance, decreased mobility, difficulty walking, decreased strength, increased fascial restrictions, and pain   Activity limitations: community activity, occupation, yard work, and Counselling psychologist and 1 comorbidity: right knee pain.    REHAB POTENTIAL: Excellent   CLINICAL DECISION MAKING: Stable/uncomplicated   EVALUATION COMPLEXITY: Low     GOALS: Goals reviewed with patient? Yes   SHORT TERM GOALS:   STG Name Target Date Goal status 1/29  1 Patient will demonstrate full ankle ROM without pain  Baseline:  03/20/2021 Progressing DF with pain at end range   2 Patient will demonstrate 5/5 strength in her left ankle  Baseline:  03/20/2021 Progressing strengthening   3 Patient will be independent with basic stretching and strengthening program  Baseline:  03/20/2021 Ongoing   LONG TERM GOALS:    LTG Name Target Date Goal status  1 Patient will stand for 2 hours without self reported pain in order to perform surgery  Baseline: 05/11/2021 Ongoing   2 Patient will ambulate community distances without pain in order to perform IADL's  Baseline: 05/11/2021 Ongoing   3 Patient will go up/down 12 steps without pain in order to get around in her house Baseline: 05/11/2021 Ongoing   PLAN: PT FREQUENCY: 1x/week   PT DURATION: 6 weeks   PLANNED INTERVENTIONS: Therapeutic exercises, Therapeutic activity, Neuro Muscular re-education, Balance training, Gait training, Patient/Family education, Joint mobilization, Stair training, Aquatic Therapy, Dry Needling, Cryotherapy, Moist heat, Taping, Ultrasound, Ionotophoresis 4mg /ml Dexamethasone, and Manual therapy  PLAN FOR NEXT SESSION: Assess big toe mobility; consider needling to plantar fascia; continue with work on gastroc; add standing weight shift. Give patient quad set and SLR for the right knee; add low march for the knee and PF.      Carney Living PT DPT  04/15/2021, 7:56 AM

## 2021-04-15 ENCOUNTER — Encounter (HOSPITAL_BASED_OUTPATIENT_CLINIC_OR_DEPARTMENT_OTHER): Payer: Self-pay | Admitting: Physical Therapy

## 2021-04-16 ENCOUNTER — Encounter: Payer: 59 | Admitting: Gastroenterology

## 2021-04-20 ENCOUNTER — Other Ambulatory Visit: Payer: Self-pay

## 2021-04-20 ENCOUNTER — Encounter (HOSPITAL_BASED_OUTPATIENT_CLINIC_OR_DEPARTMENT_OTHER): Payer: Self-pay | Admitting: Physical Therapy

## 2021-04-20 ENCOUNTER — Ambulatory Visit (HOSPITAL_BASED_OUTPATIENT_CLINIC_OR_DEPARTMENT_OTHER): Payer: 59 | Attending: Orthopaedic Surgery | Admitting: Physical Therapy

## 2021-04-20 DIAGNOSIS — R2689 Other abnormalities of gait and mobility: Secondary | ICD-10-CM | POA: Diagnosis not present

## 2021-04-20 DIAGNOSIS — M25572 Pain in left ankle and joints of left foot: Secondary | ICD-10-CM | POA: Insufficient documentation

## 2021-04-20 NOTE — Therapy (Signed)
OUTPATIENT PHYSICAL THERAPY TREATMENT NOTE   Patient Name: Kodi Guerrera MRN: [id number] DOB:1971-01-08, 51 y.o., female Today's Date: 04/20/2021  PCP: [name], NP REFERRING PROVIDER: [name], NP   PT End of Session - 04/20/21 1333     Visit Number 5    Number of Visits 12    Date for PT Re-Evaluation 05/11/21    PT Start Time 7341    PT Stop Time 9379    PT Time Calculation (min) 40 min    Activity Tolerance Patient tolerated treatment well    Behavior During Therapy [facility] for tasks assessed/performed                Past Medical History:  Diagnosis Date   Back pain    GERD (gastroesophageal reflux disease)    with certain foods/certain times of day   Infertility, female    Joint pain    Menorrhagia with regular cycle 09/25/2016   Obesity, Class II, BMI 35-39.9 04/02/2015   Plantar fascia syndrome    Pre-diabetes    Thyroid disease    thyroid nodules-bx   Past Surgical History:  Procedure Laterality Date   BREAST BIOPSY Left 08/2017   HYSTEROSCOPY  2012   ivf   2012   KNEE ARTHROSCOPY Right 2006   Patient Active Problem List   Diagnosis Date Noted   Encounter to establish care 08/04/2020   Encounter for physical examination 08/04/2020   Hemorrhoids 08/04/2020   Body mass index (BMI) of 32.0-32.9 in adult 08/04/2020   Anxiety 09/23/2018   Thyroid nodule 01/24/2016   Vitamin D deficiency 01/24/2016   PCP: [name], NP   REFERRING PROVIDER: Mcarthur Rossetti*   REFERRING DIAG:    THERAPY DIAG:  Left plantar fascitis    ONSET DATE: August 2022    SUBJECTIVE:    SUBJECTIVE STATEMENT: Frequent cramping that pulls foot into plantar flexion. IASTM to calf has helped.   PERTINENT HISTORY: Right knee pain; upper back pain      PAIN:  Are you having pain? Yes  VAS scale:  5/10 Pain location: medial plantar facia  Pain orientation: Left  PAIN TYPE: aching Pain description:  intermittent: no pain at night   Aggravating factors: standing  Relieving factors: IASTM   PRECAUTIONS: None   WEIGHT BEARING RESTRICTIONS No   FALLS:  Has patient fallen in last 6 months? No,    LIVING ENVIRONMENT: Lives with: lives with their family Lives in: House/apartment Stairs: Yes;  Has following equipment at home: None   OCCUPATION: Solicitor    PLOF: Independent   PATIENT GOALS   To have less pain      OBJECTIVE:    GAIT: 2/3: lateral heel strike with significant wear on shoes    TODAY'S TREATMENT: 2/3 Manual: TPDN with skilled palpation and monitoring to Flexor Digitorum Brevis followed by STM; IASTM lateral gastroc paired with stretching Self STM with tennis ball Discussed current exercises & effect on pain  1/29 Manual: trigger point release to medial calf; light stretching of the mid belly and insertion of the plantar fascia; IASTYM to medial plantar facia.  Taping: PF saddle taping medial; fat pad taping; had patient do the taping on her own today. Therapy reviewed how to perform.   1/20 Manual: trigger point release to medial calf; light stretching of the mid belly and insertion of the plantar fascia; IASTYM to medial plantar facia.  Taping: PF saddle taping medial; fat pad taping  PF  x20 Blue  Inversion blue band x20  Heel raise x20  Reviewed slow march with single leg stability. Reviewed the reasoning behind the exercises and how to practise at home.    03/30/2021 Manual: trigger point release to medial calf; light stretching of the mid belly and insertion of the plantar fascia; IASTYM to medial plantar facia.  DF blue band x20  PF x20  Inversion blue band x20  Heel raise x20    Eval  Manual: trigger point release to medial calf; light stretching of the mid belly of the plantar fascia;      Program Notes using a butter knife handle go up the medial gastroc    move your fingers across the plantar facia      Exercises Seated Ankle Plantar Flexion with  Resistance Loop - 2 x daily - 7 x weekly - 3 sets - 10 reps Seated Ankle Inversion with Resistance - 2 x daily - 7 x weekly - 3 sets - 10 reps     PATIENT EDUCATION:  Education details: anatomy of condition, shoes, gait pattern, spurs & contributions Person educated: Patient Education method: Explanation demonstration handout  Education comprehension: verbalized understanding, returned demonstration, verbal cues required, tactile cues required, and needs further education     HOME EXERCISE PROGRAM: Access Code: [email or web address]    Program Notes using a butter knife handle go up the medial gastroc    move your fingers across the plantar facia    ASSESSMENT:   CLINICAL IMPRESSION: Spurring on plantar calcaneus & at distal achilles with point tenderness at both areas. DN decreased foot pain but cont to have point tenderness at bony site. Shoe wear on lateral heel indicating guarding by lateral gastroc leading to spasm. May consider DN to gastroc.    Impairments: Abnormal gait, decreased activity tolerance, decreased endurance, decreased mobility, difficulty walking, decreased strength, increased fascial restrictions, and pain   Activity limitations: community activity, occupation, yard work, and Counselling psychologist and 1 comorbidity: right knee pain.    REHAB POTENTIAL: Excellent   CLINICAL DECISION MAKING: Stable/uncomplicated   EVALUATION COMPLEXITY: Low     GOALS: Goals reviewed with patient? Yes   SHORT TERM GOALS:   STG Name Target Date Goal status 1/29  1 Patient will demonstrate full ankle ROM without pain  Baseline:  03/20/2021 Progressing DF with pain at end range   2 Patient will demonstrate 5/5 strength in her left ankle  Baseline:  03/20/2021 Progressing strengthening   3 Patient will be independent with basic stretching and strengthening program  Baseline: 03/20/2021 Ongoing   LONG TERM GOALS:    LTG Name Target Date Goal status  1 Patient will stand for 2 hours  without self reported pain in order to perform surgery  Baseline: 05/11/2021 Ongoing   2 Patient will ambulate community distances without pain in order to perform IADL's  Baseline: 05/11/2021 Ongoing   3 Patient will go up/down 12 steps without pain in order to get around in her house Baseline: 05/11/2021 Ongoing   PLAN: PT FREQUENCY: 1x/week   PT DURATION: 6 weeks   PLANNED INTERVENTIONS: Therapeutic exercises, Therapeutic activity, Neuro Muscular re-education, Balance training, Gait training, Patient/Family education, Joint mobilization, Stair training, Aquatic Therapy, Dry Needling, Cryotherapy, Moist heat, Taping, Ultrasound, Ionotophoresis 4mg /ml Dexamethasone, and Manual therapy   PLAN FOR NEXT SESSION: Assess big toe mobility; consider needling to plantar fascia; continue with work on gastroc; add standing weight shift. Give patient quad set and SLR for the right  knee; add low march for the knee and PF. consider possible ASO?     Selinda Eon PT DPT  04/20/2021, 2:25 PM

## 2021-04-25 ENCOUNTER — Telehealth: Payer: Self-pay | Admitting: Gastroenterology

## 2021-04-25 NOTE — Telephone Encounter (Signed)
Hey Dr. Tarri Glenn,   Patient called in to cancel procedure 2/10. 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 Patient rescheduled for 3/24.  Thank you

## 2021-04-27 ENCOUNTER — Encounter: Payer: 59 | Admitting: Gastroenterology

## 2021-04-27 DIAGNOSIS — Z1231 Encounter for screening mammogram for malignant neoplasm of breast: Secondary | ICD-10-CM | POA: Diagnosis not present

## 2021-05-17 DIAGNOSIS — R635 Abnormal weight gain: Secondary | ICD-10-CM | POA: Diagnosis not present

## 2021-06-03 ENCOUNTER — Encounter: Payer: Self-pay | Admitting: Certified Registered Nurse Anesthetist

## 2021-06-05 ENCOUNTER — Encounter: Payer: Self-pay | Admitting: Gastroenterology

## 2021-06-08 ENCOUNTER — Encounter: Payer: Self-pay | Admitting: Gastroenterology

## 2021-06-08 ENCOUNTER — Ambulatory Visit (AMBULATORY_SURGERY_CENTER): Payer: 59 | Admitting: Gastroenterology

## 2021-06-08 VITALS — BP 107/58 | HR 76 | Temp 97.5°F | Resp 18 | Ht 70.0 in | Wt 215.0 lb

## 2021-06-08 DIAGNOSIS — D122 Benign neoplasm of ascending colon: Secondary | ICD-10-CM | POA: Diagnosis not present

## 2021-06-08 DIAGNOSIS — Z1211 Encounter for screening for malignant neoplasm of colon: Secondary | ICD-10-CM | POA: Diagnosis not present

## 2021-06-08 DIAGNOSIS — K219 Gastro-esophageal reflux disease without esophagitis: Secondary | ICD-10-CM | POA: Diagnosis not present

## 2021-06-08 DIAGNOSIS — K635 Polyp of colon: Secondary | ICD-10-CM

## 2021-06-08 MED ORDER — SODIUM CHLORIDE 0.9 % IV SOLN: 500.0000 mL | Freq: Once | INTRAVENOUS | Status: DC

## 2021-06-08 NOTE — Op Note (Signed)
[place] ?Patient Name: Cassie [name] ?Procedure Date: 06/08/2021 3:59 PM ?MRN: [id number] ?Endoscopist: Thornton Park MD, MD ?Age: 51 ?Referring MD:  ?Date of Birth: 12/07/1970 ?Gender: Female ?Account #: [id number] ?Procedure:                Colonoscopy ?Indications:              Screening for colorectal malignant neoplasm, This  ?                          is the patient's first colonoscopy ?                          No known family history of colon cancer or polyps ?Medicines:                Monitored Anesthesia Care ?Procedure:                Pre-Anesthesia Assessment: ?                          - Prior to the procedure, a History and Physical  ?                          was performed, and patient medications and  ?                          allergies were reviewed. The patient's tolerance of  ?                          previous anesthesia was also reviewed. The risks  ?                          and benefits of the procedure and the sedation  ?                          options and risks were discussed with the patient.  ?                          All questions were answered, and informed consent  ?                          was obtained. Prior Anticoagulants: The patient has  ?                          taken no previous anticoagulant or antiplatelet  ?                          agents. ASA Grade Assessment: I - A normal, healthy  ?                          patient. After reviewing the risks and benefits,  ?                          the patient was deemed in satisfactory condition to  ?  undergo the procedure. ?                          After obtaining informed consent, the colonoscope  ?                          was passed under direct vision. Throughout the  ?                          procedure, the patient's blood pressure, pulse, and  ?                          oxygen saturations were monitored continuously. The  ?                          Olympus scope [id number] was  introduced through the  ?                          anus and advanced to the 3 cm into the ileum. A  ?                          second forward view of the right colon was  ?                          performed. The colonoscopy was performed without  ?                          difficulty. The patient tolerated the procedure  ?                          well. The quality of the bowel preparation was  ?                          excellent. The terminal ileum, ileocecal valve,  ?                          appendiceal orifice, and rectum were photographed. ?Scope In: 4:16:31 PM ?Scope Out: 4:30:26 PM ?Scope Withdrawal Time: 0 hours 10 minutes 32 seconds  ?Total Procedure Duration: 0 hours 13 minutes 55 seconds  ?Findings:                 The perianal and digital rectal examinations were  ?                          normal. ?                          A 6 mm possible polyp was found in the distal  ?                          ascending colon. The polyp was flat. The polyp was  ?                          removed with a cold snare. Resection and retrieval  ?  were complete. Estimated blood loss was minimal. ?                          The exam was otherwise without abnormality on  ?                          direct and retroflexion views. ?Complications:            No immediate complications. ?Estimated Blood Loss:     Estimated blood loss was minimal. ?Impression:               - One 6 mm possible polyp in the distal ascending  ?                          colon, removed with a cold snare. Resected and  ?                          retrieved. ?                          - The examination was otherwise normal on direct  ?                          and retroflexion views. ?Recommendation:           - Patient has a contact number available for  ?                          emergencies. The signs and symptoms of potential  ?                          delayed complications were discussed with the  ?                           patient. Return to normal activities tomorrow.  ?                          Written discharge instructions were provided to the  ?                          patient. ?                          - Resume previous diet. ?                          - Continue present medications. ?                          - Await pathology results. ?                          - Repeat colonoscopy date to be determined after  ?                          pending pathology results are reviewed for  ?  surveillance. ?                          - Emerging evidence supports eating a diet of  ?                          fruits, vegetables, grains, calcium, and yogurt  ?                          while reducing red meat and alcohol may reduce the  ?                          risk of colon cancer. ?                          - Thank you for allowing me to be involved in your  ?                          colon cancer prevention. ?Thornton Park MD, MD ?06/08/2021 4:39:16 PM ?This report has been signed electronically. ?

## 2021-06-08 NOTE — Patient Instructions (Signed)
Thank you for coming in to see [place] today! ?Results available in 1-2 weeks. ?Resume diet today, normal activities tomorrow. ? ? ?YOU HAD AN ENDOSCOPIC PROCEDURE TODAY AT [facility]:   Refer to the procedure report that was given to you for any specific questions about what was found during the examination.  If the procedure report does not answer your questions, please call your gastroenterologist to clarify.  If you requested that your care partner not be given the details of your procedure findings, then the procedure report has been included in a sealed envelope for you to review at your convenience later. ? ?YOU SHOULD EXPECT: Some feelings of bloating in the abdomen. Passage of more gas than usual.  Walking can help get rid of the air that was put into your GI tract during the procedure and reduce the bloating. If you had a lower endoscopy (such as a colonoscopy or flexible sigmoidoscopy) you may notice spotting of blood in your stool or on the toilet paper. If you underwent a bowel prep for your procedure, you may not have a normal bowel movement for a few days. ? ?Please Note:  You might notice some irritation and congestion in your nose or some drainage.  This is from the oxygen used during your procedure.  There is no need for concern and it should clear up in a day or so. ? ?SYMPTOMS TO REPORT IMMEDIATELY: ? ?Following lower endoscopy (colonoscopy or flexible sigmoidoscopy): ? Excessive amounts of blood in the stool ? Significant tenderness or worsening of abdominal pains ? Swelling of the abdomen that is new, acute ? Fever of 100?F or higher ? ? ? ?For urgent or emergent issues, a gastroenterologist can be reached at any hour by calling [phone]. ?Do not use MyChart messaging for urgent concerns.  ? ? ?DIET:  We do recommend a small meal at first, but then you may proceed to your regular diet.  Drink plenty of fluids but you should avoid alcoholic beverages for 24 hours. ? ?ACTIVITY:   You should plan to take it easy for the rest of today and you should NOT DRIVE or use heavy machinery until tomorrow (because of the sedation medicines used during the test).   ? ?FOLLOW UP: ?Our staff will call the number listed on your records 48-72 hours following your procedure to check on you and address any questions or concerns that you may have regarding the information given to you following your procedure. If we do not reach you, we will leave a message.  We will attempt to reach you two times.  During this call, we will ask if you have developed any symptoms of COVID 19. If you develop any symptoms (ie: fever, flu-like symptoms, shortness of breath, cough etc.) before then, please call [phone].  If you test positive for Covid 19 in the 2 weeks post procedure, please call and report this information to [place].   ? ?If any biopsies were taken you will be contacted by phone or by letter within the next 1-3 weeks.  Please call us at [phone] if you have not heard about the biopsies in 3 weeks.  ? ? ?SIGNATURES/CONFIDENTIALITY: ?You and/or your care partner have signed paperwork which will be entered into your electronic medical record.  These signatures attest to the fact that that the information above on your After Visit Summary has been reviewed and is understood.  Full responsibility of the confidentiality of this discharge information lies with  you and/or your care-partner.  ?

## 2021-06-08 NOTE — Progress Notes (Signed)
? ?  Referring Provider: [name], NP ?Primary Care Physician:  [name], NP ? ?Reason for Procedure:  Colon cancer screening ? ? ?IMPRESSION:  ?Need for colon cancer screening ?Appropriate candidate for monitored anesthesia care ? ?PLAN: ?Colonoscopy in the [place] today ? ? ?HPI: Cassie [name] is a 51 y.o. female presents for screening colonoscopy. ? ?No prior colonoscopy or colon cancer screening. ? ?No baseline GI symptoms.  ? ?No known family history of colon cancer or polyps. No family history of uterine/endometrial cancer, pancreatic cancer or gastric/stomach cancer. ? ? ?Past Medical History:  ?Diagnosis Date  ? Back pain   ? GERD (gastroesophageal reflux disease)   ? with certain foods/certain times of day  ? Infertility, female   ? Joint pain   ? Menorrhagia with regular cycle 09/25/2016  ? Obesity, Class II, BMI 35-39.9 04/02/2015  ? Plantar fascia syndrome   ? Pre-diabetes   ? Thyroid disease   ? thyroid nodules-bx  ? ? ?Past Surgical History:  ?Procedure Laterality Date  ? BREAST BIOPSY Left 08/2017  ? HYSTEROSCOPY  2012  ? ivf   2012  ? KNEE ARTHROSCOPY Right 2006  ? ? ?Current Outpatient Medications  ?Medication Sig Dispense Refill  ? meloxicam (MOBIC) 15 MG tablet Take 1 tablet (15 mg total) by mouth daily. (Patient not taking: Reported on 06/08/2021) 30 tablet 0  ? ?Current Facility-Administered Medications  ?Medication Dose Route Frequency Provider Last Rate Last Admin  ? 0.9 %  sodium chloride infusion  500 mL Intravenous Once Thornton Park, MD      ? ? ?Allergies as of 06/08/2021  ? (No Known Allergies)  ? ? ?Family History  ?Problem Relation Age of Onset  ? Cancer Mother   ? High Cholesterol Mother   ? Diabetes Father   ? Hypertension Father   ? Cancer Father   ? Kidney disease Father   ? Cancer Maternal Aunt   ? Colon cancer Neg Hx   ? Colon polyps Neg Hx   ? Esophageal cancer Neg Hx   ? Rectal cancer Neg Hx   ? Stomach cancer Neg Hx   ? ? ? ?Physical Exam: ?General:   Alert,   well-nourished, pleasant and cooperative in NAD ?Head:  Normocephalic and atraumatic. ?Eyes:  Sclera clear, no icterus.   Conjunctiva pink. ?Mouth:  No deformity or lesions.   ?Neck:  Supple; no masses or thyromegaly. ?Lungs:  Clear throughout to auscultation.   No wheezes. ?Heart:  Regular rate and rhythm; no murmurs. ?Abdomen:  Soft, non-tender, nondistended, normal bowel sounds, no rebound or guarding.  ?Msk:  Symmetrical. No boney deformities ?LAD: No inguinal or umbilical LAD ?Extremities:  No clubbing or edema. ?Neurologic:  Alert and  oriented x4;  grossly nonfocal ?Skin:  No obvious rash or bruise. ?Psych:  Alert and cooperative. Normal mood and affect. ? ? ? ? ?Studies/Results: ?No results found. ? ? ? ?Dawnell Bryant L. Tarri Glenn, MD, MPH ?06/08/2021, 4:05 PM ? ? ? ?  ?

## 2021-06-08 NOTE — Progress Notes (Signed)
Report given to PACU, vss 

## 2021-06-08 NOTE — Progress Notes (Signed)
Called to room to assist during endoscopic procedure.  Patient ID and intended procedure confirmed with present staff. Received instructions for my participation in the procedure from the performing physician.  

## 2021-06-12 ENCOUNTER — Telehealth: Payer: Self-pay

## 2021-06-12 NOTE — Telephone Encounter (Signed)
Left message on follow up call. 

## 2021-06-12 NOTE — Telephone Encounter (Signed)
No answer, left message to call back later today, B.Noble Cicalese RN. 

## 2021-06-14 ENCOUNTER — Encounter: Payer: Self-pay | Admitting: Gastroenterology

## 2021-07-01 DIAGNOSIS — M25572 Pain in left ankle and joints of left foot: Secondary | ICD-10-CM | POA: Diagnosis not present

## 2021-07-02 ENCOUNTER — Other Ambulatory Visit: Payer: Self-pay

## 2021-07-02 DIAGNOSIS — M722 Plantar fascial fibromatosis: Secondary | ICD-10-CM

## 2021-07-02 DIAGNOSIS — M79672 Pain in left foot: Secondary | ICD-10-CM

## 2021-07-04 ENCOUNTER — Ambulatory Visit
Admission: RE | Admit: 2021-07-04 | Discharge: 2021-07-04 | Disposition: A | Discharge status: Home / Self Care | Payer: 59 | Source: Ambulatory Visit | Attending: Orthopaedic Surgery | Admitting: Orthopaedic Surgery

## 2021-07-04 DIAGNOSIS — M722 Plantar fascial fibromatosis: Secondary | ICD-10-CM | POA: Diagnosis not present

## 2021-07-04 DIAGNOSIS — M79672 Pain in left foot: Secondary | ICD-10-CM

## 2021-07-04 DIAGNOSIS — R600 Localized edema: Secondary | ICD-10-CM | POA: Diagnosis not present

## 2021-07-09 ENCOUNTER — Ambulatory Visit: Payer: 59 | Admitting: Physician Assistant

## 2021-07-09 DIAGNOSIS — M6702 Short Achilles tendon (acquired), left ankle: Secondary | ICD-10-CM | POA: Diagnosis not present

## 2021-07-20 DIAGNOSIS — M79671 Pain in right foot: Secondary | ICD-10-CM | POA: Diagnosis not present

## 2021-07-20 DIAGNOSIS — M722 Plantar fascial fibromatosis: Secondary | ICD-10-CM | POA: Diagnosis not present

## 2021-07-22 DIAGNOSIS — M722 Plantar fascial fibromatosis: Secondary | ICD-10-CM | POA: Insufficient documentation

## 2021-07-23 DIAGNOSIS — L858 Other specified epidermal thickening: Secondary | ICD-10-CM | POA: Diagnosis not present

## 2021-07-23 DIAGNOSIS — D225 Melanocytic nevi of trunk: Secondary | ICD-10-CM | POA: Diagnosis not present

## 2021-07-23 DIAGNOSIS — D485 Neoplasm of uncertain behavior of skin: Secondary | ICD-10-CM | POA: Diagnosis not present

## 2021-07-23 DIAGNOSIS — D1801 Hemangioma of skin and subcutaneous tissue: Secondary | ICD-10-CM | POA: Diagnosis not present

## 2021-07-23 DIAGNOSIS — L820 Inflamed seborrheic keratosis: Secondary | ICD-10-CM | POA: Diagnosis not present

## 2021-07-23 DIAGNOSIS — L814 Other melanin hyperpigmentation: Secondary | ICD-10-CM | POA: Diagnosis not present

## 2021-07-23 DIAGNOSIS — L659 Nonscarring hair loss, unspecified: Secondary | ICD-10-CM | POA: Diagnosis not present

## 2021-07-23 DIAGNOSIS — L570 Actinic keratosis: Secondary | ICD-10-CM | POA: Diagnosis not present

## 2021-07-23 DIAGNOSIS — L821 Other seborrheic keratosis: Secondary | ICD-10-CM | POA: Diagnosis not present

## 2021-07-24 DIAGNOSIS — M722 Plantar fascial fibromatosis: Secondary | ICD-10-CM | POA: Diagnosis not present

## 2021-07-24 DIAGNOSIS — R635 Abnormal weight gain: Secondary | ICD-10-CM | POA: Diagnosis not present

## 2021-07-24 DIAGNOSIS — E669 Obesity, unspecified: Secondary | ICD-10-CM | POA: Diagnosis not present

## 2021-07-24 DIAGNOSIS — F54 Psychological and behavioral factors associated with disorders or diseases classified elsewhere: Secondary | ICD-10-CM | POA: Diagnosis not present

## 2021-07-24 DIAGNOSIS — Z6835 Body mass index (BMI) 35.0-35.9, adult: Secondary | ICD-10-CM | POA: Diagnosis not present

## 2021-08-03 DIAGNOSIS — M722 Plantar fascial fibromatosis: Secondary | ICD-10-CM | POA: Diagnosis not present

## 2021-08-03 DIAGNOSIS — M79671 Pain in right foot: Secondary | ICD-10-CM | POA: Diagnosis not present

## 2021-08-16 DIAGNOSIS — Z713 Dietary counseling and surveillance: Secondary | ICD-10-CM | POA: Diagnosis not present

## 2021-08-16 DIAGNOSIS — E669 Obesity, unspecified: Secondary | ICD-10-CM | POA: Diagnosis not present

## 2021-08-31 DIAGNOSIS — M722 Plantar fascial fibromatosis: Secondary | ICD-10-CM | POA: Diagnosis not present

## 2021-08-31 DIAGNOSIS — M6702 Short Achilles tendon (acquired), left ankle: Secondary | ICD-10-CM | POA: Diagnosis not present

## 2021-10-24 ENCOUNTER — Encounter (INDEPENDENT_AMBULATORY_CARE_PROVIDER_SITE_OTHER): Payer: Self-pay

## 2021-11-22 ENCOUNTER — Ambulatory Visit (HOSPITAL_BASED_OUTPATIENT_CLINIC_OR_DEPARTMENT_OTHER): Payer: 59 | Admitting: Radiology

## 2021-11-22 ENCOUNTER — Other Ambulatory Visit (HOSPITAL_BASED_OUTPATIENT_CLINIC_OR_DEPARTMENT_OTHER): Payer: Self-pay | Admitting: Nurse Practitioner

## 2021-11-22 ENCOUNTER — Ambulatory Visit (INDEPENDENT_AMBULATORY_CARE_PROVIDER_SITE_OTHER): Payer: 59

## 2021-11-22 DIAGNOSIS — R059 Cough, unspecified: Secondary | ICD-10-CM | POA: Diagnosis not present

## 2021-11-22 DIAGNOSIS — U099 Post covid-19 condition, unspecified: Secondary | ICD-10-CM

## 2021-11-22 DIAGNOSIS — R053 Chronic cough: Secondary | ICD-10-CM | POA: Insufficient documentation

## 2021-11-22 NOTE — Progress Notes (Unsigned)
  [name], DNP, AGNP-c [facility] [address]  [name], Cassie [address] [phone]  Subjective:   Cassie [name] is a 51 y.o. female presents to day for evaluation of: No chief complaint on file.  1. Post-COVID chronic cough Vinnie Level tells me that she has been experiencing symptoms of a productive cough since having COVID about 3 weeks ago. She endorses feeling well otherwise with no known fevers. She reports sudden coughing spells that are wet sounding and productive with clear to yellow colored sputum. She tells me her lungs do not feel rhonchus and she is not having any shortness of breath or CP. She has consistently been taking mucinex with no significant changes.   PMH, Medications, and Allergies reviewed and updated in chart as appropriate.   ROS negative except for what is listed in HPI. Objective:  There were no vitals taken for this visit. Physical Exam Constitutional:      Appearance: Normal appearance.  HENT:     Head: Normocephalic.  Eyes:     Extraocular Movements: Extraocular movements intact.     Pupils: Pupils are equal, round, and reactive to light.  Pulmonary:     Effort: Pulmonary effort is normal.     Breath sounds: Normal air entry. Examination of the right-middle field reveals decreased breath sounds. Examination of the right-lower field reveals decreased breath sounds. Examination of the left-lower field reveals decreased breath sounds. Decreased breath sounds present. No wheezing, rhonchi or rales.     Comments: Airflow sounds decreased and "turbulent" in nature in the right middle and lower lobes and left lower lobe with no rhonchus noted.  Skin:    General: Skin is warm and dry.  Neurological:     General: No focal deficit present.     Mental Status: She is alert and oriented to person, place, and time.  Psychiatric:        Mood and Affect: Mood normal.        Behavior: Behavior normal.         Thought Content: Thought content normal.        Judgment: Judgment normal.           Assessment & Plan:   Problem List Items Addressed This Visit   None Visit Diagnoses     Post-COVID chronic cough    -  Primary   Relevant Orders   DG Chest 2 View      Productive cough for three weeks following COVID infection. Her lung sounds are turbulent sounding with no rhonchi noted, but this is unusual. Given her symptoms I do feel that a CXR is warranted. She is not having any obvious signs of infection at this time, which is reassuring. We will plan to follow-up and make changes to plan of care as appropriate based on imaging study.    [name], DNP, AGNP-c 11/22/2021  8:05 AM    History, Medications, Surgery, SDOH, and Family History reviewed and updated as appropriate.

## 2021-11-23 ENCOUNTER — Other Ambulatory Visit (HOSPITAL_BASED_OUTPATIENT_CLINIC_OR_DEPARTMENT_OTHER): Payer: Self-pay

## 2021-11-23 ENCOUNTER — Other Ambulatory Visit (HOSPITAL_BASED_OUTPATIENT_CLINIC_OR_DEPARTMENT_OTHER): Payer: Self-pay | Admitting: Nurse Practitioner

## 2021-11-23 DIAGNOSIS — R053 Chronic cough: Secondary | ICD-10-CM

## 2021-11-23 MED ORDER — AZITHROMYCIN 250 MG PO TABS: ORAL_TABLET | ORAL | 0 refills | Status: AC

## 2021-11-23 MED ORDER — BENZONATATE 200 MG PO CAPS
200.0000 mg | ORAL_CAPSULE | Freq: Three times a day (TID) | ORAL | 0 refills | Status: DC | PRN
  Filled 2021-11-23: qty 30, 10d supply, fill #0

## 2021-11-23 MED ORDER — ALBUTEROL SULFATE HFA 108 (90 BASE) MCG/ACT IN AERS
INHALATION_SPRAY | RESPIRATORY_TRACT | 0 refills | Status: DC
  Filled 2021-11-23: qty 6.7, 17d supply, fill #0

## 2021-12-01 ENCOUNTER — Other Ambulatory Visit (HOSPITAL_BASED_OUTPATIENT_CLINIC_OR_DEPARTMENT_OTHER): Payer: Self-pay | Admitting: Nurse Practitioner

## 2021-12-01 ENCOUNTER — Encounter (HOSPITAL_BASED_OUTPATIENT_CLINIC_OR_DEPARTMENT_OTHER): Payer: Self-pay | Admitting: Nurse Practitioner

## 2021-12-01 ENCOUNTER — Other Ambulatory Visit (HOSPITAL_COMMUNITY): Payer: Self-pay

## 2021-12-01 DIAGNOSIS — U099 Post covid-19 condition, unspecified: Secondary | ICD-10-CM

## 2021-12-01 MED ORDER — AZITHROMYCIN 250 MG PO TABS
ORAL_TABLET | ORAL | 0 refills | Status: AC
  Filled 2021-12-01: qty 6, 5d supply, fill #0

## 2021-12-01 MED ORDER — TRELEGY ELLIPTA 100-62.5-25 MCG/ACT IN AEPB
1.0000 | INHALATION_SPRAY | Freq: Every day | RESPIRATORY_TRACT | 11 refills | Status: DC
  Filled 2021-12-01: qty 60, 30d supply, fill #0

## 2021-12-01 MED ORDER — PREDNISONE 20 MG PO TABS
40.0000 mg | ORAL_TABLET | Freq: Every day | ORAL | 0 refills | Status: DC
  Filled 2021-12-01: qty 10, 5d supply, fill #0

## 2021-12-01 MED ORDER — TRELEGY ELLIPTA 200-62.5-25 MCG/ACT IN AEPB
INHALATION_SPRAY | RESPIRATORY_TRACT | 2 refills | Status: DC
  Filled 2021-12-01: qty 60, 30d supply, fill #0

## 2021-12-19 NOTE — Progress Notes (Unsigned)
Synopsis: Referred in 12/2021 for post COVID chronic cough  Subjective:   PATIENT ID: Cassie [name] GENDER: female DOB: 17-Jan-1971, MRN: [id number]   HPI  No chief complaint on file.   *** Record review: 9/7 record from [facility] reviewed where the patient was seen for post-COVID cough.  Supportive care was offered and a chest x-ray was performed  Past Medical History:  Diagnosis Date   Back pain    GERD (gastroesophageal reflux disease)    with certain foods/certain times of day   Infertility, female    Joint pain    Menorrhagia with regular cycle 09/25/2016   Obesity, Class II, BMI 35-39.9 04/02/2015   Plantar fascia syndrome    Pre-diabetes    Thyroid disease    thyroid nodules-bx     Family History  Problem Relation Age of Onset   Cancer Mother    High Cholesterol Mother    Diabetes Father    Hypertension Father    Cancer Father    Kidney disease Father    Cancer Maternal Aunt    Colon cancer Neg Hx    Colon polyps Neg Hx    Esophageal cancer Neg Hx    Rectal cancer Neg Hx    Stomach cancer Neg Hx      Social History   Socioeconomic History   Marital status: Married    Spouse name: Lurena Nida   Number of children: 1   Years of education: Not on file   Highest education level: Not on file  Occupational History   Occupation: MD, OB/GYN  Tobacco Use   Smoking status: Never   Smokeless tobacco: Never  Substance and Sexual Activity   Alcohol use: No    Alcohol/week: 0.0 standard drinks of alcohol   Drug use: No   Sexual activity: Yes    Partners: Male    Birth control/protection: Condom  Other Topics Concern   Not on file  Social History Narrative   Not on file   Social Determinants of Health   Financial Resource Strain: Not on file  Food Insecurity: Not on file  Transportation Needs: Not on file  Physical Activity: Sufficiently Active (08/04/2020)   Exercise Vital Sign    Days of Exercise per Week: 3 days     Minutes of Exercise per Session: 60 min  Stress: Not on file  Social Connections: Not on file  Intimate Partner Violence: Not on file     No Known Allergies   Outpatient Medications Prior to Visit  Medication Sig Dispense Refill   albuterol (VENTOLIN HFA) 108 (90 Base) MCG/ACT inhaler Use 2 puffs every 4 hours as needed 6.7 g 0   benzonatate (TESSALON) 200 MG capsule Take 1 capsule (200 mg total) by mouth 3 (three) times daily as needed. 30 capsule 0   Fluticasone-Umeclidin-Vilant (TRELEGY ELLIPTA) 200-62.5-25 MCG/ACT AEPB One inhalation daily. Rinse mouth well with water and spit out after using. 28 each 2   predniSONE (DELTASONE) 20 MG tablet Take 2 tablets (40 mg total) by mouth daily with breakfast. 10 tablet 0   No facility-administered medications prior to visit.    ROS    Objective:  Physical Exam   There were no vitals filed for this visit.  ***  CBC    Component Value Date/Time   WBC 7.3 08/25/2020 0921   WBC 8.6 06/29/2014 1011   RBC 4.05 08/25/2020 0921   RBC 4.25 06/29/2014 1011   HGB 12.9 08/25/2020 0921  HCT 39.0 08/25/2020 0921   PLT 224 08/25/2020 0921   MCV 96 08/25/2020 0921   MCH 31.9 08/25/2020 0921   MCH 31.3 06/29/2014 1011   MCHC 33.1 08/25/2020 0921   MCHC 33.9 06/29/2014 1011   RDW 12.5 08/25/2020 0921   LYMPHSABS 3.1 08/25/2020 0921   MONOABS 0.7 06/29/2014 1011   EOSABS 0.1 08/25/2020 0921   BASOSABS 0.0 08/25/2020 6226     Chest imaging: November 22, 2021 two-view chest x-ray images independently reviewed showing normal pulmonary parenchyma, normal cardiac silhouette  PFT:  Labs:  Path:  Echo:  Heart Catheterization:       Assessment & Plan:   No diagnosis found.  Discussion: ***  Immunizations: Immunization History  Administered Date(s) Administered   Influenza-Unspecified 12/25/2017, 12/18/2018, 12/22/2019   PFIZER(Purple Top)SARS-COV-2 Vaccination 03/19/2019, 04/12/2019, 01/01/2020   Rho (D) Immune  Globulin 02/23/2012   Tdap 07/17/2011, 09/15/2017     Current Outpatient Medications:    albuterol (VENTOLIN HFA) 108 (90 Base) MCG/ACT inhaler, Use 2 puffs every 4 hours as needed, Disp: 6.7 g, Rfl: 0   benzonatate (TESSALON) 200 MG capsule, Take 1 capsule (200 mg total) by mouth 3 (three) times daily as needed., Disp: 30 capsule, Rfl: 0   Fluticasone-Umeclidin-Vilant (TRELEGY ELLIPTA) 200-62.5-25 MCG/ACT AEPB, One inhalation daily. Rinse mouth well with water and spit out after using., Disp: 28 each, Rfl: 2   predniSONE (DELTASONE) 20 MG tablet, Take 2 tablets (40 mg total) by mouth daily with breakfast., Disp: 10 tablet, Rfl: 0

## 2021-12-20 ENCOUNTER — Encounter: Payer: Self-pay | Admitting: Pulmonary Disease

## 2021-12-20 ENCOUNTER — Ambulatory Visit (INDEPENDENT_AMBULATORY_CARE_PROVIDER_SITE_OTHER): Payer: 59 | Admitting: Pulmonary Disease

## 2021-12-20 VITALS — BP 122/74 | HR 84

## 2021-12-20 DIAGNOSIS — R053 Chronic cough: Secondary | ICD-10-CM

## 2021-12-20 LAB — NITRIC OXIDE: Nitric Oxide: 11

## 2021-12-20 NOTE — Patient Instructions (Signed)
Chronic cough after COVID: Spirometry testing Exhaled nitric oxide testing You need to try to suppress your cough to allow your larynx (voice box) to heal.  For three days don't talk, laugh, sing, or clear your throat. Do everything you can to suppress the cough during this time. Use hard candies (sugarless Jolly Ranchers) or non-mint or non-menthol containing cough drops during this time to soothe your throat.  Use a cough suppressant (Delsym or what I have prescribed you) around the clock during this time.  After three days, gradually increase the use of your voice and back off on the cough suppressants.  We will see you back if the cough doesn't improve, don't hesitate to call

## 2022-02-14 NOTE — Progress Notes (Signed)
[facility] [place] Phone: [phone] Subjective:   Cassie [name], am serving as a scribe for Dr. Hulan Saas.  I'm seeing this patient by the request  of:  Early, Coralee Pesa, NP  CC: Low back pain  [id number]  Cassie [name] is a 51 y.o. female coming in with complaint of back pain. Patient states that 2 weeks ago she was cooking in the kitchen for 6 hours. Went to bed with a tight back and this seemed to get worse over the following 2 days. Pain in L SI joint especially with getting in and out of the car. Pain has improved. Has a vacation next Friday and she would like to make sure she can have some relief. Does not care to take any medication. Pain with L SLR and L hip flexion at 90/90 but not with lumbar flexion. Denies any radiating symptoms.        Past Medical History:  Diagnosis Date   Back pain    GERD (gastroesophageal reflux disease)    with certain foods/certain times of day   Infertility, female    Joint pain    Menorrhagia with regular cycle 09/25/2016   Obesity, Class II, BMI 35-39.9 04/02/2015   Plantar fascia syndrome    Pre-diabetes    Thyroid disease    thyroid nodules-bx   Past Surgical History:  Procedure Laterality Date   BREAST BIOPSY Left 08/2017   HYSTEROSCOPY  2012   ivf   2012   KNEE ARTHROSCOPY Right 2006   Social History   Socioeconomic History   Marital status: Married    Spouse name: Lurena Nida   Number of children: 1   Years of education: Not on file   Highest education level: Not on file  Occupational History   Occupation: MD, OB/GYN  Tobacco Use   Smoking status: Never    Passive exposure: Never   Smokeless tobacco: Never  Substance and Sexual Activity   Alcohol use: [name]    Alcohol/week: 0.0 standard drinks of alcohol   Drug use: [name]   Sexual activity: Yes    Partners: Male    Birth control/protection: Condom  Other Topics Concern   Not on file   Social History Narrative   Not on file   Social Determinants of Health   Financial Resource Strain: Not on file  Food Insecurity: Not on file  Transportation Needs: Not on file  Physical Activity: Sufficiently Active (08/04/2020)   Exercise Vital Sign    Days of Exercise per Week: 3 days    Minutes of Exercise per Session: 60 min  Stress: Not on file  Social Connections: Not on file   [name] Known Allergies Family History  Problem Relation Age of Onset   Cancer Mother    High Cholesterol Mother    Diabetes Father    Hypertension Father    Cancer Father    Kidney disease Father    Cancer Maternal Aunt    Colon cancer Neg Hx    Colon polyps Neg Hx    Esophageal cancer Neg Hx    Rectal cancer Neg Hx    Stomach cancer Neg Hx     Current Outpatient Medications (Endocrine & Metabolic):    predniSONE (DELTASONE) 20 MG tablet, Take 2 tablets (40 mg total) by mouth daily with breakfast for 5 days.   Current Outpatient Medications (Respiratory):    Fluticasone-Umeclidin-Vilant (TRELEGY ELLIPTA) 200-62.5-25 MCG/ACT AEPB, One inhalation  daily. Rinse mouth well with water and spit out after using. (Patient not taking: Reported on 12/20/2021)    Current Outpatient Medications (Other):    tiZANidine (ZANAFLEX) 2 MG tablet, Take 1 tablet (2 mg total) by mouth at bedtime.    Reviewed prior external information including notes and imaging from  primary care provider As well as notes that were available from care everywhere and other healthcare systems.  Patient was being seen by pulmonology in September for chronic cough  Past medical history, social, surgical and family history all reviewed in electronic medical record.  [name] pertanent information unless stated regarding to the chief complaint.   Review of Systems:  [name] headache, visual changes, nausea, vomiting, diarrhea, constipation, dizziness, abdominal pain, skin rash, fevers, chills, night sweats, weight loss, swollen lymph nodes,  body aches, joint swelling, chest pain, shortness of breath, mood changes. POSITIVE muscle aches  Objective  Blood pressure 114/72, pulse 69, height '5\' 10"'$  (1.778 m), SpO2 98 %.   General: [name] apparent distress alert and oriented x3 mood and affect normal, dressed appropriately.  HEENT: Pupils equal, extraocular movements intact  Respiratory: Patient's speak in full sentences and does not appear short of breath  Cardiovascular: [name] lower extremity edema, non tender, [name] erythema  Low back exam does have mild increase in pain with flexion and extension.  Pain over the sacroiliac joint positive Corky Sox on the left side.  Right side of the thoracolumbar juncture seems to be more more agitated.   Osteopathic findings T11 extended rotated and side bent left L2 flexed rotated and side bent right Sacrum left on left    Impression and Recommendations:    The above documentation has been reviewed and is accurate and complete Lyndal Pulley, DO

## 2022-02-15 ENCOUNTER — Other Ambulatory Visit (HOSPITAL_COMMUNITY): Payer: Self-pay

## 2022-02-15 ENCOUNTER — Encounter: Payer: Self-pay | Admitting: Family Medicine

## 2022-02-15 ENCOUNTER — Ambulatory Visit (INDEPENDENT_AMBULATORY_CARE_PROVIDER_SITE_OTHER): Payer: 59

## 2022-02-15 ENCOUNTER — Ambulatory Visit (INDEPENDENT_AMBULATORY_CARE_PROVIDER_SITE_OTHER): Payer: 59 | Admitting: Family Medicine

## 2022-02-15 VITALS — BP 114/72 | HR 69 | Ht 70.0 in

## 2022-02-15 DIAGNOSIS — M9904 Segmental and somatic dysfunction of sacral region: Secondary | ICD-10-CM | POA: Diagnosis not present

## 2022-02-15 DIAGNOSIS — M9902 Segmental and somatic dysfunction of thoracic region: Secondary | ICD-10-CM

## 2022-02-15 DIAGNOSIS — M545 Low back pain, unspecified: Secondary | ICD-10-CM

## 2022-02-15 DIAGNOSIS — M9903 Segmental and somatic dysfunction of lumbar region: Secondary | ICD-10-CM

## 2022-02-15 DIAGNOSIS — M533 Sacrococcygeal disorders, not elsewhere classified: Secondary | ICD-10-CM | POA: Diagnosis not present

## 2022-02-15 HISTORY — DX: Segmental and somatic dysfunction of sacral region: M99.04

## 2022-02-15 MED ORDER — TIZANIDINE HCL 2 MG PO TABS
2.0000 mg | ORAL_TABLET | Freq: Every day | ORAL | 0 refills | Status: DC
  Filled 2022-02-15 – 2022-02-20 (×2): qty 30, 30d supply, fill #0

## 2022-02-15 MED ORDER — PREDNISONE 20 MG PO TABS
40.0000 mg | ORAL_TABLET | Freq: Every day | ORAL | 0 refills | Status: AC
  Filled 2022-02-15 – 2022-02-20 (×2): qty 10, 5d supply, fill #0

## 2022-02-15 NOTE — Patient Instructions (Signed)
Xray lumbar SI jt exercises Buy some Pennsaid  See me again in 5-6 weeks

## 2022-02-15 NOTE — Assessment & Plan Note (Signed)
   Decision today to treat with OMT was based on Physical Exam  After verbal consent patient was treated with HVLA, ME, FPR techniques in   , thoracic,  lumbar and sacral areas, all areas are chronic   Patient tolerated the procedure well with improvement in symptoms  Patient given exercises, stretches and lifestyle modifications  See medications in patient instructions if given  Patient will follow up in 4-8 weeks

## 2022-02-15 NOTE — Assessment & Plan Note (Signed)
Sacroiliac dysfunction noted.  Patient did respond extremely well to osteopathic manipulations.  Still has some tightness noted.  No radicular symptoms.  Discussed with patient icing regimen and home exercises, which activities to do.  Patient willing to start with home exercises.  Given prednisone and Zanaflex.  Breakthrough pain with patient traveling.  Follow-up again in 6 to 8 weeks

## 2022-02-20 ENCOUNTER — Other Ambulatory Visit (HOSPITAL_BASED_OUTPATIENT_CLINIC_OR_DEPARTMENT_OTHER): Payer: Self-pay

## 2022-02-20 ENCOUNTER — Other Ambulatory Visit (HOSPITAL_COMMUNITY): Payer: Self-pay

## 2022-03-26 NOTE — Progress Notes (Signed)
  [name] Phone: [phone] Subjective:   Cassie [name], am serving as a scribe for Dr. Hulan Saas.  I'm seeing this patient by the request  of:  Early, Coralee Pesa, NP  CC: Low back pain follow-up  [id number]  Cassie [name] is a 52 y.o. female coming in with complaint of back and neck pain. OMT 02/15/2022. Patient states that she is in no pain at all. Not sure if she needs OMT but wanted to know if she should get today just to keep up or not. Patient never needed to take any medication that was given to her not even ibuprofen.   Medications patient has been prescribed: Zanaflex  Taking: none         Past Medical History:  Diagnosis Date   Back pain    GERD (gastroesophageal reflux disease)    with certain foods/certain times of day   Infertility, female    Joint pain    Menorrhagia with regular cycle 09/25/2016   Obesity, Class II, BMI 35-39.9 04/02/2015   Plantar fascia syndrome    Pre-diabetes    Thyroid disease    thyroid nodules-bx    Objective  Blood pressure 130/84, pulse 86, height '5\' 10"'$  (1.778 m), SpO2 99 %.   General: No apparent distress alert and oriented x3 mood and affect normal, dressed appropriately.  HEENT: Pupils equal, extraocular movements intact  Respiratory: Patient's speak in full sentences and does not appear short of breath  Cardiovascular: No lower extremity edema, non tender, no erythema  Patient has negative Corky Sox, negative straight leg test, can stand up without any significant difficulty.       Assessment and Plan:  SI (sacroiliac) joint dysfunction Patient is doing remarkably better with HEP    No significant discomfort.  We discussed with her to continue to work on core strength and follow-up with me as needed       The above documentation has been reviewed and is accurate and complete Lyndal Pulley, DO         Note: This  dictation was prepared with Dragon dictation along with smaller phrase technology. Any transcriptional errors that result from this process are unintentional.

## 2022-03-29 ENCOUNTER — Ambulatory Visit: Payer: Commercial Managed Care - PPO | Admitting: Family Medicine

## 2022-03-29 VITALS — BP 130/84 | HR 86 | Ht 70.0 in

## 2022-03-29 DIAGNOSIS — M533 Sacrococcygeal disorders, not elsewhere classified: Secondary | ICD-10-CM | POA: Diagnosis not present

## 2022-03-29 NOTE — Assessment & Plan Note (Signed)
Patient is doing remarkably better with HEP    No significant discomfort.  We discussed with her to continue to work on core strength and follow-up with me as needed

## 2022-03-29 NOTE — Patient Instructions (Signed)
Great to see you!   

## 2022-05-13 DIAGNOSIS — Z0289 Encounter for other administrative examinations: Secondary | ICD-10-CM

## 2022-05-21 ENCOUNTER — Ambulatory Visit (INDEPENDENT_AMBULATORY_CARE_PROVIDER_SITE_OTHER): Payer: Commercial Managed Care - PPO | Admitting: Family Medicine

## 2022-05-22 ENCOUNTER — Encounter (INDEPENDENT_AMBULATORY_CARE_PROVIDER_SITE_OTHER): Payer: Self-pay | Admitting: Family Medicine

## 2022-05-22 ENCOUNTER — Ambulatory Visit (INDEPENDENT_AMBULATORY_CARE_PROVIDER_SITE_OTHER): Payer: Commercial Managed Care - PPO | Admitting: Family Medicine

## 2022-05-22 VITALS — BP 123/81 | HR 80 | Temp 98.0°F | Ht 70.0 in | Wt 239.2 lb

## 2022-05-22 DIAGNOSIS — R5383 Other fatigue: Secondary | ICD-10-CM | POA: Diagnosis not present

## 2022-05-22 DIAGNOSIS — F5089 Other specified eating disorder: Secondary | ICD-10-CM

## 2022-05-22 DIAGNOSIS — R0602 Shortness of breath: Secondary | ICD-10-CM | POA: Diagnosis not present

## 2022-05-22 DIAGNOSIS — F509 Eating disorder, unspecified: Secondary | ICD-10-CM | POA: Insufficient documentation

## 2022-05-22 DIAGNOSIS — Z8639 Personal history of other endocrine, nutritional and metabolic disease: Secondary | ICD-10-CM | POA: Diagnosis not present

## 2022-05-22 DIAGNOSIS — R7303 Prediabetes: Secondary | ICD-10-CM | POA: Diagnosis not present

## 2022-05-22 DIAGNOSIS — E668 Other obesity: Secondary | ICD-10-CM | POA: Diagnosis not present

## 2022-05-22 DIAGNOSIS — Z6834 Body mass index (BMI) 34.0-34.9, adult: Secondary | ICD-10-CM

## 2022-05-22 DIAGNOSIS — E669 Obesity, unspecified: Secondary | ICD-10-CM

## 2022-05-22 HISTORY — DX: Eating disorder, unspecified: F50.9

## 2022-05-22 NOTE — Progress Notes (Signed)
Cassie [name], D.O.  ABFM, ABOM Specializing in Clinical Bariatric Medicine  Office located at: [address], [place]  [address]     Dear Cassie [name], Cassie Pesa, NP,   Thank you for referring Cassie [name] to our clinic. The following note includes my evaluation and treatment recommendations.   Chief Complaint:   OBESITY Cassie [name] (MR# [id number]) is a 52 y.o. female who presents for evaluation and treatment of obesity and related comorbidities. Current BMI is Body mass index is 34.32 kg/m. Cassie [name] has been struggling with her weight for many years and has been unsuccessful in either losing weight, maintaining weight loss, or reaching her healthy weight goal.  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 Patient is married to husband Cassie [name]  and has children (1 sonErasmo [name]). She lives with her husband and son.  Cassie [name] is currently in the action stage of change and ready to dedicate time achieving and maintaining a healthier weight. Cassie [name] is interested in becoming our patient and working on intensive lifestyle modifications including (but not limited to) diet and exercise for weight loss.  Cassie [name]'s habits were reviewed today and are as follows: her desired weight loss is 19lb- goal of 220lbs, she started gaining weight 10 years ago, her heaviest weight ever was current weight- 239 pounds, she snacks frequently in the evenings, and she skips meals frequently. She often misses lunch due to being busy at work. She reports that if she skips lunch she will have more nighttime cravings and snacking. She craves crackers at night and her typical snacks are crackers and popcorn. She states that eats out maybe once a week. She states that her biggest obstacle in healthy eating is time.  She typically drinks coffee with fairlife milk and no sweeteners.  Patient states that she typically plans and preps large meals for her family to eat for several meals.  She states that  she started gaining weight gradually during pregnancy at 55. She tried Optifast at that time but did not find it very helpful. She later tried Weight watchers and this program. She found our program most helpful and was last seen in clinic in 2022. She was primarily seen by Cassie [name]. She found the meal plan most beneficial to her as it removed the stress of coming up with meal ideas on her own.  She previously played tennis and basketball and ran track. She states that she would like to start exercising again.  She struggles with emotional eating- often eating when stressed, to stay awake, and when bored.  Patient has a history of follicular thyroid nodule and biopsy in 2016. She states that the nodule has been unchanged for several years. Her last thyroid US was in 02/2016. This is followed by her PCP NP Cassie [name].    She has a history of prediabetes. Her A1c was 5.4 on 08/25/2020. Her highest A1c was 5.7 in 05/2018.  Depression Screen Cassie [name] (modified PHQ-9) score was 6.  Sleep habits She  ESS score is 6.  She states that she sleeps 6-8 hours a night, but often less when she is on call. She is on home call 1 night a week and 1 weekend every 5 weeks.   Subjective:   This is the patient's first visit at [facility].  The patient's NEW PATIENT PACKET that they filled out prior to today's office visit was reviewed at length and information from that paperwork  was included within the following office visit note.    Included in the packet: current and past health history, medications, allergies, ROS, gynecologic history (women only), surgical history, family history, social history, weight history, weight loss surgery history (for those that have had weight loss surgery), nutritional evaluation, [name] and food questionnaire along with a depression screening (PHQ9) on all patients, an Epworth questionnaire, sleep habits questionnaire, patient life and health  improvement goals questionnaire. These will all be scanned into the patient's chart under media.   Review of Systems: Please refer to new patient packet scanned into media. Pertinent positives were addressed with patient today.  Objective:   Blood pressure 123/81, pulse 80, temperature 98 F (36.7 C), height '5\' 10"'$  (1.778 m), weight 239 lb 3.2 oz (108.5 kg), SpO2 98 %. Body mass index is 34.32 kg/m.  EKG: Normal sinus rhythm, rate 72bpm.  Indirect Calorimeter completed today shows a VO2 of 274 and a REE of 1886.  Her calculated basal metabolic rate is [id number] thus her measured basal metabolic rate of [id number] is better than expected.   Anthropometric Measurements Height: '5\' 10"'$  (1.778 m) Weight: 239 lb 3.2 oz (108.5 kg) BMI (Calculated): 34.32 Starting Weight: 239.2lb Peak Weight: 239.2lb Waist Measurement : 44 inches   Body Composition  Body Fat %: 44.2 % Fat Mass (lbs): 105.8 lbs Muscle Mass (lbs): 126.6 lbs Total Body Water (lbs): 92.6 lbs Visceral Fat Rating : 11   Other Clinical Data RMR: [id number] Fasting: yes Labs: yes Today's Visit #: 1 Starting Date: 05/22/22    General: Well Developed, well nourished, and in no acute distress.  HEENT: Normocephalic, atraumatic Skin: Warm and dry, cap RF less 2 sec, good turgor Chest:  Normal excursion, shape, no gross abn Respiratory: speaking in full sentences, no conversational dyspnea NeuroM-Sk: Ambulates w/o assistance, moves * 4 Psych: A and O *3, insight good, [name]-full   Lab Results  Component Value Date   CREATININE 0.72 11/09/2020   BUN 10 11/09/2020   NA 141 11/09/2020   K 4.4 11/09/2020   CL 104 11/09/2020   CO2 24 11/09/2020   Lab Results  Component Value Date   ALT 10 11/09/2020   AST 16 11/09/2020   ALKPHOS 73 11/09/2020   BILITOT 0.6 11/09/2020   Lab Results  Component Value Date   HGBA1C 5.4 08/25/2020   HGBA1C 5.4 02/24/2019   HGBA1C 5.7 (H) 05/20/2018   HGBA1C 5.4 09/25/2016   HGBA1C 5.8 (H)  06/29/2014   Lab Results  Component Value Date   INSULIN 5.2 11/09/2020   INSULIN 4.4 02/24/2019   INSULIN 5.8 05/20/2018   Lab Results  Component Value Date   TSH 0.669 08/25/2020   Lab Results  Component Value Date   CHOL 184 08/25/2020   HDL 58 08/25/2020   LDLCALC 115 (H) 08/25/2020   TRIG 55 08/25/2020   CHOLHDL 3.2 08/25/2020   Lab Results  Component Value Date   WBC 7.3 08/25/2020   HGB 12.9 08/25/2020   HCT 39.0 08/25/2020   MCV 96 08/25/2020   PLT 224 08/25/2020   Lab Results  Component Value Date   IRON 149 09/25/2016   TIBC 277 09/25/2016   FERRITIN 32 09/25/2016    Assessment and Plan:   During the visit, I independently reviewed the patient's EKG, bioimpedance scale results, and indirect calorimeter results. I used this information to tailor a meal plan for the patient that will help Cassie [name] to lose weight and will improve her  obesity-related conditions going forward.  I performed a medically necessary appropriate examination and/or evaluation. I discussed the assessment and treatment plan with the patient. The patient was provided an opportunity to ask questions and all were answered. The patient agreed with the plan and demonstrated an understanding of the instructions. Labs were ordered today (unless patient declined them) and will be reviewed with the patient at our next visit unless more critical results need to be addressed immediately. Clinical information was updated and documented in the EMR.  Time spent on visit including pre-visit chart review and post-visit care was estimated to be 60  minutes.  A separate 15 minutes was spent on risk counseling (see above/below).   Orders Placed This Encounter  Procedures   EKG 12-Lead    There are no discontinued medications.   No orders of the defined types were placed in this encounter.     TREATMENT PLAN FOR OBESITY:  Recommended Dietary Goals  Rozenia is currently in the action stage of  change. As such, her goal is to continue weight management plan. She has agreed to the Category 2 Plan.  Behavioral Intervention  We discussed the following Behavioral Modification Strategies today: increasing lean protein intake, decreasing simple carbohydrates , increasing vegetables, increasing lower glycemic fruits, increasing fiber rich foods, avoiding skipping meals, work on meal planning and easy cooking plans, and emotional eating strategies and understanding the difference between hunger signals and cravings.  Additional resources provided today: mindful eating habits, category 2 meal plan information, breakfast options, and lunch options  Recommended Physical Activity Goals  [place]lee has been advised to work up to 150 minutes of moderate intensity aerobic activity a week and strengthening exercises 2-3 times per week for cardiovascular health, weight loss maintenance and preservation of muscle mass.   She has agreed to continue physical activity as is.    ASSOCIATED CONDITIONS ADDRESSED TODAY   Other fatigue Assessment: Condition is Stable. She states that she feels more tired and short of breath if climbing 2 flights of stairs than she did prior to gaining weight.  Plan:Will start category 2 meal plan- provided with meal plan information and breakfast/lunch options. Advised to focus on meal plan at this time and maintain current exercise level.    SOB (shortness of breath) on exertion Assessment: Condition is Stable. She states that she feels more tired and short of breath if climbing 2 flights of stairs than she did prior to gaining weight.  Plan:Will start category 2 meal plan- provided with meal plan information and breakfast/lunch options. Advised to focus on meal plan at this time and maintain current exercise level.    Class 1 obesity with serious comorbidity and body mass index (BMI) of 34.0 to 34.9 in adult, unspecified obesity type Assessment: Condition is Worsening.  Biometric data collected today, was reviewed with patient.   Plan:Will start category 2 meal plan- provided with meal plan information and breakfast/lunch options. Advised to focus on meal plan at this time and maintain current exercise level.  Will check Vit B12 and Vit D, CBC, CMP, and lipid panel.  Prediabetes Assessment: Condition is Stable. Labs were reviewed.   Plan: Will start category 2 meal plan- provided with meal plan information and breakfast/lunch options. Advised to focus on meal plan at this time and maintain current exercise level.  Will recheck A1c and insulin.   History of thyroid nodule Assessment: Condition is Stable. Labs were reviewed. Her last TSH was 0.669 on 08/25/2020. Biopsy in 2016. She states  that the nodule has been unchanged for several years. Her last thyroid US was in 02/2016.  Plan: Continue to monitor alongside PCP.  Will check TSH and T4.  Other disorder of eating- emotional eating Assessment: Condition is Stable. She reports emotional eating and often snacks when bored.   Plan:We discussed mindful eating habits and she was given information to schedule a visit with Dr. Mallie Mussel if she chooses.      Follow up in 2 weeks. She was informed of the importance of frequent follow up visits to maximize her success with intensive lifestyle modifications for her multiple health conditions.  She is aware that we will review all of her lab results at next visit, and she is aware that anything is life threatening we will be contact her prior to visit to discuss results.  Attestation Statements:   Reviewed by clinician on day of visit: allergies, medications, problem list, medical history, surgical history, family history, social history, and previous encounter notes.   I,Cassie [name],acting as a Education administrator for [employer], DO.,have documented all relevant documentation on the behalf of Cassie Dance, DO,as directed by  Cassie Dance, DO while in the  presence of Cassie Dance, DO.   I, Cassie Dance, DO, have reviewed all documentation for this visit. The documentation on 05/22/22 for the exam, diagnosis, procedures, and orders are all accurate and complete.

## 2022-05-23 ENCOUNTER — Encounter: Payer: Self-pay | Admitting: Radiology

## 2022-05-23 LAB — CBC WITH DIFFERENTIAL/PLATELET
Basophils Absolute: 0 10*3/uL (ref 0.0–0.2)
Basos: 1 %
EOS (ABSOLUTE): 0 10*3/uL (ref 0.0–0.4)
Eos: 1 %
Hematocrit: 39.4 % (ref 34.0–46.6)
Hemoglobin: 13.2 g/dL (ref 11.1–15.9)
Immature Grans (Abs): 0 10*3/uL (ref 0.0–0.1)
Immature Granulocytes: 0 %
Lymphocytes Absolute: 2.2 10*3/uL (ref 0.7–3.1)
Lymphs: 29 %
MCH: 31.7 pg (ref 26.6–33.0)
MCHC: 33.5 g/dL (ref 31.5–35.7)
MCV: 95 fL (ref 79–97)
Monocytes Absolute: 0.6 10*3/uL (ref 0.1–0.9)
Monocytes: 8 %
Neutrophils Absolute: 4.7 10*3/uL (ref 1.4–7.0)
Neutrophils: 61 %
Platelets: 215 10*3/uL (ref 150–450)
RBC: 4.17 x10E6/uL (ref 3.77–5.28)
RDW: 13.3 % (ref 11.7–15.4)
WBC: 7.6 10*3/uL (ref 3.4–10.8)

## 2022-05-23 LAB — COMPREHENSIVE METABOLIC PANEL
ALT: 11 IU/L (ref 0–32)
AST: 12 IU/L (ref 0–40)
Albumin/Globulin Ratio: 1.7 (ref 1.2–2.2)
Albumin: 4.6 g/dL (ref 3.9–4.9)
Alkaline Phosphatase: 81 IU/L (ref 44–121)
BUN/Creatinine Ratio: 16 (ref 9–23)
BUN: 11 mg/dL (ref 6–24)
Bilirubin Total: 0.4 mg/dL (ref 0.0–1.2)
CO2: 21 mmol/L (ref 20–29)
Calcium: 9.5 mg/dL (ref 8.7–10.2)
Chloride: 104 mmol/L (ref 96–106)
Creatinine, Ser: 0.69 mg/dL (ref 0.57–1.00)
Globulin, Total: 2.7 g/dL (ref 1.5–4.5)
Glucose: 98 mg/dL (ref 70–99)
Potassium: 4.3 mmol/L (ref 3.5–5.2)
Sodium: 140 mmol/L (ref 134–144)
Total Protein: 7.3 g/dL (ref 6.0–8.5)
eGFR: 106 mL/min/{1.73_m2} (ref >59)

## 2022-05-23 LAB — INSULIN, RANDOM: INSULIN: 14.5 u[IU]/mL (ref 2.6–24.9)

## 2022-05-23 LAB — VITAMIN D 25 HYDROXY (VIT D DEFICIENCY, FRACTURES): Vit D, 25-Hydroxy: 32.9 ng/mL (ref 30.0–100.0)

## 2022-05-23 LAB — LIPID PANEL WITH LDL/HDL RATIO
Cholesterol, Total: 173 mg/dL (ref 100–199)
HDL: 57 mg/dL (ref >39)
LDL Chol Calc (NIH): 106 mg/dL — ABNORMAL HIGH (ref 0–99)
LDL/HDL Ratio: 1.9 ratio (ref 0.0–3.2)
Triglycerides: 49 mg/dL (ref 0–149)
VLDL Cholesterol Cal: 10 mg/dL (ref 5–40)

## 2022-05-23 LAB — HEMOGLOBIN A1C
Est. average glucose Bld gHb Est-mCnc: 117 mg/dL
Hgb A1c MFr Bld: 5.7 % — ABNORMAL HIGH (ref 4.8–5.6)

## 2022-05-23 LAB — TSH: TSH: 0.701 u[IU]/mL (ref 0.450–4.500)

## 2022-05-23 LAB — VITAMIN B12: Vitamin B-12: 423 pg/mL (ref 232–1245)

## 2022-05-23 LAB — T4, FREE: Free T4: 1.13 ng/dL (ref 0.82–1.77)

## 2022-06-05 ENCOUNTER — Ambulatory Visit (INDEPENDENT_AMBULATORY_CARE_PROVIDER_SITE_OTHER): Payer: Commercial Managed Care - PPO | Admitting: Family Medicine

## 2022-06-05 ENCOUNTER — Encounter (INDEPENDENT_AMBULATORY_CARE_PROVIDER_SITE_OTHER): Payer: Self-pay | Admitting: Family Medicine

## 2022-06-05 VITALS — BP 126/80 | HR 60 | Temp 97.6°F | Ht 70.0 in | Wt 233.8 lb

## 2022-06-05 DIAGNOSIS — F5089 Other specified eating disorder: Secondary | ICD-10-CM

## 2022-06-05 DIAGNOSIS — Z6833 Body mass index (BMI) 33.0-33.9, adult: Secondary | ICD-10-CM | POA: Diagnosis not present

## 2022-06-05 DIAGNOSIS — E785 Hyperlipidemia, unspecified: Secondary | ICD-10-CM

## 2022-06-05 DIAGNOSIS — E538 Deficiency of other specified B group vitamins: Secondary | ICD-10-CM | POA: Diagnosis not present

## 2022-06-05 DIAGNOSIS — E559 Vitamin D deficiency, unspecified: Secondary | ICD-10-CM | POA: Diagnosis not present

## 2022-06-05 DIAGNOSIS — E669 Obesity, unspecified: Secondary | ICD-10-CM | POA: Diagnosis not present

## 2022-06-05 DIAGNOSIS — E1169 Type 2 diabetes mellitus with other specified complication: Secondary | ICD-10-CM

## 2022-06-05 DIAGNOSIS — R7303 Prediabetes: Secondary | ICD-10-CM | POA: Diagnosis not present

## 2022-06-05 DIAGNOSIS — E66811 Obesity, class 1: Secondary | ICD-10-CM

## 2022-06-05 MED ORDER — VITAMIN D3 125 MCG (5000 UT) PO TABS: ORAL_TABLET | ORAL | 3 refills | Status: DC

## 2022-06-05 MED ORDER — CYANOCOBALAMIN 500 MCG PO TABS: 500.0000 ug | ORAL_TABLET | Freq: Every day | ORAL | Status: DC

## 2022-06-05 NOTE — Progress Notes (Signed)
Cassie [name], D.O.  ABFM, ABOM Specializing in Clinical Bariatric Medicine  Office located at: [address], [place]  [address]     Assessment and Plan:   No orders of the defined types were placed in this encounter.   There are no discontinued medications.   Meds ordered this encounter  Medications   cyanocobalamin (VITAMIN B12) 500 MCG tablet    Sig: Take 1 tablet (500 mcg total) by mouth daily.   Cholecalciferol (VITAMIN D3) 125 MCG (5000 UT) TABS    Sig: 5,000 IU OTC vitamin D3 daily.    Dispense:  90 tablet    Refill:  3    Obesity with current bmi 33.5 (start bmi 34.32/date 05/22/22) Assessment: Condition is Improving, but not optimized.. Biometric data collected today, was reviewed with patient.  Fat mass has decreased by 5.4lb. Muscle mass has not changed. Total body water has decreased by 4.8lb.   Plan: Continue with the Category 2 Plan with breakfast and lunch options.    Prediabetes Assessment: Condition is Improving, but not optimized..  Lab Results  Component Value Date   HGBA1C 5.7 (H) 05/22/2022   HGBA1C 5.4 08/25/2020   HGBA1C 5.4 02/24/2019   INSULIN 14.5 05/22/2022   INSULIN 5.2 11/09/2020   INSULIN 4.4 02/24/2019  She is not on any medications for prediabetes. This condition is being managed by nutrition plan.   Plan: Continue her prudent nutritional plan to manage her prediabetes. Cassie [name] will continue to work on weight loss, exercise, via their meal plan we devised to help decrease the risk of progressing to diabetes.  - We will recheck A1c and fasting insulin level in approximately 3 months from last check, or as deemed appropriate.   - We discussed possible medication for prediabetes and she decided at this time to decline and continue with meal plan to manage this condition   Other disorder of eating- emotional eating Assessment: Condition is At goal.. Denies any SI/HI. Mood is stable. Cravings and hunger are well  controlled while adhering to the meal plan and consuming enough protein. She is not on any medications.  Plan: Continue managing emotional eating through adherence the current nutritional plan.  - I recommend her to follow meal plan and not skip meals.  - Reminded patient of the importance of following their prudent nutrition plan and how food can affect mood as well to support emotional wellbeing.  - We will continue to monitor closely alongside PCP / other specialists.   Hyperlipidemia  Assessment: Condition is Improving, but not optimized..  Lab Results  Component Value Date   CHOL 173 05/22/2022   HDL 57 05/22/2022   LDLCALC 106 (H) 05/22/2022   TRIG 49 05/22/2022   CHOLHDL 3.2 08/25/2020  HLD levels are currently being managed by nutritional plan. She is not on any medication.  Plan: Continue management of HLD with her prudent nutritional plan. - We will continue routine screening as patient continues to achieve health goals along their weight loss journey  Vitamin B-12 Deficiency Assessment: Condition is Improving, but not optimized.. Labs were reviewed.  Lab Results  Component Value Date   [id number] 05/22/2022  Vitamin B-12 is currently being managed by nutritional plan. She is not any medication currently.   Plan: She will begin Vitamin B12 500 mcg daily. We will recheck in about three months.  Vitamin D Assessment: Condition is Not optimized.. Labs were reviewed.  Lab Results  Component Value Date   VD25OH  32.9 05/22/2022   VD25OH 38.0 08/25/2020   VD25OH 27.8 (L) 02/24/2019  Patient is not taking an Vitamin D supplement, managed by nutritional plan.   Plan: Patient will begin Ergocalciferol 5K IU daily. - weight loss will likely improve availability of vitamin D, thus encouraged Cassie [name] to continue with meal plan and their weight loss efforts to further improve this condition.  Thus, we will need to monitor levels regularly (every 3-4 mo on average) to keep levels  within normal limits and prevent over supplementation. - pt's questions and concerns regarding this condition addressed.    TREATMENT PLAN FOR OBESITY:  Recommended Dietary Goals Cassie [name] is currently in the action stage of change. As such, her goal is to continue weight management plan. She has agreed to continue the Category 2 Plan with breakfast and lunch options  Behavioral Intervention Additional resources provided today: category 2 meal plan information Evidence-based interventions for health behavior change were utilized today including the discussion of self monitoring techniques, problem-solving barriers and SMART goal setting techniques.   Regarding patient's less desirable eating habits and patterns, we employed the technique of small changes.  Pt will specifically work on continuing with meal planning and prepping next visit.    Recommended Physical Activity Goals Cassie [name] to work up to 150 minutes of moderate intensity aerobic activity a week and strengthening exercises 2-3 times per week for cardiovascular health, weight loss maintenance and preservation of muscle mass.  She has agreed to Continue current level of physical activity    FOLLOW UP: She will follow up in two to three weeks. She was informed of the importance of frequent follow up visits to maximize her success with intensive lifestyle modifications for her multiple health conditions.  Subjective:   Chief complaint: Obesity Cassie [name] is here to discuss her progress with her obesity treatment plan. She is on the the Category 2 Planwith breakfast and lunch options  and states she is following her eating plan approximately 95 % of the time. She states she is not exercising.   Interval History:  Cassie [name] is here today for her first follow-up office visit since starting the program with [place]. All blood work/ lab tests that were recently ordered by myself or an outside provider were reviewed with patient  today per their request. Extended time was spent counseling her on all new disease processes that were discovered or preexisting ones that are affected by BMI.  she understands that many of these abnormalities will need to monitored regularly along with the current treatment plan of prudent dietary changes, in which we are making each and every office visit, to improve these health parameters.  Since last office visit she has been following the meal plan most of the time. She has not had much hunger and cravings because she has increased her protein intake. For her snack calories she consumes almonds and popcorn. She has also been measuring her food portions.  Review of Systems:  Pertinent positives were addressed with patient today.  Weight Summary and Biometrics   Weight Lost Since Last Visit: 6lb  Weight Gained Since Last Visit: 0   Vitals Temp: 97.6 F (36.4 C) BP: 126/80 Pulse Rate: 60 SpO2: 99 %   Anthropometric Measurements Height: 5\' 10"  (1.778 m) Weight: 233 lb 12.8 oz (106.1 kg) BMI (Calculated): 33.55 Weight at Last Visit: 239lb Weight Lost Since Last Visit: 6lb Weight Gained Since Last Visit: 0 Starting Weight: 239lb Total Weight Loss (lbs): 6 lb (2.722  kg) Peak Weight: 239.2lb   Body Composition  Body Fat %: 42.9 % Fat Mass (lbs): 100.4 lbs Muscle Mass (lbs): 126.6 lbs Total Body Water (lbs): 87.8 lbs Visceral Fat Rating : 11   Other Clinical Data Fasting: no Labs: no Today's Visit #: 2 Starting Date: 05/22/22    Objective:   PHYSICAL EXAM:  Blood pressure 126/80, pulse 60, temperature 97.6 F (36.4 C), height 5\' 10"  (1.778 m), weight 233 lb 12.8 oz (106.1 kg), SpO2 99 %. Body mass index is 33.55 kg/m.  General: Well Developed, well nourished, and in no acute distress.  HEENT: Normocephalic, atraumatic Skin: Warm and dry, cap RF less 2 sec, good turgor Chest:  Normal excursion, shape, no gross abn Respiratory: speaking in full sentences, no  conversational dyspnea NeuroM-Sk: Ambulates w/o assistance, moves * 4 Psych: A and O *3, insight good, mood-full  DIAGNOSTIC DATA REVIEWED:  BMET    Component Value Date/Time   NA 140 05/22/2022 0854   K 4.3 05/22/2022 0854   CL 104 05/22/2022 0854   CO2 21 05/22/2022 0854   GLUCOSE 98 05/22/2022 0854   GLUCOSE 93 06/29/2014 1011   BUN 11 05/22/2022 0854   CREATININE 0.69 05/22/2022 0854   CREATININE 0.60 06/29/2014 1011   CALCIUM 9.5 05/22/2022 0854   GFRNONAA 108 02/24/2019 1105   GFRAA 124 02/24/2019 1105   Lab Results  Component Value Date   HGBA1C 5.7 (H) 05/22/2022   HGBA1C 5.8 (H) 06/29/2014   Lab Results  Component Value Date   INSULIN 14.5 05/22/2022   INSULIN 5.8 05/20/2018   Lab Results  Component Value Date   TSH 0.701 05/22/2022   CBC    Component Value Date/Time   WBC 7.6 05/22/2022 0854   WBC 8.6 06/29/2014 1011   RBC 4.17 05/22/2022 0854   RBC 4.25 06/29/2014 1011   HGB 13.2 05/22/2022 0854   HCT 39.4 05/22/2022 0854   PLT 215 05/22/2022 0854   MCV 95 05/22/2022 0854   MCH 31.7 05/22/2022 0854   MCH 31.3 06/29/2014 1011   MCHC 33.5 05/22/2022 0854   MCHC 33.9 06/29/2014 1011   RDW 13.3 05/22/2022 0854   Iron Studies    Component Value Date/Time   IRON 149 09/25/2016 1336   TIBC 277 09/25/2016 1336   FERRITIN 32 09/25/2016 1336   IRONPCTSAT 54 09/25/2016 1336   Lipid Panel     Component Value Date/Time   CHOL 173 05/22/2022 0854   TRIG 49 05/22/2022 0854   HDL 57 05/22/2022 0854   CHOLHDL 3.2 08/25/2020 0921   CHOLHDL 3.2 06/29/2014 1011   VLDL 13 06/29/2014 1011   LDLCALC 106 (H) 05/22/2022 0854   Hepatic Function Panel     Component Value Date/Time   PROT 7.3 05/22/2022 0854   ALBUMIN 4.6 05/22/2022 0854   AST 12 05/22/2022 0854   ALT 11 05/22/2022 0854   ALKPHOS 81 05/22/2022 0854   BILITOT 0.4 05/22/2022 0854      Component Value Date/Time   TSH 0.701 05/22/2022 0854   Nutritional Lab Results  Component Value  Date   VD25OH 32.9 05/22/2022   VD25OH 38.0 08/25/2020   VD25OH 27.8 (L) 02/24/2019    Attestations:   Reviewed by clinician on day of visit: allergies, medications, problem list, medical history, surgical history, family history, social history, and previous encounter notes.    I,Special Puri,acting as a scribe for [employer], DO.,have documented all relevant documentation on the behalf of Mellody Dance, DO,as directed  by  Mellody Dance, DO while in the presence of Mellody Dance, DO.   I, Mellody Dance, DO, have reviewed all documentation for this visit. The documentation on 06/05/22 for the exam, diagnosis, procedures, and orders are all accurate and complete.

## 2022-06-20 ENCOUNTER — Encounter (INDEPENDENT_AMBULATORY_CARE_PROVIDER_SITE_OTHER): Payer: Self-pay

## 2022-06-20 ENCOUNTER — Ambulatory Visit (INDEPENDENT_AMBULATORY_CARE_PROVIDER_SITE_OTHER): Payer: Commercial Managed Care - PPO | Admitting: Family Medicine

## 2022-06-21 ENCOUNTER — Other Ambulatory Visit (HOSPITAL_BASED_OUTPATIENT_CLINIC_OR_DEPARTMENT_OTHER): Payer: Self-pay

## 2022-06-21 ENCOUNTER — Other Ambulatory Visit: Payer: Self-pay | Admitting: Nurse Practitioner

## 2022-06-21 DIAGNOSIS — B3731 Acute candidiasis of vulva and vagina: Secondary | ICD-10-CM

## 2022-06-21 MED ORDER — FLUCONAZOLE 150 MG PO TABS
150.0000 mg | ORAL_TABLET | Freq: Once | ORAL | 2 refills | Status: AC
  Filled 2022-06-21: qty 2, 3d supply, fill #0

## 2022-06-21 MED ORDER — NYSTATIN 100000 UNIT/GM EX CREA
1.0000 | TOPICAL_CREAM | Freq: Two times a day (BID) | CUTANEOUS | 3 refills | Status: DC
  Filled 2022-06-21: qty 30, 15d supply, fill #0

## 2022-07-10 ENCOUNTER — Encounter (INDEPENDENT_AMBULATORY_CARE_PROVIDER_SITE_OTHER): Payer: Self-pay | Admitting: Family Medicine

## 2022-07-10 ENCOUNTER — Ambulatory Visit (INDEPENDENT_AMBULATORY_CARE_PROVIDER_SITE_OTHER): Payer: Commercial Managed Care - PPO | Admitting: Family Medicine

## 2022-07-10 VITALS — BP 120/82 | HR 94 | Temp 97.8°F | Ht 70.0 in | Wt 228.0 lb

## 2022-07-10 DIAGNOSIS — E669 Obesity, unspecified: Secondary | ICD-10-CM | POA: Diagnosis not present

## 2022-07-10 DIAGNOSIS — E538 Deficiency of other specified B group vitamins: Secondary | ICD-10-CM | POA: Diagnosis not present

## 2022-07-10 DIAGNOSIS — E559 Vitamin D deficiency, unspecified: Secondary | ICD-10-CM

## 2022-07-10 DIAGNOSIS — Z6832 Body mass index (BMI) 32.0-32.9, adult: Secondary | ICD-10-CM

## 2022-07-10 DIAGNOSIS — R7303 Prediabetes: Secondary | ICD-10-CM

## 2022-07-10 NOTE — Progress Notes (Signed)
Cassie [name], D.O.  ABFM, ABOM Specializing in Clinical Bariatric Medicine  Office located at: [address] [place], [place]  [address]     Assessment and Plan:   No orders of the defined types were placed in this encounter.   Medications Discontinued During This Encounter  Medication Reason   nystatin cream (MYCOSTATIN)      Prediabetes Assessment: Condition is not optimized.  Lab Results  Component Value Date   HGBA1C 5.7 (H) 05/22/2022   HGBA1C 5.4 08/25/2020   HGBA1C 5.4 02/24/2019   INSULIN 14.5 05/22/2022   INSULIN 5.2 11/09/2020   INSULIN 4.4 02/24/2019  - Patient is not on any prediabetic medication. This is currently diet controlled.  - She reports that sometimes she still has cravings after dinner.   Plan: - I recommended patient to have 8 ounces of protein at night or having a Tuna Packet to help reduce the after dinner cravings.   - In addition, we discussed Metformin, which can help [place] in the management of this disease process as well as with weight loss.  Will consider starting  Metformin in future as we will focus on prudent nutritional plan at this time.  - Continue to decrease simple carbs/ sugars; increase fiber and proteins -> follow her meal plan.   - Explained role of simple carbs and insulin levels on hunger and cravings - Sophea will continue to work on weight loss, exercise, via their meal plan we devised to help decrease the risk of progressing to diabetes.    Vitamin D deficiency Assessment: Condition is not optimized.  Lab Results  Component Value Date   VD25OH 32.9 05/22/2022   VD25OH 38.0 08/25/2020   VD25OH 27.8 (L) 02/24/2019  - She reports good compliance and tolerance with Ergocalciferol 5K IU daily. Denies any side effects.   Plan:  - Continue with Med.  - weight loss will likely improve availability of vitamin D, thus encouraged [place] to continue with meal plan and their weight loss efforts to further improve this  condition.    Vitamin B 12 deficiency Assessment: Condition is stable. Lab Results  Component Value Date   VITAMINB12 423 05/22/2022  - She is compliant with OTC Vitamin B12 500 mcg daily. Denies any side effects.  Plan: - Continue with med.  - Continue her prudent nutritional plan that is low in simple carbohydrates, saturated fats and trans fats to goal of 5-10% weight loss to achieve significant health benefits.    TREATMENT PLAN FOR OBESITY: Obesity with current bmi 32.71 (start bmi 34.32/date 05/22/22) Assessment: Condition is improving, but not optimized. Biometric data collected today, was reviewed with patient.  Fat mass has decreased by 2.6 lb. Muscle mass has decreased by 2.6lb. Total body water has increased by 2.4 lb.   Plan:  Addisynn is currently in the action stage of change. As such, her goal is to continue weight management plan. Estha will work on healthier eating habits and continue the Category 2 meal plan.  - I discussed with her ways she can increase her protein intake throughout the day.  - I recommended her to go on the Weight [employer] website and try different meals from there.  Behavioral Intervention Additional resources provided today: category 2 meal plan information Evidence-based interventions for health behavior change were utilized today including the discussion of self monitoring techniques, problem-solving barriers and SMART goal setting techniques.   Regarding patient's less desirable eating habits and patterns, we employed the technique of  small changes.  Pt will specifically work on: continue adherence to prudent nutritional plan and increase lean protein intake for next visit.    Recommended Physical Activity Goals Jodilyn has been advised to work up to 150 minutes of moderate intensity aerobic activity a week and strengthening exercises 2-3 times per week for cardiovascular health, weight loss maintenance and preservation of muscle mass.  She has agreed  to Continue current level of physical activity   FOLLOW UP: No follow-ups on file. She was informed of the importance of frequent follow up visits to maximize her success with intensive lifestyle modifications for her multiple health conditions.  Subjective:   Chief complaint: Obesity [name] is here to discuss her progress with her obesity treatment plan. She is on the the Category 2 Plan and states she is following her eating plan approximately 85% of the time. She states she is walking 2 miles 36 minutes 3-4 days per week.  Interval History:  Cassie [name] is here for a follow up office visit.  Since last office visit: - She endorses that it is difficult to eat all her protein at dinner. She usually gets in 6 ounces of protein at night.  - She reports that sometimes she still has cravings after dinner.  - When she eats salads, she uses a low-calorie dressing + 6 ounces of protein.  - She states she drinks about 64 ounces of water daily. She drinks less water on OR days.   Review of Systems:  Pertinent positives were addressed with patient today.  Weight Summary and Biometrics   Weight Lost Since Last Visit: 5 lb  No data recorded   Vitals Temp: 97.8 F (36.6 C) BP: 120/82 Pulse Rate: 94 SpO2: 97 %   Anthropometric Measurements Height:  (1.778 m) Weight: 228 lb (103.4 kg) BMI (Calculated): 32.71 Weight Lost Since Last Visit: 5 lb Starting Weight: 239 lb Total Weight Loss (lbs): 11 lb (4.99 kg) Peak Weight: 239 lb   Body Composition  Body Fat %: 42.8 % Fat Mass (lbs): 97.8 lbs Muscle Mass (lbs): 124 lbs Total Body Water (lbs): 90.2 lbs Visceral Fat Rating : 10   Other Clinical Data Fasting: No Labs: No Today's Visit #: 3 Starting Date: 05/22/22    Objective:   PHYSICAL EXAM: Blood pressure 120/82, pulse 94, temperature 97.8 F (36.6 C), height  (1.778 m), weight 228 lb (103.4 kg), SpO2 97 %. Body mass index is 32.71 kg/m.  General:  Well Developed, well nourished, and in no acute distress.  HEENT: Normocephalic, atraumatic Skin: Warm and dry, cap RF less 2 sec, good turgor Chest:  Normal excursion, shape, no gross abn Respiratory: speaking in full sentences, no conversational dyspnea NeuroM-Sk: Ambulates w/o assistance, moves * 4 Psych: A and O *3, insight good, mood-full  DIAGNOSTIC DATA REVIEWED:  BMET    Component Value Date/Time   NA 140 05/22/2022 0854   K 4.3 05/22/2022 0854   CL 104 05/22/2022 0854   CO2 21 05/22/2022 0854   GLUCOSE 98 05/22/2022 0854   GLUCOSE 93 06/29/2014 1011   BUN 11 05/22/2022 0854   CREATININE 0.69 05/22/2022 0854   CREATININE 0.60 06/29/2014 1011   CALCIUM 9.5 05/22/2022 0854   GFRNONAA 108 02/24/2019 1105   GFRAA 124 02/24/2019 1105   Lab Results  Component Value Date   HGBA1C 5.7 (H) 05/22/2022   HGBA1C 5.8 (H) 06/29/2014   Lab Results  Component Value Date   INSULIN 14.5 05/22/2022  INSULIN 5.8 05/20/2018   Lab Results  Component Value Date   TSH 0.701 05/22/2022   CBC    Component Value Date/Time   WBC 7.6 05/22/2022 0854   WBC 8.6 06/29/2014 1011   RBC 4.17 05/22/2022 0854   RBC 4.25 06/29/2014 1011   HGB 13.2 05/22/2022 0854   HCT 39.4 05/22/2022 0854   PLT 215 05/22/2022 0854   MCV 95 05/22/2022 0854   MCH 31.7 05/22/2022 0854   MCH 31.3 06/29/2014 1011   MCHC 33.5 05/22/2022 0854   MCHC 33.9 06/29/2014 1011   RDW 13.3 05/22/2022 0854   Iron Studies    Component Value Date/Time   IRON 149 09/25/2016 1336   TIBC 277 09/25/2016 1336   FERRITIN 32 09/25/2016 1336   IRONPCTSAT 54 09/25/2016 1336   Lipid Panel     Component Value Date/Time   CHOL 173 05/22/2022 0854   TRIG 49 05/22/2022 0854   HDL 57 05/22/2022 0854   CHOLHDL 3.2 08/25/2020 0921   CHOLHDL 3.2 06/29/2014 1011   VLDL 13 06/29/2014 1011   LDLCALC 106 (H) 05/22/2022 0854   Hepatic Function Panel     Component Value Date/Time   PROT 7.3 05/22/2022 0854   ALBUMIN 4.6  05/22/2022 0854   AST 12 05/22/2022 0854   ALT 11 05/22/2022 0854   ALKPHOS 81 05/22/2022 0854   BILITOT 0.4 05/22/2022 0854      Component Value Date/Time   TSH 0.701 05/22/2022 0854   Nutritional Lab Results  Component Value Date   VD25OH 32.9 05/22/2022   VD25OH 38.0 08/25/2020   VD25OH 27.8 (L) 02/24/2019    Attestations:   Reviewed by clinician on day of visit: allergies, medications, problem list, medical history, surgical history, family history, social history, and previous encounter notes.  I,Special Puri,acting as a Neurosurgeon for [employer], DO.,have documented all relevant documentation on the behalf of Thomasene Lot, DO,as directed by  Thomasene Lot, DO while in the presence of Thomasene Lot, DO.   I, Thomasene Lot, DO, have reviewed all documentation for this visit. The documentation on 07/10/22 for the exam, diagnosis, procedures, and orders are all accurate and complete.

## 2022-07-19 DIAGNOSIS — H524 Presbyopia: Secondary | ICD-10-CM | POA: Diagnosis not present

## 2022-07-19 DIAGNOSIS — H52223 Regular astigmatism, bilateral: Secondary | ICD-10-CM | POA: Diagnosis not present

## 2022-07-23 ENCOUNTER — Encounter (INDEPENDENT_AMBULATORY_CARE_PROVIDER_SITE_OTHER): Payer: Self-pay | Admitting: Family Medicine

## 2022-07-23 ENCOUNTER — Ambulatory Visit (INDEPENDENT_AMBULATORY_CARE_PROVIDER_SITE_OTHER): Payer: Commercial Managed Care - PPO | Admitting: Family Medicine

## 2022-07-23 VITALS — BP 102/68 | HR 56 | Temp 98.0°F | Ht 70.0 in | Wt 227.0 lb

## 2022-07-23 DIAGNOSIS — R7303 Prediabetes: Secondary | ICD-10-CM | POA: Diagnosis not present

## 2022-07-23 DIAGNOSIS — Z6832 Body mass index (BMI) 32.0-32.9, adult: Secondary | ICD-10-CM | POA: Diagnosis not present

## 2022-07-23 DIAGNOSIS — E669 Obesity, unspecified: Secondary | ICD-10-CM | POA: Diagnosis not present

## 2022-07-23 DIAGNOSIS — E559 Vitamin D deficiency, unspecified: Secondary | ICD-10-CM | POA: Diagnosis not present

## 2022-07-23 NOTE — Progress Notes (Signed)
Carlye Grippe, D.O.  ABFM, ABOM Specializing in Clinical Bariatric Medicine  Office located at: [address] [place], [place]  [address]     Assessment and Plan:   Vitamin D deficiency Assessment: Condition is Not optimized. And not at goal Lab Results  Component Value Date   VD25OH 32.9 05/22/2022   VD25OH 38.0 08/25/2020   VD25OH 27.8 (L) 02/24/2019  - No issues with OTC Cholecalciferol 5,000 IU weekly.  Compliance good since last labs reviewed.  Denies any side effects or concerns .  Plan: - Continue with med as recommended - Continue her prudent nutritional plan that is low in simple carbohydrates, saturated fats and trans fats to goal of 5-10% weight loss to achieve significant health benefits.     Prediabetes Assessment: Condition is Not optimized. Lab Results  Component Value Date   HGBA1C 5.7 (H) 05/22/2022   HGBA1C 5.4 08/25/2020   HGBA1C 5.4 02/24/2019   INSULIN 14.5 05/22/2022   INSULIN 5.2 11/09/2020   INSULIN 4.4 02/24/2019  - Condition is diet controlled. Ximena is not on any medication for her Prediabetes.  - She endorses that her hunger and cravings are controlled when eating on plan.   Plan: - Continue her prudent nutritional plan that is low in simple carbohydrates, saturated fats and trans fats to goal of 5-10% weight loss to achieve significant health benefits.  Pt encouraged to continually advance exercise and cardiovascular fitness as tolerated throughout weight loss journey.    TREATMENT PLAN FOR OBESITY: Obesity with current bmi 32.57 (start bmi 34.32/date 05/22/22) Assessment: Condition is improving, but not optimized. Biometric data collected today, was reviewed with patient.  Fat mass has decreased by 2.2 lb. Muscle mass has increased by 0.8 lb. Total body water has decreased by 4.2 lb.   Plan:  - Zana will work on healthier eating habits and continue the Category 2 meal plan.  - Counseling was done on the importance of 7-9 hrs of  sleep daily for optimizing weight loss.  - We also discussed the importance of focusing on limiting non-neccessary work and personal events so she has more time for her self care activities.   Behavioral Intervention Additional resources provided today: patient declined Evidence-based interventions for health behavior change were utilized today including the discussion of self monitoring techniques, problem-solving barriers and SMART goal setting techniques.   Regarding patient's less desirable eating habits and patterns, we employed the technique of small changes.  Pt will specifically work on: continuing to increase lean protein intake and working on drinking 100 ounces of water daily next visit.    Recommended Physical Activity Goals Jacquel has been advised to work up to 150 minutes of moderate intensity aerobic activity a week and strengthening exercises 2-3 times per week for cardiovascular health, weight loss maintenance and preservation of muscle mass.  She has agreed to Continue current level of physical activity   FOLLOW UP: Return in about 2 weeks (around 08/06/2022). She was informed of the importance of frequent follow up visits to maximize her success with intensive lifestyle modifications for her multiple health conditions.    Subjective:   Chief complaint: Obesity Jajuana is here to discuss her progress with her obesity treatment plan. She is on the the Category 2 Plan and states she is following her eating plan approximately 85% of the time. She states she is walking 45-50 minutes 3 days per week.  Interval History:  Taryne Macartney is here for a follow up office  visit.  Since last office visit: - She endorses that it was difficult to eat healthy last weekend due to family events.  - She also reports that work has been stressful lately, which resulted in her skipping meals and not sleeping enough hours on a few occasions.  - Apart from this, she has been trying her best to  follow the meal plan and increase her protein intake.  - She endorses that she is working on drinking 100 ounces of water daily.  - When eating on plan, her hunger and cravings are well controlled.   She declines need for a change in our treatment plan at this time.  Review of Systems:  Pertinent positives were addressed with patient today.   Weight Summary and Biometrics   Weight Lost Since Last Visit: 1 lb  Weight Gained Since Last Visit: 0    Vitals Temp: 98 F (36.7 C) BP: 102/68 Pulse Rate: (!) 56 SpO2: 94 %   Anthropometric Measurements Height: 5\' 10"  (1.778 m) Weight: 227 lb (103 kg) BMI (Calculated): 32.57 Weight at Last Visit: 228 lb Weight Lost Since Last Visit: 1 lb Weight Gained Since Last Visit: 0 Starting Weight: 239 lb Total Weight Loss (lbs): 12 lb (5.443 kg) Peak Weight: 239 lb   Body Composition  Body Fat %: 42.1 % Fat Mass (lbs): 95.6 lbs Muscle Mass (lbs): 124.8 lbs Total Body Water (lbs): 86 lbs Visceral Fat Rating : 10   Other Clinical Data Fasting: No Labs: No Today's Visit #: 4 Starting Date: 05/22/22    Objective:   PHYSICAL EXAM: Blood pressure 102/68, pulse (!) 56, temperature 98 F (36.7 C), height 5\' 10"  (1.778 m), weight 227 lb (103 kg), SpO2 94 %. Body mass index is 32.57 kg/m.  General: Well Developed, well nourished, and in no acute distress.  HEENT: Normocephalic, atraumatic Skin: Warm and dry, cap RF less 2 sec, good turgor Chest:  Normal excursion, shape, no gross abn Respiratory: speaking in full sentences, no conversational dyspnea NeuroM-Sk: Ambulates w/o assistance, moves * 4 Psych: A and O *3, insight good, mood-full  DIAGNOSTIC DATA REVIEWED:  BMET    Component Value Date/Time   NA 140 05/22/2022 0854   K 4.3 05/22/2022 0854   CL 104 05/22/2022 0854   CO2 21 05/22/2022 0854   GLUCOSE 98 05/22/2022 0854   GLUCOSE 93 06/29/2014 1011   BUN 11 05/22/2022 0854   CREATININE 0.69 05/22/2022 0854    CREATININE 0.60 06/29/2014 1011   CALCIUM 9.5 05/22/2022 0854   GFRNONAA 108 02/24/2019 1105   GFRAA 124 02/24/2019 1105   Lab Results  Component Value Date   HGBA1C 5.7 (H) 05/22/2022   HGBA1C 5.8 (H) 06/29/2014   Lab Results  Component Value Date   INSULIN 14.5 05/22/2022   INSULIN 5.8 05/20/2018   Lab Results  Component Value Date   TSH 0.701 05/22/2022   CBC    Component Value Date/Time   WBC 7.6 05/22/2022 0854   WBC 8.6 06/29/2014 1011   RBC 4.17 05/22/2022 0854   RBC 4.25 06/29/2014 1011   HGB 13.2 05/22/2022 0854   HCT 39.4 05/22/2022 0854   PLT 215 05/22/2022 0854   MCV 95 05/22/2022 0854   MCH 31.7 05/22/2022 0854   MCH 31.3 06/29/2014 1011   MCHC 33.5 05/22/2022 0854   MCHC 33.9 06/29/2014 1011   RDW 13.3 05/22/2022 0854   Iron Studies    Component Value Date/Time   IRON 149 09/25/2016 1336  TIBC 277 09/25/2016 1336   FERRITIN 32 09/25/2016 1336   IRONPCTSAT 54 09/25/2016 1336   Lipid Panel     Component Value Date/Time   CHOL 173 05/22/2022 0854   TRIG 49 05/22/2022 0854   HDL 57 05/22/2022 0854   CHOLHDL 3.2 08/25/2020 0921   CHOLHDL 3.2 06/29/2014 1011   VLDL 13 06/29/2014 1011   LDLCALC 106 (H) 05/22/2022 0854   Hepatic Function Panel     Component Value Date/Time   PROT 7.3 05/22/2022 0854   ALBUMIN 4.6 05/22/2022 0854   AST 12 05/22/2022 0854   ALT 11 05/22/2022 0854   ALKPHOS 81 05/22/2022 0854   BILITOT 0.4 05/22/2022 0854      Component Value Date/Time   TSH 0.701 05/22/2022 0854   Nutritional Lab Results  Component Value Date   VD25OH 32.9 05/22/2022   VD25OH 38.0 08/25/2020   VD25OH 27.8 (L) 02/24/2019    Attestations:   Reviewed by clinician on day of visit: allergies, medications, problem list, medical history, surgical history, family history, social history, and previous encounter notes.   Patient was in the office today and time spent on visit including pre-visit chart review and post-visit care/coordination  of care and electronic medical record documentation was 30 minutes. 50% of that time was in face to face counseling of this patient's medical condition(s) and providing education on treatment options to always include the first-line treatment of diet and lifestyle modification.    I,Special Puri,acting as a Neurosurgeon for [employer], DO.,have documented all relevant documentation on the behalf of Thomasene Lot, DO,as directed by  Thomasene Lot, DO while in the presence of Thomasene Lot, DO.   I, Thomasene Lot, DO, have reviewed all documentation for this visit. The documentation on 07/24/22 for the exam, diagnosis, procedures, and orders are all accurate and complete.

## 2022-08-07 ENCOUNTER — Ambulatory Visit (INDEPENDENT_AMBULATORY_CARE_PROVIDER_SITE_OTHER): Payer: Commercial Managed Care - PPO | Admitting: Family Medicine

## 2022-08-07 ENCOUNTER — Encounter (INDEPENDENT_AMBULATORY_CARE_PROVIDER_SITE_OTHER): Payer: Self-pay | Admitting: Family Medicine

## 2022-08-07 VITALS — BP 106/69 | HR 77 | Temp 98.5°F | Ht 70.0 in | Wt 226.0 lb

## 2022-08-07 DIAGNOSIS — E559 Vitamin D deficiency, unspecified: Secondary | ICD-10-CM | POA: Diagnosis not present

## 2022-08-07 DIAGNOSIS — E669 Obesity, unspecified: Secondary | ICD-10-CM

## 2022-08-07 DIAGNOSIS — Z6832 Body mass index (BMI) 32.0-32.9, adult: Secondary | ICD-10-CM

## 2022-08-07 DIAGNOSIS — R7303 Prediabetes: Secondary | ICD-10-CM | POA: Diagnosis not present

## 2022-08-07 DIAGNOSIS — Z6834 Body mass index (BMI) 34.0-34.9, adult: Secondary | ICD-10-CM

## 2022-08-07 NOTE — Progress Notes (Signed)
Cassie [name], D.O.  ABFM, ABOM Specializing in Clinical Bariatric Medicine  Office located at: [address] [place], [place]  [address]     Assessment and Plan:   Recheck labs around 6/20  Prediabetes Assessment: Condition is not optimized. Lab Results  Component Value Date   HGBA1C 5.7 (H) 05/22/2022   HGBA1C 5.4 08/25/2020   HGBA1C 5.4 02/24/2019   INSULIN 14.5 05/22/2022   INSULIN 5.2 11/09/2020   INSULIN 4.4 02/24/2019  - Her prediabetes is diet controlled. She is not taking any medication for this condition. - Endorses that her hunger/cravings have been well controlled when eating the foods on the meal plan.    Plan:.  - Continue to decrease simple carbs/ sugars; increase fiber and proteins -> follow her meal plan.   Cassie [name] will continue to work on weight loss, exercise, via their meal plan we devised to help decrease the risk of progressing to diabetes.    Vitamin D deficiency Assessment: Condition is not optimized. Lab Results  Component Value Date   VD25OH 32.9 05/22/2022   VD25OH 38.0 08/25/2020   VD25OH 27.8 (L) 02/24/2019  - Reports good compliance and tolerance with OTC Cholecalciferol 5,000 IU daily. Denies any adverse effects.   Plan: - Continue with OTC med. - Will continue to monitor levels regularly to keep levels within normal limits and prevent over supplementation.   TREATMENT PLAN FOR OBESITY: BMI 32.0-32.9,adult Obesity, Beginning BMI  34.32/date 05/22/22 Assessment:  Cassie [name] is here to discuss her progress with her obesity treatment plan along with follow-up of her obesity related diagnoses. See Medical Weight Management Flowsheet for complete bioelectrical impedance results.  Condition is slightly improving. Biometric data collected today, was reviewed with patient.   Since last office visit on 07/23/22 patient's Fat mass has decreased by 0.8 lb. Muscle mass has increased by 0.2 lb. Total body water has increased by 1  lb.  Counseling done on how various foods will affect these numbers and how to maximize success  Total lbs lost to date: 10 Total weight loss percentage to date: 4.24   Plan:  - Continue the Category 2 meal plan.   - Showed the pt [employer] protein shakes that she can have during lunch on days she is very busy.   - Again discussed the importance of focusing on her-self, making herself a priority, and having balance in her life.   Behavioral Intervention Additional resources provided today: patient declined Evidence-based interventions for health behavior change were utilized today including the discussion of self monitoring techniques, problem-solving barriers and SMART goal setting techniques.   Regarding patient's less desirable eating habits and patterns, we employed the technique of small changes.  Pt will specifically work on: finding more time for herself for next visit.    Recommended Physical Activity Goals  Cassie [name] has been advised to slowly work up to 150 minutes of moderate intensity aerobic activity a week and strengthening exercises 2-3 times per week for cardiovascular health, weight loss maintenance and preservation of muscle mass.   She has agreed to Continue current level of physical activity    FOLLOW UP: Return in about 2 weeks (around 08/21/2022). She was informed of the importance of frequent follow up visits to maximize her success with intensive lifestyle modifications for her multiple health conditions.   Subjective:   Chief complaint: Obesity Cassie [name] is here to discuss her progress with her obesity treatment plan. She is on the the Category 2 Plan and  states she is following her eating plan approximately 80% of the time. She states she is walking 30-40 minutes 3 days per week.  Interval History:  Cassie [name] is here for a follow up office visit.     Since last office visit:   - Endorses sometimes skipping lunches on days when she has a busy OR schedule.   - Reports that work has been stressful, busy, and difficult. - She recently found herself a trainer.  - When eating on plan, her hunger and cravings are well controlled.     Review of Systems:  Pertinent positives were addressed with patient today.  Weight Summary and Biometrics   Weight Lost Since Last Visit: 1lb  Weight Gained Since Last Visit: 0    Vitals Temp: 98.5 F (36.9 C) BP: 106/69 Pulse Rate: 77 SpO2: 97 %   Anthropometric Measurements Height: 5\' 10"  (1.778 m) Weight: 226 lb (102.5 kg) BMI (Calculated): 32.43 Weight at Last Visit: 227lb Weight Lost Since Last Visit: 1lb Weight Gained Since Last Visit: 0 Starting Weight: 236lb Total Weight Loss (lbs): 13 lb (5.897 kg)   Body Composition  Body Fat %: 41.9 % Fat Mass (lbs): 94.8 lbs Muscle Mass (lbs): 125 lbs Total Body Water (lbs): 87 lbs Visceral Fat Rating : 10   Other Clinical Data Fasting: No Labs: no Today's Visit #: 5 Starting Date: 05/22/22   Objective:   PHYSICAL EXAM: Blood pressure 106/69, pulse 77, temperature 98.5 F (36.9 C), height 5\' 10"  (1.778 m), weight 226 lb (102.5 kg), SpO2 97 %. Body mass index is 32.43 kg/m.  General: Well Developed, well nourished, and in no acute distress.  HEENT: Normocephalic, atraumatic Skin: Warm and dry, cap RF less 2 sec, good turgor Chest:  Normal excursion, shape, no gross abn Respiratory: speaking in full sentences, no conversational dyspnea NeuroM-Sk: Ambulates w/o assistance, moves * 4 Psych: A and O *3, insight good, mood-full  DIAGNOSTIC DATA REVIEWED:  BMET    Component Value Date/Time   NA 140 05/22/2022 0854   K 4.3 05/22/2022 0854   CL 104 05/22/2022 0854   CO2 21 05/22/2022 0854   GLUCOSE 98 05/22/2022 0854   GLUCOSE 93 06/29/2014 1011   BUN 11 05/22/2022 0854   CREATININE 0.69 05/22/2022 0854   CREATININE 0.60 06/29/2014 1011   CALCIUM 9.5 05/22/2022 0854   GFRNONAA 108 02/24/2019 1105   GFRAA 124 02/24/2019 1105    Lab Results  Component Value Date   HGBA1C 5.7 (H) 05/22/2022   HGBA1C 5.8 (H) 06/29/2014   Lab Results  Component Value Date   INSULIN 14.5 05/22/2022   INSULIN 5.8 05/20/2018   Lab Results  Component Value Date   TSH 0.701 05/22/2022   CBC    Component Value Date/Time   WBC 7.6 05/22/2022 0854   WBC 8.6 06/29/2014 1011   RBC 4.17 05/22/2022 0854   RBC 4.25 06/29/2014 1011   HGB 13.2 05/22/2022 0854   HCT 39.4 05/22/2022 0854   PLT 215 05/22/2022 0854   MCV 95 05/22/2022 0854   MCH 31.7 05/22/2022 0854   MCH 31.3 06/29/2014 1011   MCHC 33.5 05/22/2022 0854   MCHC 33.9 06/29/2014 1011   RDW 13.3 05/22/2022 0854   Iron Studies    Component Value Date/Time   IRON 149 09/25/2016 1336   TIBC 277 09/25/2016 1336   FERRITIN 32 09/25/2016 1336   IRONPCTSAT 54 09/25/2016 1336   Lipid Panel     Component Value Date/Time  CHOL 173 05/22/2022 0854   TRIG 49 05/22/2022 0854   HDL 57 05/22/2022 0854   CHOLHDL 3.2 08/25/2020 0921   CHOLHDL 3.2 06/29/2014 1011   VLDL 13 06/29/2014 1011   LDLCALC 106 (H) 05/22/2022 0854   Hepatic Function Panel     Component Value Date/Time   PROT 7.3 05/22/2022 0854   ALBUMIN 4.6 05/22/2022 0854   AST 12 05/22/2022 0854   ALT 11 05/22/2022 0854   ALKPHOS 81 05/22/2022 0854   BILITOT 0.4 05/22/2022 0854      Component Value Date/Time   TSH 0.701 05/22/2022 0854   Nutritional Lab Results  Component Value Date   VD25OH 32.9 05/22/2022   VD25OH 38.0 08/25/2020   VD25OH 27.8 (L) 02/24/2019    Attestations:   Reviewed by clinician on day of visit: allergies, medications, problem list, medical history, surgical history, family history, social history, and previous encounter notes.  Patient was in the office today and time spent on visit including pre-visit chart review and post-visit care/coordination of care and electronic medical record documentation was 30 minutes. 50% of that time was in face to face counseling of this  patient's medical condition(s) and providing education on treatment options to always include the first-line treatment of diet and lifestyle modification.    I,Special Puri,acting as a Neurosurgeon for [employer], DO.,have documented all relevant documentation on the behalf of Cassie Lot, DO,as directed by  Cassie Lot, DO while in the presence of Cassie Lot, DO.   I, Cassie Lot, DO, have reviewed all documentation for this visit. The documentation on 08/07/22 for the exam, diagnosis, procedures, and orders are all accurate and complete.

## 2022-08-26 ENCOUNTER — Ambulatory Visit (INDEPENDENT_AMBULATORY_CARE_PROVIDER_SITE_OTHER): Payer: Commercial Managed Care - PPO | Admitting: Family Medicine

## 2022-08-26 ENCOUNTER — Encounter (INDEPENDENT_AMBULATORY_CARE_PROVIDER_SITE_OTHER): Payer: Self-pay | Admitting: Family Medicine

## 2022-08-26 VITALS — BP 110/64 | HR 77 | Temp 98.3°F | Ht 70.0 in | Wt 225.0 lb

## 2022-08-26 DIAGNOSIS — E559 Vitamin D deficiency, unspecified: Secondary | ICD-10-CM | POA: Diagnosis not present

## 2022-08-26 DIAGNOSIS — E669 Obesity, unspecified: Secondary | ICD-10-CM | POA: Diagnosis not present

## 2022-08-26 DIAGNOSIS — E538 Deficiency of other specified B group vitamins: Secondary | ICD-10-CM | POA: Diagnosis not present

## 2022-08-26 DIAGNOSIS — R7303 Prediabetes: Secondary | ICD-10-CM | POA: Diagnosis not present

## 2022-08-26 DIAGNOSIS — Z6832 Body mass index (BMI) 32.0-32.9, adult: Secondary | ICD-10-CM | POA: Diagnosis not present

## 2022-08-26 DIAGNOSIS — E66811 Obesity, class 1: Secondary | ICD-10-CM

## 2022-08-26 NOTE — Progress Notes (Signed)
Cassie [name], D.O.  ABFM, ABOM Specializing in Clinical Bariatric Medicine  Office located at: [address] [place], [place]  [address]     Assessment and Plan:   Check Fasting Labs next OV  Prediabetes Assessment: Condition is not optimized.  - Her prediabetes is currently managed with diet and weight loss. She is not on any prediabetic medication. Since last OV, her hunger and cravings have been stable.    Lab Results  Component Value Date   HGBA1C 5.7 (H) 05/22/2022   HGBA1C 5.4 08/25/2020   HGBA1C 5.4 02/24/2019   INSULIN 14.5 05/22/2022   INSULIN 5.2 11/09/2020   INSULIN 4.4 02/24/2019    Plan: Continue her prudent nutritional plan that is low in simple carbohydrates to goal of 5-10% weight loss to achieve significant health benefits.  Pt encouraged to continually advance exercise and cardiovascular fitness as tolerated throughout weight loss journey.  Will continue to monitor condition closely alongside PCP    Vitamin D deficiency Assessment: Condition is not optimized. Patient's compliance of OTC Vitamin D3 5,000 IU daily good since last labs were reviewed.  Denies any adverse effects.  Lab Results  Component Value Date   VD25OH 32.9 05/22/2022   VD25OH 38.0 08/25/2020   VD25OH 27.8 (L) 02/24/2019   Plan: Continue with OTC supplement.  We will continue to monitor levels regularly (every 3-4 mo on average) to keep levels within normal limits and prevent over supplementation.  Recheck labs next OV.    Vitamin B 12 deficiency Assessment: Condition is not optimal.  Patient has been compliant and tolerant with OTC Vitamin B12 500 mcg daily.  Denies any side effects. Some improvement in energy levels.  Lab Results  Component Value Date   VITAMINB12 423 05/22/2022    Plan: Continue with OTC supplement.  Continue their prudent nutritional plan and focus on b12 rich foods such as lean red meats; poultry; eggs; seafood; beans, peas, and lentils; nuts and  seeds; and soy products. We will continue to monitor as deemed clinically necessary.    TREATMENT PLAN FOR OBESITY: BMI 32.0-32.9,adult Obesity, Beginning BMI  34.32/date 05/22/22 Assessment:  Cassie [name] is here to discuss her progress with her obesity treatment plan along with follow-up of her obesity related diagnoses. See Medical Weight Management Flowsheet for complete bioelectrical impedance results.  Condition is improving. Biometric data collected today, was reviewed with patient.   Since last office visit on 08/07/22 patient's  Muscle mass has not changed. Fat mass has decreased by 0.6 lb. Total body water has increased by 0.2 lb.  Counseling done on how various foods will affect these numbers and how to maximize success  Total lbs lost to date: 11  Total weight loss percentage to date: 4.66   Plan:  - Continue the Category 2 meal plan with breakfast and lunch options.    Behavioral Intervention Additional resources provided today: Resistance band exercises handout Evidence-based interventions for health behavior change were utilized today including the discussion of self monitoring techniques, problem-solving barriers and SMART goal setting techniques.   Regarding patient's less desirable eating habits and patterns, we employed the technique of small changes.  Pt will specifically work on: continue with following meal plan, self care, and begin resistance training 10 minutes, 2 days per week for next visit.     Recommended Physical Activity Goals Cassie [name] has been advised to slowly work up to 150 minutes of moderate intensity aerobic activity a week and strengthening exercises 2-3 times  per week for cardiovascular health, weight loss maintenance and preservation of muscle mass.   She has agreed to Think about ways to increase daily physical activity and overcoming barriers to exercise   FOLLOW UP: Return in about 2 weeks (around 09/09/2022). She was informed of the  importance of frequent follow up visits to maximize her success with intensive lifestyle modifications for her multiple health conditions.   Subjective:   Chief complaint: Obesity Cassie [name] is here to discuss her progress with her obesity treatment plan. She is on the the Category 2 Plan with breakfast and lunch options and states she is following her eating plan approximately 80% of the time.  She states she is walking 40 minutes 3 days per week.  Interval History:  Cassie [name] is here for a follow up office visit.     Since last office visit:   - Endorses that her work-life balance is slowly improving.  - For stress management,  Cassie [name] endorses walks, goes to church, and participates in bible study.  - Reports that the hardest part of the meal plan during the summer time is the "limitation of certain vegetables and fruits." - She has been eating most of the protein on her meal plan and denies having any issues with hunger/cravings.    Review of Systems:  Pertinent positives were addressed with patient today.  Weight Summary and Biometrics   Weight Lost Since Last Visit: 1lb  Weight Gained Since Last Visit: 0    Vitals Temp: 98.3 F (36.8 C) BP: 110/64 Pulse Rate: 77 SpO2: 97 %   Anthropometric Measurements Height: 5\' 10"  (1.778 m) Weight: 225 lb (102.1 kg) BMI (Calculated): 32.28 Weight at Last Visit: 226lb Weight Lost Since Last Visit: 1lb Weight Gained Since Last Visit: 0 Starting Weight: 239lb Total Weight Loss (lbs): 14 lb (6.35 kg)   Body Composition  Body Fat %: 41.7 % Fat Mass (lbs): 94.2 lbs Muscle Mass (lbs): 125 lbs Total Body Water (lbs): 87.2 lbs Visceral Fat Rating : 10   No data recorded   Objective:   PHYSICAL EXAM: Blood pressure 110/64, pulse 77, temperature 98.3 F (36.8 C), height 5\' 10"  (1.778 m), weight 225 lb (102.1 kg), last menstrual period 08/10/2022, SpO2 97 %. Body mass index is 32.28 kg/m.  General: Well Developed, well  nourished, and in no acute distress.  HEENT: Normocephalic, atraumatic Skin: Warm and dry, cap RF less 2 sec, good turgor Chest:  Normal excursion, shape, no gross abn Respiratory: speaking in full sentences, no conversational dyspnea NeuroM-Sk: Ambulates w/o assistance, moves * 4 Psych: A and O *3, insight good, mood-full  DIAGNOSTIC DATA REVIEWED:  BMET    Component Value Date/Time   NA 140 05/22/2022 0854   K 4.3 05/22/2022 0854   CL 104 05/22/2022 0854   CO2 21 05/22/2022 0854   GLUCOSE 98 05/22/2022 0854   GLUCOSE 93 06/29/2014 1011   BUN 11 05/22/2022 0854   CREATININE 0.69 05/22/2022 0854   CREATININE 0.60 06/29/2014 1011   CALCIUM 9.5 05/22/2022 0854   GFRNONAA 108 02/24/2019 1105   GFRAA 124 02/24/2019 1105   Lab Results  Component Value Date   HGBA1C 5.7 (H) 05/22/2022   HGBA1C 5.8 (H) 06/29/2014   Lab Results  Component Value Date   INSULIN 14.5 05/22/2022   INSULIN 5.8 05/20/2018   Lab Results  Component Value Date   TSH 0.701 05/22/2022   CBC    Component Value Date/Time   WBC 7.6 05/22/2022 0854  WBC 8.6 06/29/2014 1011   RBC 4.17 05/22/2022 0854   RBC 4.25 06/29/2014 1011   HGB 13.2 05/22/2022 0854   HCT 39.4 05/22/2022 0854   PLT 215 05/22/2022 0854   MCV 95 05/22/2022 0854   MCH 31.7 05/22/2022 0854   MCH 31.3 06/29/2014 1011   MCHC 33.5 05/22/2022 0854   MCHC 33.9 06/29/2014 1011   RDW 13.3 05/22/2022 0854   Iron Studies    Component Value Date/Time   IRON 149 09/25/2016 1336   TIBC 277 09/25/2016 1336   FERRITIN 32 09/25/2016 1336   IRONPCTSAT 54 09/25/2016 1336   Lipid Panel     Component Value Date/Time   CHOL 173 05/22/2022 0854   TRIG 49 05/22/2022 0854   HDL 57 05/22/2022 0854   CHOLHDL 3.2 08/25/2020 0921   CHOLHDL 3.2 06/29/2014 1011   VLDL 13 06/29/2014 1011   LDLCALC 106 (H) 05/22/2022 0854   Hepatic Function Panel     Component Value Date/Time   PROT 7.3 05/22/2022 0854   ALBUMIN 4.6 05/22/2022 0854   AST  12 05/22/2022 0854   ALT 11 05/22/2022 0854   ALKPHOS 81 05/22/2022 0854   BILITOT 0.4 05/22/2022 0854      Component Value Date/Time   TSH 0.701 05/22/2022 0854   Nutritional Lab Results  Component Value Date   VD25OH 32.9 05/22/2022   VD25OH 38.0 08/25/2020   VD25OH 27.8 (L) 02/24/2019    Attestations:   Reviewed by clinician on day of visit: allergies, medications, problem list, medical history, surgical history, family history, social history, and previous encounter notes.    I,Special Puri,acting as a Neurosurgeon for [employer], DO.,have documented all relevant documentation on the behalf of Thomasene Lot, DO,as directed by  Thomasene Lot, DO while in the presence of Thomasene Lot, DO.   I, Thomasene Lot, DO, have reviewed all documentation for this visit. The documentation on 08/26/22 for the exam, diagnosis, procedures, and orders are all accurate and complete.

## 2022-09-12 ENCOUNTER — Ambulatory Visit (INDEPENDENT_AMBULATORY_CARE_PROVIDER_SITE_OTHER): Payer: Commercial Managed Care - PPO | Admitting: Family Medicine

## 2022-09-13 ENCOUNTER — Other Ambulatory Visit: Payer: Self-pay

## 2022-09-13 ENCOUNTER — Other Ambulatory Visit (HOSPITAL_BASED_OUTPATIENT_CLINIC_OR_DEPARTMENT_OTHER): Payer: Self-pay

## 2022-09-13 MED ORDER — ATOVAQUONE-PROGUANIL HCL 250-100 MG PO TABS
1.0000 | ORAL_TABLET | Freq: Every day | ORAL | 0 refills | Status: DC
  Filled 2022-09-13: qty 24, 24d supply, fill #0

## 2022-09-13 MED ORDER — AZITHROMYCIN 500 MG PO TABS
ORAL_TABLET | ORAL | 0 refills | Status: DC
  Filled 2022-09-13: qty 4, 3d supply, fill #0

## 2022-09-16 ENCOUNTER — Other Ambulatory Visit (HOSPITAL_BASED_OUTPATIENT_CLINIC_OR_DEPARTMENT_OTHER): Payer: Self-pay

## 2022-09-16 ENCOUNTER — Other Ambulatory Visit: Payer: Self-pay

## 2022-09-17 ENCOUNTER — Other Ambulatory Visit (HOSPITAL_BASED_OUTPATIENT_CLINIC_OR_DEPARTMENT_OTHER): Payer: Self-pay

## 2022-09-24 DIAGNOSIS — L821 Other seborrheic keratosis: Secondary | ICD-10-CM | POA: Diagnosis not present

## 2022-09-24 DIAGNOSIS — L659 Nonscarring hair loss, unspecified: Secondary | ICD-10-CM | POA: Diagnosis not present

## 2022-09-24 DIAGNOSIS — L814 Other melanin hyperpigmentation: Secondary | ICD-10-CM | POA: Diagnosis not present

## 2022-09-24 DIAGNOSIS — L738 Other specified follicular disorders: Secondary | ICD-10-CM | POA: Diagnosis not present

## 2022-09-24 DIAGNOSIS — D225 Melanocytic nevi of trunk: Secondary | ICD-10-CM | POA: Diagnosis not present

## 2022-09-24 DIAGNOSIS — D1801 Hemangioma of skin and subcutaneous tissue: Secondary | ICD-10-CM | POA: Diagnosis not present

## 2022-09-30 ENCOUNTER — Ambulatory Visit (INDEPENDENT_AMBULATORY_CARE_PROVIDER_SITE_OTHER): Payer: Commercial Managed Care - PPO | Admitting: Family Medicine

## 2022-10-17 ENCOUNTER — Ambulatory Visit (INDEPENDENT_AMBULATORY_CARE_PROVIDER_SITE_OTHER): Payer: Commercial Managed Care - PPO | Admitting: Family Medicine

## 2022-11-08 DIAGNOSIS — Z1231 Encounter for screening mammogram for malignant neoplasm of breast: Secondary | ICD-10-CM | POA: Diagnosis not present

## 2022-11-08 LAB — HM MAMMOGRAPHY

## 2022-11-12 ENCOUNTER — Other Ambulatory Visit (HOSPITAL_COMMUNITY)
Admission: RE | Admit: 2022-11-12 | Discharge: 2022-11-12 | Disposition: A | Discharge status: Home / Self Care | Payer: Commercial Managed Care - PPO | Source: Ambulatory Visit | Attending: Nurse Practitioner | Admitting: Nurse Practitioner

## 2022-11-12 ENCOUNTER — Ambulatory Visit (INDEPENDENT_AMBULATORY_CARE_PROVIDER_SITE_OTHER): Payer: Commercial Managed Care - PPO | Admitting: Nurse Practitioner

## 2022-11-12 ENCOUNTER — Telehealth (HOSPITAL_BASED_OUTPATIENT_CLINIC_OR_DEPARTMENT_OTHER): Payer: Self-pay | Admitting: *Deleted

## 2022-11-12 ENCOUNTER — Encounter: Payer: Self-pay | Admitting: Nurse Practitioner

## 2022-11-12 DIAGNOSIS — E559 Vitamin D deficiency, unspecified: Secondary | ICD-10-CM | POA: Diagnosis not present

## 2022-11-12 DIAGNOSIS — E782 Mixed hyperlipidemia: Secondary | ICD-10-CM

## 2022-11-12 DIAGNOSIS — Z124 Encounter for screening for malignant neoplasm of cervix: Secondary | ICD-10-CM

## 2022-11-12 DIAGNOSIS — E041 Nontoxic single thyroid nodule: Secondary | ICD-10-CM

## 2022-11-12 DIAGNOSIS — R011 Cardiac murmur, unspecified: Secondary | ICD-10-CM | POA: Diagnosis not present

## 2022-11-12 DIAGNOSIS — Z1231 Encounter for screening mammogram for malignant neoplasm of breast: Secondary | ICD-10-CM | POA: Diagnosis not present

## 2022-11-12 DIAGNOSIS — Z0000 Encounter for general adult medical examination without abnormal findings: Secondary | ICD-10-CM

## 2022-11-12 DIAGNOSIS — E538 Deficiency of other specified B group vitamins: Secondary | ICD-10-CM | POA: Diagnosis not present

## 2022-11-12 DIAGNOSIS — Z23 Encounter for immunization: Secondary | ICD-10-CM

## 2022-11-12 DIAGNOSIS — R7303 Prediabetes: Secondary | ICD-10-CM

## 2022-11-12 MED ORDER — ZOSTER VAC RECOMB ADJUVANTED 50 MCG/0.5ML IM SUSR: 0.5000 mL | Freq: Once | INTRAMUSCULAR | 1 refills | Status: AC

## 2022-11-12 NOTE — Assessment & Plan Note (Signed)
Resolved

## 2022-11-12 NOTE — Progress Notes (Signed)
Cassie Clamp, DNP, AGNP-c [facility] [address] [place], [place] [address] Main Office [phone]  There were no vitals taken for this visit.   Subjective:    Patient ID: Cassie [name], female    DOB: Aug 06, 1970, 52 y.o.   MRN: [id number]  HPI: Cassie [name] is a 52 y.o. female presenting on 11/12/2022 for comprehensive medical examination.   Current medical concerns include:none   A comprehensive review of systems was negative.  IMMUNIZATIONS:   Flu: Flu vaccine postponed until flu season Prevnar 13: Prevnar 13 N/A for this patient Prevnar 20: Prevnar 20 N/A for this patient Pneumovax 23: Pneumovax 23 N/A for this patient Vac Shingrix: Shingrix N/A for this patient HPV: HPV N/A for this patient Tetanus: Tetanus completed in the last 10 years COVID: COVID completed, documentation in chart   HEALTH MAINTENANCE: Pap Smear HM Status: was completed today Mammogram HM Status: is up to date Colon Cancer Screening HM Status: is up to date Bone Density HM Status: is not applicable for this patient STI Testing HM Status: is up to date Lung CT HM Status: is not applicable for this patient  She denies concerns with hearing, vision, or dentition.  She reports eating a regular diet with no limitations. She is working with [facility] She exercises weekly  Most Recent Depression Screen:     11/09/2020    7:44 AM 08/04/2020    2:03 PM 05/20/2018   10:29 AM  Depression screen PHQ 2/9  Decreased Interest 1 0 1  Down, Depressed, Hopeless 0 0 1  PHQ - 2 Score 1 0 2  Altered sleeping 0  1  Tired, decreased energy 2  3  Change in appetite 2  2  Feeling bad or failure about yourself  1  1  Trouble concentrating 0  0  Moving slowly or fidgety/restless 0  0  Suicidal thoughts 0  0  PHQ-9 Score 6  9  Difficult doing work/chores Not difficult at all  Not difficult at all   Most Recent Anxiety Screen:      No data to display         Most Recent Fall  Screen:    08/04/2020    2:19 PM  Fall Risk   Falls in the past year? 0  Number falls in past yr: 0  Injury with Fall? 0  Risk for fall due to : No Fall Risks  Follow up Falls evaluation completed    Past medical history, surgical history, medications, allergies, family history and social history reviewed with patient today and changes made to appropriate areas of the chart.  Past Medical History:  Past Medical History:  Diagnosis Date   Back pain    GERD (gastroesophageal reflux disease)    with certain foods/certain times of day   Infertility, female    Joint pain    Menorrhagia with regular cycle 09/25/2016   Murmur, cardiac 11/15/2022   Obesity, Class II, BMI 35-39.9 04/02/2015   Plantar fascia syndrome    Pre-diabetes    Thyroid disease    thyroid nodules-bx   Medications:  Current Outpatient Medications on File Prior to Visit  Medication Sig   Cholecalciferol (VITAMIN D3) 125 MCG (5000 UT) TABS 5,000 IU OTC vitamin D3 daily.   cyanocobalamin (VITAMIN B12) 500 MCG tablet Take 1 tablet (500 mcg total) by mouth daily.   No current facility-administered medications on file prior to visit.   Surgical History:  Past Surgical History:  Procedure Laterality  Date   BREAST BIOPSY Left 08/2017   HYSTEROSCOPY  2012   ivf   2012   KNEE ARTHROSCOPY Right 2006   Allergies:  No Known Allergies Family History:  Family History  Problem Relation Age of Onset   Cancer Mother    High Cholesterol Mother    Diabetes Father    Hypertension Father    Cancer Father    Kidney disease Father    Cancer Maternal Aunt    Colon cancer Neg Hx    Colon polyps Neg Hx    Esophageal cancer Neg Hx    Rectal cancer Neg Hx    Stomach cancer Neg Hx        Objective:    There were no vitals taken for this visit.  Wt Readings from Last 3 Encounters:  08/26/22 225 lb (102.1 kg)  08/07/22 226 lb (102.5 kg)  07/23/22 227 lb (103 kg)    Physical Exam Vitals and nursing note reviewed.  Exam conducted with a chaperone present.  Constitutional:      General: She is not in acute distress.    Appearance: Normal appearance.  HENT:     Head: Normocephalic and atraumatic.     Right Ear: Hearing, tympanic membrane, ear canal and external ear normal.     Left Ear: Hearing, tympanic membrane, ear canal and external ear normal.     Nose: Nose normal.     Right Sinus: No maxillary sinus tenderness or frontal sinus tenderness.     Left Sinus: No maxillary sinus tenderness or frontal sinus tenderness.     Mouth/Throat:     Lips: Pink.     Mouth: Mucous membranes are moist.     Pharynx: Oropharynx is clear.  Eyes:     General: Lids are normal. Vision grossly intact.     Extraocular Movements: Extraocular movements intact.     Conjunctiva/sclera: Conjunctivae normal.     Pupils: Pupils are equal, round, and reactive to light.     Funduscopic exam:    Right eye: No hemorrhage. Red reflex present.        Left eye: No hemorrhage. Red reflex present.    Visual Fields: Right eye visual fields normal and left eye visual fields normal.  Neck:     Thyroid: No thyromegaly.     Vascular: No carotid bruit.  Cardiovascular:     Rate and Rhythm: Normal rate and regular rhythm.     Chest Wall: PMI is not displaced.     Pulses: Normal pulses.     Heart sounds: Normal heart sounds. No murmur heard. Pulmonary:     Effort: Pulmonary effort is normal. No respiratory distress.     Breath sounds: Normal breath sounds.  Chest:     Chest wall: No mass, deformity or tenderness.  Breasts:    Breasts are symmetrical.     Right: Normal.     Left: Normal.  Abdominal:     General: Bowel sounds are normal. There is no distension or abdominal bruit.     Palpations: Abdomen is soft. There is no hepatomegaly, splenomegaly or mass.     Tenderness: There is no abdominal tenderness. There is no right CVA tenderness, left CVA tenderness or guarding.     Hernia: No hernia is present. There is no hernia in  the left inguinal area or right inguinal area.  Genitourinary:    General: Normal vulva.     Exam position: Lithotomy position.     Pubic Area:  No rash.      Tanner stage (genital): 5.     Labia:        Right: No rash or tenderness.        Left: No rash.      Urethra: No prolapse or urethral swelling.     Vagina: Normal. No vaginal discharge.     Cervix: Lesion present.     Uterus: Normal.      Adnexa: Right adnexa normal and left adnexa normal.     Rectum: Normal.     Musculoskeletal:        General: Normal range of motion.     Cervical back: Full passive range of motion without pain, normal range of motion and neck supple. No tenderness.     Right lower leg: No edema.     Left lower leg: No edema.  Feet:     Right foot:     Skin integrity: Skin integrity normal.     Toenail Condition: Right toenails are normal.     Left foot:     Skin integrity: Skin integrity normal.     Toenail Condition: Left toenails are normal.  Lymphadenopathy:     Cervical: No cervical adenopathy.     Upper Body:     Right upper body: No supraclavicular, axillary or pectoral adenopathy.     Left upper body: No supraclavicular, axillary or pectoral adenopathy.     Lower Body: No right inguinal adenopathy.  Skin:    General: Skin is warm and dry.     Capillary Refill: Capillary refill takes less than 2 seconds.     Nails: There is no clubbing.  Neurological:     General: No focal deficit present.     Mental Status: She is alert and oriented to person, place, and time.     Cranial Nerves: No cranial nerve deficit.     Sensory: Sensation is intact. No sensory deficit.     Motor: Motor function is intact. No weakness.     Coordination: Coordination is intact. Coordination normal.     Gait: Gait is intact.  Psychiatric:        Attention and Perception: Attention normal.        Mood and Affect: Mood normal.        Speech: Speech normal.        Behavior: Behavior normal. Behavior is cooperative.         Thought Content: Thought content normal.        Cognition and Memory: Cognition and memory normal.        Judgment: Judgment normal.     Results for orders placed or performed in visit on 11/12/22  Cytology - PAP([place])  Result Value Ref Range   High risk HPV Negative    Adequacy      Satisfactory for evaluation; transformation zone component PRESENT.   Diagnosis      - Negative for intraepithelial lesion or malignancy (NILM)   Comment Normal Reference Range HPV - Negative          Assessment & Plan:   Problem List Items Addressed This Visit     Vitamin D deficiency    Labs pending.       Vitamin B 12 deficiency    Labs pending. Oral replacement at this time.       Moderate mixed hyperlipidemia not requiring statin therapy    Labs for monitoring. Diet and exercise recommendations.       Murmur,  cardiac    Murmur detected on exam. Unclear if this is new or chronic. Will send for further evaluation. With ECHO.      Relevant Orders   ECHOCARDIOGRAM COMPLETE   Thyroid nodule    Stable. No changes. Previous bx with no concerns present. Will continue to monitor.       Encounter for annual physical exam    CPE completed today. Review of HM activities and recommendations discussed and provided on AVS. Anticipatory guidance, diet, and exercise recommendations provided. Medications, allergies, and hx reviewed and updated as necessary. Orders placed as listed below.  Plan: - Labs discussed- ordered with [facility] recently. Will make changes as necessary based on results.  - I will review these results and send recommendations via MyChart or a telephone call.  - F/U with CPE in 1 year or sooner for acute/chronic health needs as directed.        Prediabetes    Resolved      Other Visit Diagnoses     Need for shingles vaccine    -  Primary   Screening mammogram for breast cancer       Papanicolaou smear for cervical cancer screening       Relevant Orders    Cytology - PAP([place]) (Completed)          Follow up plan: Return in about 1 year (around 11/12/2023) for CPE.  NEXT PREVENTATIVE PHYSICAL DUE IN 1 YEAR.  PATIENT COUNSELING PROVIDED FOR ALL ADULT PATIENTS: A well balanced diet low in saturated fats, cholesterol, and moderation in carbohydrates.  This can be as simple as monitoring portion sizes and cutting back on sugary beverages such as soda and juice to start with.    Daily water consumption of at least 64 ounces.  Physical activity at least 180 minutes per week.  If just starting out, start 10 minutes a day and work your way up.   This can be as simple as taking the stairs instead of the elevator and walking 2-3 laps around the office  purposefully every day.   STD protection, partner selection, and regular testing if high risk.  Limited consumption of alcoholic beverages if alcohol is consumed. For men, I recommend no more than 14 alcoholic beverages per week, spread out throughout the week (max 2 per day). Avoid "binge" drinking or consuming large quantities of alcohol in one setting.  Please let me know if you feel you may need help with reduction or quitting alcohol consumption.   Avoidance of nicotine, if used. Please let me know if you feel you may need help with reduction or quitting nicotine use.   Daily mental health attention. This can be in the form of 5 minute daily meditation, prayer, journaling, yoga, reflection, etc.  Purposeful attention to your emotions and mental state can significantly improve your overall wellbeing  and  Health.  Please know that I am here to help you with all of your health care goals and am happy to work with you to find a solution that works best for you.  The greatest advice I have received with any changes in life are to take it one step at a time, that even means if all you can focus on is the next 60 seconds, then do that and celebrate your victories.  With any changes in  life, you will have set backs, and that is OK. The important thing to remember is, if you have a set back, it is not a  failure, it is an opportunity to try again! Screening Testing Mammogram Every 1 -2 years based on history and risk factors Starting at age 10 Pap Smear Ages 21-39 every 3 years Ages 30-65 every 5 years with HPV testing More frequent testing may be required based on results and history Colon Cancer Screening Every 1-10 years based on test performed, risk factors, and history Starting at age 51 Bone Density Screening Every 2-10 years based on history Starting at age 74 for women Recommendations for men differ based on medication usage, history, and risk factors AAA Screening One time ultrasound Men 72-40 years old who have every smoked Lung Cancer Screening Low Dose Lung CT every 12 months Age 69-80 years with a 30 pack-year smoking history who still smoke or who have quit within the last 15 years   Screening Labs Routine  Labs: Complete Blood Count (CBC), Complete Metabolic Panel (CMP), Cholesterol (Lipid Panel) Every 6-12 months based on history and medications May be recommended more frequently based on current conditions or previous results Hemoglobin A1c Lab Every 3-12 months based on history and previous results Starting at age 56 or earlier with diagnosis of diabetes, high cholesterol, BMI >26, and/or risk factors Frequent monitoring for patients with diabetes to ensure blood sugar control Thyroid Panel (TSH) Every 6 months based on history, symptoms, and risk factors May be repeated more often if on medication HIV One time testing for all patients 109 and older May be repeated more frequently for patients with increased risk factors or exposure Hepatitis C One time testing for all patients 51 and older May be repeated more frequently for patients with increased risk factors or exposure Gonorrhea, Chlamydia Every 12 months for all sexually active persons  13-24 years Additional monitoring may be recommended for those who are considered high risk or who have symptoms Every 12 months for any woman on birth control, regardless of sexual activity PSA Men 39-67 years old with risk factors Additional screening may be recommended from age 49-69 based on risk factors, symptoms, and history  Vaccine Recommendations Tetanus Booster All adults every 10 years Flu Vaccine All patients 6 months and older every year COVID Vaccine All patients 12 years and older Initial dosing with booster May recommend additional booster based on age and health history HPV Vaccine 2 doses all patients age 78-26 Dosing may be considered for patients over 26 Shingles Vaccine (Shingrix) 2 doses all adults 55 years and older Pneumonia (Pneumovax 78) All adults 65 years and older May recommend earlier dosing based on health history One year apart from Prevnar 45 Pneumonia (Prevnar 29) All adults 65 years and older Dosed 1 year after Pneumovax 23 Pneumonia (Prevnar 20) One time alternative to the two dosing of 13 and 23 For all adults with initial dose of 23, 20 is recommended 1 year later For all adults with initial dose of 13, 23 is still recommended as second option 1 year later

## 2022-11-12 NOTE — Telephone Encounter (Signed)
Patient states she is with a patient and will call back to schedule her Echocardiogram

## 2022-11-12 NOTE — Patient Instructions (Signed)
Shingles I have printed orders for your Shingles vaccine. This is a two shot series, usually completed 2-6 months apart. I recommend planning for this on a day that you have 2 days to rest after (ex: on a Friday). This vaccine can make some people tired, achy, or feel unwell. The vaccine is vital to help reduce the risk of shingles in adults age 51 and older.   You can take the prescription to a pharmacy of your choice to have this completed.    For all adult patients, I recommend A well balanced diet low in saturated fats, cholesterol, and moderation in carbohydrates.   This can be as simple as monitoring portion sizes and cutting back on sugary beverages such as soda and juice to start with.    Aim for 130-150 grams of carbohydrates, 70-90 grams of protein, 30 grams of fiber, and no more than 13 grams of saturated fat for the average adult.   Daily water consumption of at least 64 ounces.  Physical activity at least 180 minutes per week, if just starting out.   This can be as simple as taking the stairs instead of the elevator and walking 2-3 laps around the office purposefully every day.   STD protection, partner selection, and regular testing if high risk. I recommend annual testing for sexually active adults who are considered low risk and more frequent testing if you are higher risk (ie: multiple sexual partners, open relationship, etc)  Limited consumption of alcoholic beverages if alcohol is consumed.  For women, I recommend no more than 7 alcoholic beverages per week, for men no more than 14 alcoholic beverages per week, spread out throughout the week.  Avoid "binge" drinking or consuming 3 or more alcoholic beverages in one setting.   Please let me know if you feel you may need help with reduction or quitting alcohol consumption.   Avoidance of nicotine, if used.  Please let me know if you feel you may need help with reduction or quitting nicotine use.   Daily mental health  attention.  This can be in the form of 5 minute daily meditation, prayer, journaling, yoga, reflection, etc.   Purposeful attention to your emotions and mental state can significantly improve your overall wellbeing and health.  Please know that I am here to help you with all of your health care goals and am happy to work with you to find a solution that works best for you.  The greatest advice I have received with any changes in life are to take it one step at a time, that even means if all you can focus on is the next 60 seconds, then do that and celebrate your victories.  With any changes in life, you will have set backs, and that is OK. The important thing to remember is, if you have a set back, it is not a failure, it is an opportunity to try again!  Health Maintenance Recommendations Screening Testing Mammogram Every 1 -2 years based on history and risk factors Starting at age 54 Pap Smear Ages 21-39 every 3 years Ages 13-65 every 5 years with HPV testing More frequent testing may be required based on results and history Colon Cancer Screening Every 1-10 years based on test performed, risk factors, and history Starting at age 27 Bone Density Screening Every 2-10 years based on history Starting at age 65 for women Recommendations for men differ based on medication usage, history, and risk factors AAA Screening One time ultrasound  Men 2-26 years old who have every smoked Lung Cancer Screening Low Dose Lung CT every 12 months Age 32-80 years with a 30 pack-year smoking history who still smoke or who have quit within the last 15 years  Screening Labs Routine  Labs: Complete Blood Count (CBC), Complete Metabolic Panel (CMP), Cholesterol (Lipid Panel) Every 6-12 months based on history and medications May be recommended more frequently based on current conditions or previous results Hemoglobin A1c Lab Every 3-12 months based on history and previous results Starting at age 32 or  earlier with diagnosis of diabetes, high cholesterol, BMI >26, and/or risk factors Frequent monitoring for patients with diabetes to ensure blood sugar control Thyroid Panel (TSH w/ T3 & T4) Every 6 months based on history, symptoms, and risk factors May be repeated more often if on medication HIV One time testing for all patients 94 and older May be repeated more frequently for patients with increased risk factors or exposure Hepatitis C One time testing for all patients 52 and older May be repeated more frequently for patients with increased risk factors or exposure Gonorrhea, Chlamydia Every 12 months for all sexually active persons 13-24 years Additional monitoring may be recommended for those who are considered high risk or who have symptoms PSA Men 69-49 years old with risk factors Additional screening may be recommended from age 71-69 based on risk factors, symptoms, and history  Vaccine Recommendations Tetanus Booster All adults every 10 years Flu Vaccine All patients 6 months and older every year COVID Vaccine All patients 12 years and older Initial dosing with booster May recommend additional booster based on age and health history HPV Vaccine 2 doses all patients age 43-26 Dosing may be considered for patients over 26 Shingles Vaccine (Shingrix) 2 doses all adults 55 years and older Pneumonia (Pneumovax 23) All adults 65 years and older May recommend earlier dosing based on health history Pneumonia (Prevnar 71) All adults 65 years and older Dosed 1 year after Pneumovax 23  Additional Screening, Testing, and Vaccinations may be recommended on an individualized basis based on family history, health history, risk factors, and/or exposure.

## 2022-11-12 NOTE — Assessment & Plan Note (Signed)
CPE completed today. Review of HM activities and recommendations discussed and provided on AVS. Anticipatory guidance, diet, and exercise recommendations provided. Medications, allergies, and hx reviewed and updated as necessary. Orders placed as listed below.  Plan: - Labs discussed- ordered with [facility] recently. Will make changes as necessary based on results.  - I will review these results and send recommendations via MyChart or a telephone call.  - F/U with CPE in 1 year or sooner for acute/chronic health needs as directed.

## 2022-11-12 NOTE — Assessment & Plan Note (Addendum)
Stable. No changes. Previous bx with no concerns present. Will continue to monitor.

## 2022-11-13 LAB — CYTOLOGY - PAP
Comment: NEGATIVE
Diagnosis: NEGATIVE
High risk HPV: NEGATIVE

## 2022-11-15 ENCOUNTER — Encounter: Payer: Self-pay | Admitting: Nurse Practitioner

## 2022-11-15 DIAGNOSIS — R011 Cardiac murmur, unspecified: Secondary | ICD-10-CM | POA: Insufficient documentation

## 2022-11-15 HISTORY — DX: Cardiac murmur, unspecified: R01.1

## 2022-11-15 NOTE — Assessment & Plan Note (Signed)
Murmur detected on exam. Unclear if this is new or chronic. Will send for further evaluation. With ECHO.

## 2022-11-15 NOTE — Assessment & Plan Note (Signed)
Labs pending. Oral replacement at this time.

## 2022-11-15 NOTE — Assessment & Plan Note (Signed)
Labs pending.  

## 2022-11-15 NOTE — Assessment & Plan Note (Signed)
Labs for monitoring. Diet and exercise recommendations.

## 2022-11-27 ENCOUNTER — Encounter: Payer: Commercial Managed Care - PPO | Admitting: Nurse Practitioner

## 2022-12-13 ENCOUNTER — Ambulatory Visit (HOSPITAL_BASED_OUTPATIENT_CLINIC_OR_DEPARTMENT_OTHER): Payer: Commercial Managed Care - PPO

## 2022-12-13 DIAGNOSIS — R011 Cardiac murmur, unspecified: Secondary | ICD-10-CM

## 2022-12-13 LAB — ECHOCARDIOGRAM COMPLETE
AR max vel: 1.99 cm2
AV Area VTI: 2 cm2
AV Area mean vel: 1.94 cm2
AV Mean grad: 3 mm[Hg]
AV Peak grad: 7.2 mm[Hg]
Ao pk vel: 1.34 m/s
Area-P 1/2: 2.63 cm2
S' Lateral: 3.04 cm

## 2023-03-28 ENCOUNTER — Other Ambulatory Visit (HOSPITAL_BASED_OUTPATIENT_CLINIC_OR_DEPARTMENT_OTHER): Payer: Self-pay

## 2023-03-28 MED ORDER — ZOSTER VAC RECOMB ADJUVANTED 50 MCG/0.5ML IM SUSR
0.5000 mL | Freq: Once | INTRAMUSCULAR | 0 refills | Status: AC
  Filled 2023-03-28: qty 0.5, 1d supply, fill #0

## 2023-08-29 ENCOUNTER — Other Ambulatory Visit (HOSPITAL_BASED_OUTPATIENT_CLINIC_OR_DEPARTMENT_OTHER): Payer: Self-pay

## 2023-08-29 MED ORDER — ZOSTER VAC RECOMB ADJUVANTED 50 MCG/0.5ML IM SUSR
0.5000 mL | Freq: Once | INTRAMUSCULAR | 0 refills | Status: AC
  Filled 2023-08-29: qty 0.5, 1d supply, fill #0

## 2023-09-03 ENCOUNTER — Other Ambulatory Visit (HOSPITAL_BASED_OUTPATIENT_CLINIC_OR_DEPARTMENT_OTHER): Payer: Self-pay

## 2023-09-19 ENCOUNTER — Encounter (HOSPITAL_COMMUNITY): Payer: Self-pay

## 2023-09-19 ENCOUNTER — Ambulatory Visit (INDEPENDENT_AMBULATORY_CARE_PROVIDER_SITE_OTHER)

## 2023-09-19 ENCOUNTER — Ambulatory Visit (HOSPITAL_COMMUNITY): Payer: Self-pay

## 2023-09-19 ENCOUNTER — Ambulatory Visit (HOSPITAL_COMMUNITY)
Admission: EM | Admit: 2023-09-19 | Discharge: 2023-09-19 | Disposition: A | Discharge status: Home / Self Care | Source: Ambulatory Visit | Attending: Physician Assistant | Admitting: Physician Assistant

## 2023-09-19 DIAGNOSIS — R0789 Other chest pain: Secondary | ICD-10-CM

## 2023-09-19 DIAGNOSIS — U071 COVID-19: Secondary | ICD-10-CM | POA: Insufficient documentation

## 2023-09-19 LAB — BASIC METABOLIC PANEL WITH GFR
Anion gap: 8 (ref 5–15)
BUN: 9 mg/dL (ref 6–20)
CO2: 25 mmol/L (ref 22–32)
Calcium: 9.1 mg/dL (ref 8.9–10.3)
Chloride: 105 mmol/L (ref 98–111)
Creatinine, Ser: 0.71 mg/dL (ref 0.44–1.00)
GFR, Estimated: >60 mL/min (ref >60)
Glucose, Bld: 97 mg/dL (ref 70–99)
Potassium: 4.4 mmol/L (ref 3.5–5.1)
Sodium: 138 mmol/L (ref 135–145)

## 2023-09-19 LAB — POC COVID19/FLU A&B COMBO
Covid Antigen, POC: POSITIVE — AB
Influenza A Antigen, POC: NEGATIVE
Influenza B Antigen, POC: NEGATIVE

## 2023-09-19 MED ORDER — ALBUTEROL SULFATE HFA 108 (90 BASE) MCG/ACT IN AERS: 1.0000 | INHALATION_SPRAY | Freq: Four times a day (QID) | RESPIRATORY_TRACT | 0 refills | Status: DC | PRN

## 2023-09-19 MED ORDER — PAXLOVID (300/100) 20 X 150 MG & 10 X 100MG PO TBPK
3.0000 | ORAL_TABLET | Freq: Two times a day (BID) | ORAL | 0 refills | Status: AC
Start: 2023-09-19 — End: 2023-09-24

## 2023-09-19 MED ORDER — PROMETHAZINE-DM 6.25-15 MG/5ML PO SYRP: 5.0000 mL | ORAL_SOLUTION | Freq: Two times a day (BID) | ORAL | 0 refills | Status: DC | PRN

## 2023-09-19 NOTE — Discharge Instructions (Signed)
 Your EKG was normal.  Your chest x-ray did not show any evidence of pneumonia but I will contact you if the radiologist sees something that I did not see.  You did test positive for COVID.  We are going to start Paxlovid  assuming that your kidney function is normal but I will contact you if your metabolic panel is abnormal and we need to adjust the dose of this medicine.  Use over-the-counter medications including Tylenol  or Profen for pain.  Take Promethazine  DM for cough.  This will make you sleepy do not drive drink alcohol with taking it.  Use albuterol  every 4-6 hours as needed.  Monitor your oxygen saturation and if this drops significantly you should go to the emergency room.  If you are not feeling better in a week or if anything worsens you need to be seen immediately.

## 2023-09-19 NOTE — ED Provider Notes (Signed)
 [facility]    CSN: [id number] Arrival date & time: 09/19/23  1130      History   Chief Complaint Chief Complaint  Patient presents with   Cough    HPI Cassie [name] is a 53 y.o. female.   Patient presents today with a several day history of URI symptoms.  Reports congestion, mild cough, chills, low-grade subjective fever.  She initially thought symptoms were related to URI as she has had several family members who have been sick with similar symptoms but then this morning woke up with some chest tightness.  Denies any associated diaphoresis, shortness of breath, nausea, vomiting, pain rating into her jaw or arms.  She does have family history of CVD in second-degree relatives.  Denies any history of smoking, diabetes, hypertension, hyperlipidemia.  She describes a sensation as a chest tightness but it did resolve after she took some antacid medication.  She has been coughing and thinks that that might of triggered it but just wanted to make sure there was nothing else going on.  She has had COVID-19 vaccines.  She denies any history of asthma, smoking, COPD but has needed albuterol  inhaler with illness in the past.  She does not currently have 1 this was not used once since her symptoms began.    Past Medical History:  Diagnosis Date   Back pain    GERD (gastroesophageal reflux disease)    with certain foods/certain times of day   Infertility, female    Joint pain    Menorrhagia with regular cycle 09/25/2016   Murmur, cardiac 11/15/2022   Obesity, Class II, BMI 35-39.9 04/02/2015   Plantar fascia syndrome    Pre-diabetes    Thyroid  disease    thyroid  nodules-bx    Patient Active Problem List   Diagnosis Date Noted   Murmur, cardiac 11/15/2022   Moderate mixed hyperlipidemia not requiring statin therapy 11/12/2022   Vitamin B 12 deficiency 06/05/2022   Class 1 obesity with serious comorbidity and body mass index (BMI) of 34.0 to 34.9 in adult 05/22/2022    Prediabetes 05/22/2022   Eating disorder 05/22/2022   SI (sacroiliac) joint dysfunction 02/15/2022   Somatic dysfunction of spine, sacral 02/15/2022   Plantar fasciitis of left foot 07/22/2021   Shortness, tendon, Achilles acquired, left 07/09/2021   Encounter for annual physical exam 08/04/2020   Hemorrhoids 08/04/2020   Anxiety 09/23/2018   Thyroid  nodule 01/24/2016   Vitamin D  deficiency 01/24/2016    Past Surgical History:  Procedure Laterality Date   BREAST BIOPSY Left 08/2017   HYSTEROSCOPY  2012   ivf   2012   KNEE ARTHROSCOPY Right 2006    OB History     Gravida  1   Para  1   Term  1   Preterm      AB  0   Living  1      SAB  0   IAB      Ectopic      Multiple      Live Births  1            Home Medications    Prior to Admission medications   Medication Sig Start Date End Date Taking? Authorizing Provider  albuterol  (VENTOLIN  HFA) 108 (90 Base) MCG/ACT inhaler Inhale 1-2 puffs into the lungs every 6 (six) hours as needed for wheezing or shortness of breath. 09/19/23  Yes Winda Summerall, Rocky POUR, PA-C  nirmatrelvir/ritonavir (PAXLOVID , 300/100,) 20 x 150 MG &  10 x 100MG  TBPK Take 3 tablets by mouth 2 (two) times daily for 5 days. Patient GFR is 106. Take nirmatrelvir (150 mg) two tablets twice daily for 5 days and ritonavir (100 mg) one tablet twice daily for 5 days. 09/19/23 09/24/23 Yes Deundra Bard K, PA-C  promethazine -dextromethorphan (PROMETHAZINE -DM) 6.25-15 MG/5ML syrup Take 5 mLs by mouth 2 (two) times daily as needed for cough. 09/19/23  Yes Itali Mckendry, Rocky POUR, PA-C    Family History Family History  Problem Relation Age of Onset   Cancer Mother    High Cholesterol Mother    Diabetes Father    Hypertension Father    Cancer Father    Kidney disease Father    Cancer Maternal Aunt    Colon cancer Neg Hx    Colon polyps Neg Hx    Esophageal cancer Neg Hx    Rectal cancer Neg Hx    Stomach cancer Neg Hx     Social History Social History    Tobacco Use   Smoking status: Never    Passive exposure: Never   Smokeless tobacco: Never  Substance Use Topics   Alcohol use: No   Drug use: No     Allergies   Patient has no known allergies.   Review of Systems Review of Systems  Constitutional:  Positive for activity change and chills. Negative for appetite change, fatigue and fever.  HENT:  Positive for congestion and sinus pressure.   Respiratory:  Positive for cough and chest tightness. Negative for shortness of breath and wheezing.   Cardiovascular:  Negative for chest pain.  Gastrointestinal:  Negative for nausea and vomiting.     Physical Exam Triage Vital Signs ED Triage Vitals [09/19/23 1159]  Encounter Vitals Group     BP 115/68     Girls Systolic BP Percentile      Girls Diastolic BP Percentile      Boys Systolic BP Percentile      Boys Diastolic BP Percentile      Pulse Rate 68     Resp 16     Temp 98.4 F (36.9 C)     Temp Source Oral     SpO2 94 %     Weight      Height      Head Circumference      Peak Flow      Pain Score      Pain Loc      Pain Education      Exclude from Growth Chart    No data found.  Updated Vital Signs BP 115/68 (BP Location: Right Arm)   Pulse 68   Temp 98.4 F (36.9 C) (Oral)   Resp 16   LMP 08/01/2023 (Approximate)   SpO2 94%   Visual Acuity Right Eye Distance:   Left Eye Distance:   Bilateral Distance:    Right Eye Near:   Left Eye Near:    Bilateral Near:     Physical Exam Vitals reviewed.  Constitutional:      General: She is awake. She is not in acute distress.    Appearance: Normal appearance. She is well-developed. She is not ill-appearing.     Comments: Very pleasant female appears stated age in no acute distress sitting comfortably in exam room  HENT:     Head: Normocephalic and atraumatic.     Right Ear: Tympanic membrane, ear canal and external ear normal. Tympanic membrane is not erythematous or bulging.     Left Ear:  Tympanic  membrane, ear canal and external ear normal. Tympanic membrane is not erythematous or bulging.     Mouth/Throat:     Pharynx: Uvula midline. Postnasal drip present. No oropharyngeal exudate or posterior oropharyngeal erythema.  Cardiovascular:     Rate and Rhythm: Normal rate and regular rhythm.     Heart sounds: Normal heart sounds, S1 normal and S2 normal. No murmur heard. Pulmonary:     Effort: Pulmonary effort is normal.     Breath sounds: Normal breath sounds. No wheezing, rhonchi or rales.     Comments: Clear to auscultation bilaterally Psychiatric:        Behavior: Behavior is cooperative.      UC Treatments / Results  Labs (all labs ordered are listed, but only abnormal results are displayed) Labs Reviewed  POC COVID19/FLU A&B COMBO - Abnormal; Notable for the following components:      Result Value   Covid Antigen, POC Positive (*)    All other components within normal limits  BASIC METABOLIC PANEL WITH GFR    EKG   Radiology DG Chest 2 View Result Date: 09/19/2023 CLINICAL DATA:  Chest tightness EXAM: CHEST - 2 VIEW COMPARISON:  11/22/2021 FINDINGS: The heart size and mediastinal contours are within normal limits. Both lungs are clear. The visualized skeletal structures are unremarkable. IMPRESSION: No active cardiopulmonary disease. Electronically Signed   By: Camellia Candle M.D.   On: 09/19/2023 13:28    Procedures Procedures (including critical care time)  Medications Ordered in UC Medications - No data to display  Initial Impression / Assessment and Plan / UC Course  I have reviewed the triage vital signs and the nursing notes.  Pertinent labs & imaging results that were available during my care of the patient were reviewed by me and considered in my medical decision making (see chart for details).     Patient is well-appearing, afebrile, nontoxic, nontachycardic.  No evidence of acute infection of physical exam that warrant initiation of antibiotics.   Given her associated chest tenderness an EKG was obtained that showed normal sinus rhythm with ventricular rate of 68 bpm without ischemic changes; compared to tracing from 05/22/2022 no significant change.  Chest x-ray was obtained that showed no acute cardiopulmonary disease based on my primary read.  At this time discharge you are waiting for radiologist overread and we will contact her if this differs and changes her treatment plan.  Patient is currently asymptomatic and denies any chest pain or shortness of breath so low suspicion for CAD.  She did test positive for COVID-19.  She is interested in starting Paxlovid  so we will initiate this without renal reduction of dosing as she had normal kidney function 05/22/2022 with EGFR of 106.  Will obtain a metabolic panel and we discussed that if this shows reduced kidney function that requires dose adjustment of medication we will contact her.  She was given Promethazine  DM for cough which can be sedating and she is not to drive or drink alcohol with taking it.  She was given albuterol  inhaler to have on hand.  If she is not improving or if anything worsens she needs to be seen immediately.  Excuse note provided.  Final Clinical Impressions(s) / UC Diagnoses   Final diagnoses:  Chest tightness  COVID-19     Discharge Instructions      Your EKG was normal.  Your chest x-ray did not show any evidence of pneumonia but I will contact you if the radiologist sees  something that I did not see.  You did test positive for COVID.  We are going to start Paxlovid  assuming that your kidney function is normal but I will contact you if your metabolic panel is abnormal and we need to adjust the dose of this medicine.  Use over-the-counter medications including Tylenol  or Profen for pain.  Take Promethazine  DM for cough.  This will make you sleepy do not drive drink alcohol with taking it.  Use albuterol  every 4-6 hours as needed.  Monitor your oxygen saturation and if this  drops significantly you should go to the emergency room.  If you are not feeling better in a week or if anything worsens you need to be seen immediately.     ED Prescriptions     Medication Sig Dispense Auth. Provider   nirmatrelvir/ritonavir (PAXLOVID , 300/100,) 20 x 150 MG & 10 x 100MG  TBPK Take 3 tablets by mouth 2 (two) times daily for 5 days. Patient GFR is 106. Take nirmatrelvir (150 mg) two tablets twice daily for 5 days and ritonavir (100 mg) one tablet twice daily for 5 days. 30 tablet Lyle Niblett K, PA-C   promethazine -dextromethorphan (PROMETHAZINE -DM) 6.25-15 MG/5ML syrup Take 5 mLs by mouth 2 (two) times daily as needed for cough. 118 mL Carri Spillers K, PA-C   albuterol  (VENTOLIN  HFA) 108 (90 Base) MCG/ACT inhaler Inhale 1-2 puffs into the lungs every 6 (six) hours as needed for wheezing or shortness of breath. 16 g Lillyann Ahart K, PA-C      PDMP not reviewed this encounter.   Sherrell Rocky POUR, PA-C 09/19/23 1343

## 2023-09-19 NOTE — ED Triage Notes (Signed)
 Patient here today with c/o productive cough, chest tightness, nasal congestion X 2 days. Last night chills, low grade fever. Took Tums and famotidine with some relief. Covid test at home was negative. Husband had some congestion for a few days. Patient just got back from [place] .

## 2023-09-20 ENCOUNTER — Ambulatory Visit (HOSPITAL_COMMUNITY)

## 2023-10-09 ENCOUNTER — Other Ambulatory Visit (HOSPITAL_BASED_OUTPATIENT_CLINIC_OR_DEPARTMENT_OTHER): Payer: Self-pay

## 2023-10-23 ENCOUNTER — Other Ambulatory Visit (HOSPITAL_BASED_OUTPATIENT_CLINIC_OR_DEPARTMENT_OTHER): Payer: Self-pay

## 2023-10-23 ENCOUNTER — Other Ambulatory Visit: Payer: Self-pay | Admitting: Nurse Practitioner

## 2023-10-23 ENCOUNTER — Encounter: Payer: Self-pay | Admitting: Nurse Practitioner

## 2023-10-23 MED ORDER — SCOPOLAMINE 1 MG/3DAYS TD PT72
1.0000 | MEDICATED_PATCH | TRANSDERMAL | 1 refills | Status: DC
  Filled 2023-10-23: qty 5, 15d supply, fill #0

## 2023-11-05 NOTE — Progress Notes (Unsigned)
Pneumonia

## 2023-11-06 ENCOUNTER — Telehealth (HOSPITAL_COMMUNITY): Payer: Self-pay | Admitting: Lactation Services

## 2023-11-06 ENCOUNTER — Ambulatory Visit (INDEPENDENT_AMBULATORY_CARE_PROVIDER_SITE_OTHER): Admitting: Nurse Practitioner

## 2023-11-06 ENCOUNTER — Other Ambulatory Visit (HOSPITAL_BASED_OUTPATIENT_CLINIC_OR_DEPARTMENT_OTHER): Payer: Self-pay

## 2023-11-06 ENCOUNTER — Encounter: Payer: Self-pay | Admitting: Nurse Practitioner

## 2023-11-06 VITALS — BP 124/80 | HR 80 | Ht 70.0 in | Wt 238.2 lb

## 2023-11-06 DIAGNOSIS — L299 Pruritus, unspecified: Secondary | ICD-10-CM | POA: Diagnosis not present

## 2023-11-06 DIAGNOSIS — I5189 Other ill-defined heart diseases: Secondary | ICD-10-CM

## 2023-11-06 DIAGNOSIS — Z1329 Encounter for screening for other suspected endocrine disorder: Secondary | ICD-10-CM

## 2023-11-06 DIAGNOSIS — Z6834 Body mass index (BMI) 34.0-34.9, adult: Secondary | ICD-10-CM | POA: Diagnosis not present

## 2023-11-06 DIAGNOSIS — Z130 Encounter for screening for diseases of the blood and blood-forming organs and certain disorders involving the immune mechanism: Secondary | ICD-10-CM | POA: Diagnosis not present

## 2023-11-06 DIAGNOSIS — Z0000 Encounter for general adult medical examination without abnormal findings: Secondary | ICD-10-CM

## 2023-11-06 DIAGNOSIS — R011 Cardiac murmur, unspecified: Secondary | ICD-10-CM | POA: Diagnosis not present

## 2023-11-06 DIAGNOSIS — H608X3 Other otitis externa, bilateral: Secondary | ICD-10-CM | POA: Diagnosis not present

## 2023-11-06 DIAGNOSIS — H9313 Tinnitus, bilateral: Secondary | ICD-10-CM | POA: Diagnosis not present

## 2023-11-06 DIAGNOSIS — Z13228 Encounter for screening for other metabolic disorders: Secondary | ICD-10-CM

## 2023-11-06 DIAGNOSIS — E66811 Obesity, class 1: Secondary | ICD-10-CM

## 2023-11-06 DIAGNOSIS — Z1321 Encounter for screening for nutritional disorder: Secondary | ICD-10-CM | POA: Diagnosis not present

## 2023-11-06 DIAGNOSIS — H60543 Acute eczematoid otitis externa, bilateral: Secondary | ICD-10-CM | POA: Insufficient documentation

## 2023-11-06 MED ORDER — ACETIC ACID 2 % OT SOLN
4.0000 [drp] | Freq: Three times a day (TID) | OTIC | 1 refills | Status: DC
  Filled 2023-11-06: qty 15, 13d supply, fill #0

## 2023-11-06 NOTE — Assessment & Plan Note (Signed)
 Chronic tinnitus and sensorineural hearing loss in the right ear with no recent changes in symptoms. High-level hearing loss on the right side. Possible need for further evaluation to rule out conditions  - Refer to ENT for further evaluation

## 2023-11-06 NOTE — Assessment & Plan Note (Signed)
 Increased demands with work have limited ability to exercise over the summer resulting in weight changes. Discussed importance of self care. She has recently hired a Systems analyst and plans to schedule a weekly time for this.  - Great job for scheduling with a physical trainer - Diet recommendations for high fiber (about 25-30 grams a day) and lean protein diet (aim for 70-90 grams a day) with moderate -low carbohydrates (100-150 grams a day) .  Exercise recommendations to aim for 150 minutes of moderate intensity activity a week. This is sustained activity that gets your heart rate up and has you sweat, but still allows you to carry on a conversation.

## 2023-11-06 NOTE — Patient Instructions (Addendum)
 VISIT SUMMARY:  Today, we discussed your recent diagnosis of grade two diastolic dysfunction, your experience with COVID-19, and your ongoing issues with tinnitus and ear eczema. We reviewed your symptoms and medical history to create a comprehensive plan for your health.  YOUR PLAN:  -RIGHT EAR TINNITUS AND SENSORINEURAL HEARING LOSS: Tinnitus is a ringing or buzzing noise in one or both ears that may be constant or come and go, often associated with hearing loss. We recommend seeing an ENT specialist for further evaluation and possible imaging to rule out other conditions such as an acoustic neuroma.  -ECZEMATOUS OTITIS EXTERNA: Eczematous otitis externa is a condition where the skin of the outer ear canal becomes inflamed and itchy. We have prescribed ear drops to help manage the itching and inflammation.  -DIASTOLIC DYSFUNCTION, GRADE 2: Diastolic dysfunction is when the heart has difficulty relaxing and filling with blood, even though the ejection fraction (the percentage of blood leaving the heart each time it contracts) is normal. We will continue to monitor your condition, especially since you have hypertension, which is commonly associated with diastolic dysfunction.  INSTRUCTIONS:  Please follow up with an ENT specialist for your tinnitus and hearing loss evaluation. Use the prescribed ear drops as directed for your ear eczema. Continue monitoring your blood pressure and report any new symptoms such as chest pain, shortness of breath, or dizziness.

## 2023-11-06 NOTE — Assessment & Plan Note (Signed)
 Echo completed earlier this year. Unchanged without symptoms at this time. Sending referral to cardiology for grade ii diastolic dysfunction and mild atrial dilation noted on the exam. Will monitor.

## 2023-11-06 NOTE — Assessment & Plan Note (Signed)
 Grade 2 diastolic dysfunction with normal ejection fraction at 60-65% and mild atrial dilation detected on Echo. No symptoms of chest pain, shortness of breath, or dizziness. Murmur is present. Will send referral to cardiology for her for further evaluation and recommendations. No hypertension with exception of during pregnancy.

## 2023-11-06 NOTE — Telephone Encounter (Signed)
 Mother called and wanted phone number for Lactation OP.  Pain in Right breast, not Mastitis.  Left message if mother needed our assistance.

## 2023-11-06 NOTE — Assessment & Plan Note (Signed)

## 2023-11-06 NOTE — Assessment & Plan Note (Signed)
 Chronic itching in the ears, particularly the right ear, consistent with eczematous otitis externa. - Send trial of acetic acid  for treatment.  - May use as needed for itching. If not effective, please let me know and I can send an alternative.

## 2023-11-07 LAB — CBC WITH DIFFERENTIAL/PLATELET
Basophils Absolute: 0 x10E3/uL (ref 0.0–0.2)
Basos: 0 %
EOS (ABSOLUTE): 0.1 x10E3/uL (ref 0.0–0.4)
Eos: 1 %
Hematocrit: 39.2 % (ref 34.0–46.6)
Hemoglobin: 12.8 g/dL (ref 11.1–15.9)
Immature Grans (Abs): 0 x10E3/uL (ref 0.0–0.1)
Immature Granulocytes: 0 %
Lymphocytes Absolute: 2.4 x10E3/uL (ref 0.7–3.1)
Lymphs: 36 %
MCH: 32.2 pg (ref 26.6–33.0)
MCHC: 32.7 g/dL (ref 31.5–35.7)
MCV: 99 fL — ABNORMAL HIGH (ref 79–97)
Monocytes Absolute: 0.6 x10E3/uL (ref 0.1–0.9)
Monocytes: 10 %
Neutrophils Absolute: 3.6 x10E3/uL (ref 1.4–7.0)
Neutrophils: 53 %
Platelets: 251 x10E3/uL (ref 150–450)
RBC: 3.98 x10E6/uL (ref 3.77–5.28)
RDW: 13 % (ref 11.7–15.4)
WBC: 6.7 x10E3/uL (ref 3.4–10.8)

## 2023-11-07 LAB — HEMOGLOBIN A1C
Est. average glucose Bld gHb Est-mCnc: 108 mg/dL
Hgb A1c MFr Bld: 5.4 % (ref 4.8–5.6)

## 2023-11-07 LAB — CMP14+EGFR
ALT: 16 IU/L (ref 0–32)
AST: 16 IU/L (ref 0–40)
Albumin: 4 g/dL (ref 3.8–4.9)
Alkaline Phosphatase: 77 IU/L (ref 44–121)
BUN/Creatinine Ratio: 21 (ref 9–23)
BUN: 15 mg/dL (ref 6–24)
Bilirubin Total: 0.3 mg/dL (ref 0.0–1.2)
CO2: 21 mmol/L (ref 20–29)
Calcium: 9.4 mg/dL (ref 8.7–10.2)
Chloride: 103 mmol/L (ref 96–106)
Creatinine, Ser: 0.7 mg/dL (ref 0.57–1.00)
Globulin, Total: 2.7 g/dL (ref 1.5–4.5)
Glucose: 101 mg/dL — ABNORMAL HIGH (ref 70–99)
Potassium: 4.3 mmol/L (ref 3.5–5.2)
Sodium: 137 mmol/L (ref 134–144)
Total Protein: 6.7 g/dL (ref 6.0–8.5)
eGFR: 105 mL/min/1.73 (ref >59)

## 2023-11-07 LAB — LIPID PANEL
Chol/HDL Ratio: 3 ratio (ref 0.0–4.4)
Cholesterol, Total: 169 mg/dL (ref 100–199)
HDL: 56 mg/dL (ref >39)
LDL Chol Calc (NIH): 102 mg/dL — ABNORMAL HIGH (ref 0–99)
Triglycerides: 56 mg/dL (ref 0–149)
VLDL Cholesterol Cal: 11 mg/dL (ref 5–40)

## 2023-11-07 LAB — TSH: TSH: 0.903 u[IU]/mL (ref 0.450–4.500)

## 2023-11-12 ENCOUNTER — Ambulatory Visit: Payer: Self-pay | Admitting: Nurse Practitioner

## 2023-11-14 DIAGNOSIS — Z1231 Encounter for screening mammogram for malignant neoplasm of breast: Secondary | ICD-10-CM | POA: Diagnosis not present

## 2023-11-14 LAB — HM MAMMOGRAPHY

## 2024-01-20 NOTE — Progress Notes (Unsigned)
 [facility] [address] [place] [address] Phone: [phone] Subjective:   Cassie [name], am serving as a scribe for Dr. Arthea Claudene.  I'm seeing this patient by the request  of:  Early, Camie BRAVO, NP  CC: Low back and hip pain  YEP:Dlagzrupcz  Cassie [name] is a 53 y.o. female coming in with complaint of lower back and hip pain.  Has been seen previously for sacroiliac dysfunction.  Patient states couldn't get comfortable over the weekend. Discomfort over SI, but some burning in  L calf and some pain into hip and thigh. Has gotten better. Last MSK in 02/2022.   Low back x-rays in 2023 were unremarkable.    Past Medical History:  Diagnosis Date   Back pain    Eating disorder 05/22/2022   GERD (gastroesophageal reflux disease)    with certain foods/certain times of day   Infertility, female    Joint pain    Menorrhagia with regular cycle 09/25/2016   Murmur, cardiac 11/15/2022   Obesity, Class II, BMI 35-39.9 04/02/2015   Plantar fascia syndrome    Pre-diabetes    Somatic dysfunction of spine, sacral 02/15/2022   Thyroid  disease    thyroid  nodules-bx   Past Surgical History:  Procedure Laterality Date   BREAST BIOPSY Left 08/2017   HYSTEROSCOPY  2012   ivf   2012   KNEE ARTHROSCOPY Right 2006   Social History   Socioeconomic History   Marital status: Married    Spouse name: Oneil Pesa   Number of children: 1   Years of education: Not on file   Highest education level: Professional school degree (e.g., MD, DDS, DVM, JD)  Occupational History   Occupation: MD, OB/GYN  Tobacco Use   Smoking status: Never    Passive exposure: Never   Smokeless tobacco: Never  Substance and Sexual Activity   Alcohol use: No   Drug use: No   Sexual activity: Yes    Partners: Male    Birth control/protection: Condom  Other Topics Concern   Not on file  Social History Narrative   Not on file   Social Drivers of Health    Financial Resource Strain: Low Risk  (11/05/2023)   Overall Financial Resource Strain (CARDIA)    Difficulty of Paying Living Expenses: Not hard at all  Food Insecurity: No Food Insecurity (11/05/2023)   Hunger Vital Sign    Worried About Running Out of Food in the Last Year: Never true    Ran Out of Food in the Last Year: Never true  Transportation Needs: No Transportation Needs (11/05/2023)   PRAPARE - Administrator, Civil Service (Medical): No    Lack of Transportation (Non-Medical): No  Physical Activity: Insufficiently Active (11/05/2023)   Exercise Vital Sign    Days of Exercise per Week: 2 days    Minutes of Exercise per Session: 30 min  Stress: Stress Concern Present (11/05/2023)   [employer] of Occupational Health - Occupational Stress Questionnaire    Feeling of Stress: Rather much  Social Connections: Socially Integrated (11/05/2023)   Social Connection and Isolation Panel    Frequency of Communication with Friends and Family: Three times a week    Frequency of Social Gatherings with Friends and Family: Once a week    Attends Religious Services: More than 4 times per year    Active Member of [employer] or Organizations: Yes    Attends Banker Meetings: More  than 4 times per year    Marital Status: Married   No Known Allergies Family History  Problem Relation Age of Onset   Cancer Mother    High Cholesterol Mother    Diabetes Father    Hypertension Father    Cancer Father    Kidney disease Father    Cancer Maternal Aunt    Colon cancer Neg Hx    Colon polyps Neg Hx    Esophageal cancer Neg Hx    Rectal cancer Neg Hx    Stomach cancer Neg Hx          Current Outpatient Medications (Other):    acetic acid  2 % otic solution, Place 4 drops into both ears 3 (three) times daily.   scopolamine  (TRANSDERM-SCOP) 1 MG/3DAYS, Place 1 patch (1.5 mg total) onto the skin every 3 (three) days. (Patient not taking: Reported on  11/06/2023)   Reviewed prior external information including notes and imaging from  primary care provider As well as notes that were available from care everywhere and other healthcare systems.  Past medical history, social, surgical and family history all reviewed in electronic medical record.  No pertanent information unless stated regarding to the chief complaint.   Review of Systems:  No headache, visual changes, nausea, vomiting, diarrhea, constipation, dizziness, abdominal pain, skin rash, fevers, chills, night sweats, weight loss, swollen lymph nodes, body aches, joint swelling, chest pain, shortness of breath, mood changes. POSITIVE muscle aches  Objective  There were no vitals taken for this visit.   General: No apparent distress alert and oriented x3 mood and affect normal, dressed appropriately.  HEENT: Pupils equal, extraocular movements intact  Respiratory: Patient's speak in full sentences and does not appear short of breath  Cardiovascular: No lower extremity edema, non tender, no erythema  Low back exam shows severe tenderness to palpation over the sacroiliac joint on the left side.  Positive FABER test.  Negative straight leg test noted.  Some mild tenderness in the paraspinal musculature on the left side.  Osteopathic findings T9 extended rotated and side bent left L3 flexed rotated and side bent right L5 flexed rotated and side bent left Sacrum left on left     Impression and Recommendations:  SI (sacroiliac) joint dysfunction Appears to be in an exacerbation of an underlying condition.  We discussed with patient about hip abductor strengthening again and given exercises.  Responded extremely well though to osteopathic manipulation.  Discussed icing regimen, increase activity slowly.  Given medications including prednisone  with patient traveling [place].  I do believe patient will do well.  Follow-up with me again in 6 to 8 weeks otherwise.    Decision today to  treat with OMT was based on Physical Exam  After verbal consent patient was treated with HVLA, ME, FPR techniques in thoracic,  lumbar and sacral areas, all areas are chronic   Patient tolerated the procedure well with improvement in symptoms  Patient given exercises, stretches and lifestyle modifications  See medications in patient instructions if given  Patient will follow up in 8 weeks   The above documentation has been reviewed and is accurate and complete Alysse Rathe M Nelle Sayed, DO

## 2024-01-22 ENCOUNTER — Ambulatory Visit: Admitting: Family Medicine

## 2024-01-22 ENCOUNTER — Other Ambulatory Visit (HOSPITAL_BASED_OUTPATIENT_CLINIC_OR_DEPARTMENT_OTHER): Payer: Self-pay

## 2024-01-22 ENCOUNTER — Encounter: Payer: Self-pay | Admitting: Family Medicine

## 2024-01-22 VITALS — BP 122/76 | HR 85 | Ht 70.0 in | Wt 242.0 lb

## 2024-01-22 DIAGNOSIS — M9903 Segmental and somatic dysfunction of lumbar region: Secondary | ICD-10-CM

## 2024-01-22 DIAGNOSIS — M9902 Segmental and somatic dysfunction of thoracic region: Secondary | ICD-10-CM | POA: Diagnosis not present

## 2024-01-22 DIAGNOSIS — M9904 Segmental and somatic dysfunction of sacral region: Secondary | ICD-10-CM

## 2024-01-22 DIAGNOSIS — M533 Sacrococcygeal disorders, not elsewhere classified: Secondary | ICD-10-CM | POA: Diagnosis not present

## 2024-01-22 MED ORDER — GABAPENTIN 100 MG PO CAPS
200.0000 mg | ORAL_CAPSULE | Freq: Every day | ORAL | 0 refills | Status: AC
  Filled 2024-01-22: qty 180, 90d supply, fill #0

## 2024-01-22 MED ORDER — PREDNISONE 20 MG PO TABS
40.0000 mg | ORAL_TABLET | Freq: Every day | ORAL | 0 refills | Status: DC
  Filled 2024-01-22: qty 10, 5d supply, fill #0

## 2024-01-22 NOTE — Assessment & Plan Note (Signed)
 Appears to be in an exacerbation of an underlying condition.  We discussed with patient about hip abductor strengthening again and given exercises.  Responded extremely well though to osteopathic manipulation.  Discussed icing regimen, increase activity slowly.  Given medications including prednisone  with patient traveling [place].  I do believe patient will do well.  Follow-up with me again in 6 to 8 weeks otherwise.

## 2024-01-22 NOTE — Patient Instructions (Addendum)
 Prednisone  prescribed for trip Gabapentin 200mg  prescribed Do prescribed exercises at least 3x a week See you again in 6-8 weeks

## 2024-02-26 ENCOUNTER — Telehealth: Payer: Self-pay | Admitting: Nurse Practitioner

## 2024-02-26 ENCOUNTER — Other Ambulatory Visit (HOSPITAL_COMMUNITY): Payer: Self-pay

## 2024-02-26 DIAGNOSIS — J101 Influenza due to other identified influenza virus with other respiratory manifestations: Secondary | ICD-10-CM

## 2024-02-26 MED ORDER — OSELTAMIVIR PHOSPHATE 75 MG PO CAPS
75.0000 mg | ORAL_CAPSULE | Freq: Two times a day (BID) | ORAL | 0 refills | Status: AC
  Filled 2024-02-26: qty 10, 5d supply, fill #0

## 2024-02-26 MED ORDER — IBUPROFEN 800 MG PO TABS
800.0000 mg | ORAL_TABLET | Freq: Three times a day (TID) | ORAL | 0 refills | Status: AC | PRN
  Filled 2024-02-26: qty 60, 20d supply, fill #0

## 2024-02-26 MED ORDER — BENZONATATE 200 MG PO CAPS
200.0000 mg | ORAL_CAPSULE | Freq: Three times a day (TID) | ORAL | 1 refills | Status: AC | PRN
  Filled 2024-02-26: qty 30, 10d supply, fill #0

## 2024-02-26 NOTE — Telephone Encounter (Signed)
 Patient contacted me this morning with concerns of sudden onset of upper respiratory symptoms overnight and positive influenza A testing at home. COVID was negative. Patient reports her symptoms are consistent with past influenza symptoms. She has responded well to tamiflu in the past. Will send Tamiflu, tessalon  perles, and ibuprofen  to pharmacy. Patient aware of emergency s/s that would warrant further evaluation.

## 2024-02-27 ENCOUNTER — Ambulatory Visit (HOSPITAL_BASED_OUTPATIENT_CLINIC_OR_DEPARTMENT_OTHER): Admitting: Cardiology

## 2024-02-29 ENCOUNTER — Ambulatory Visit (HOSPITAL_COMMUNITY)
Admission: EM | Admit: 2024-02-29 | Discharge: 2024-02-29 | Disposition: A | Discharge status: Home / Self Care | Source: Home / Self Care

## 2024-02-29 ENCOUNTER — Encounter (HOSPITAL_COMMUNITY): Payer: Self-pay

## 2024-02-29 ENCOUNTER — Ambulatory Visit (HOSPITAL_COMMUNITY)

## 2024-02-29 DIAGNOSIS — J09X2 Influenza due to identified novel influenza A virus with other respiratory manifestations: Secondary | ICD-10-CM | POA: Diagnosis not present

## 2024-02-29 DIAGNOSIS — J9811 Atelectasis: Secondary | ICD-10-CM | POA: Diagnosis not present

## 2024-02-29 DIAGNOSIS — R051 Acute cough: Secondary | ICD-10-CM | POA: Diagnosis not present

## 2024-02-29 DIAGNOSIS — R918 Other nonspecific abnormal finding of lung field: Secondary | ICD-10-CM | POA: Diagnosis not present

## 2024-02-29 DIAGNOSIS — R059 Cough, unspecified: Secondary | ICD-10-CM | POA: Diagnosis not present

## 2024-02-29 MED ORDER — ALBUTEROL SULFATE HFA 108 (90 BASE) MCG/ACT IN AERS
2.0000 | INHALATION_SPRAY | RESPIRATORY_TRACT | Status: DC
  Administered 2024-02-29: 2 via RESPIRATORY_TRACT

## 2024-02-29 MED ORDER — PREDNISONE 50 MG PO TABS: ORAL_TABLET | ORAL | 0 refills | Status: DC

## 2024-02-29 MED ORDER — AZITHROMYCIN 250 MG PO TABS: ORAL_TABLET | ORAL | 0 refills | Status: DC

## 2024-02-29 MED ORDER — ALBUTEROL SULFATE HFA 108 (90 BASE) MCG/ACT IN AERS
INHALATION_SPRAY | RESPIRATORY_TRACT | Status: AC
  Filled 2024-02-29: qty 6.7

## 2024-02-29 NOTE — Discharge Instructions (Addendum)
 Return if any problems.

## 2024-02-29 NOTE — ED Provider Notes (Signed)
 [facility]    CSN: [id number] Arrival date & time: 02/29/24  9196      History   Chief Complaint Chief Complaint  Patient presents with   Cough    HPI Cassie [name] is a 53 y.o. female.   Patient complains of cough and congestion.  Patient has had 2 positive flu test.  Patient is positive for influenza A.  She reports difficulty sleeping last p.m. due to cough.  Patient has been using her albuterol  inhaler with some relief but has shaking and feeling jittery after using it.  Patient has Tessalon  Perles at home.  She has taken prednisone  in the past without difficulty.  The history is provided by the patient. No language interpreter was used.  Cough   Past Medical History:  Diagnosis Date   Back pain    Eating disorder 05/22/2022   GERD (gastroesophageal reflux disease)    with certain foods/certain times of day   Infertility, female    Joint pain    Menorrhagia with regular cycle 09/25/2016   Murmur, cardiac 11/15/2022   Obesity, Class II, BMI 35-39.9 04/02/2015   Plantar fascia syndrome    Pre-diabetes    Somatic dysfunction of spine, sacral 02/15/2022   Thyroid  disease    thyroid  nodules-bx    Patient Active Problem List   Diagnosis Date Noted   Grade II diastolic dysfunction 11/06/2023   Tinnitus of both ears 11/06/2023   Eczematoid otitis externa of both ears 11/06/2023   Murmur, cardiac 11/15/2022   Moderate mixed hyperlipidemia not requiring statin therapy 11/12/2022   Vitamin B 12 deficiency 06/05/2022   Class 1 obesity with serious comorbidity and body mass index (BMI) of 34.0 to 34.9 in adult 05/22/2022   Prediabetes 05/22/2022   SI (sacroiliac) joint dysfunction 02/15/2022   Shortness, tendon, Achilles acquired, left 07/09/2021   Encounter for annual physical exam 08/04/2020   Hemorrhoids 08/04/2020   Anxiety 09/23/2018   Thyroid  nodule 01/24/2016   Vitamin D  deficiency 01/24/2016    Past Surgical History:  Procedure  Laterality Date   BREAST BIOPSY Left 08/2017   HYSTEROSCOPY  2012   ivf   2012   KNEE ARTHROSCOPY Right 2006    OB History     Gravida  1   Para  1   Term  1   Preterm      AB  0   Living  1      SAB  0   IAB      Ectopic      Multiple      Live Births  1            Home Medications    Prior to Admission medications  Medication Sig Start Date End Date Taking? Authorizing Provider  azithromycin  (ZITHROMAX  Z-PAK) 250 MG tablet Take 2 tablets (500 mg) on  Day 1,  followed by 1 tablet (250 mg) once daily on Days 2 through 5. 02/29/24 03/05/24 Yes Antrell Tipler K, PA-C  predniSONE  (DELTASONE ) 50 MG tablet One tablet a day 02/29/24  Yes Hazleigh Mccleave K, PA-C  benzonatate  (TESSALON ) 200 MG capsule Take 1 capsule (200 mg total) by mouth 3 (three) times daily as needed for cough. 02/26/24   Early, Sara E, NP  gabapentin  (NEURONTIN ) 100 MG capsule Take 2 capsules (200 mg total) by mouth at bedtime. Patient not taking: Reported on 02/29/2024 01/22/24   Claudene Arthea HERO, DO  ibuprofen  (ADVIL ) 800 MG tablet Take 1 tablet (800  mg total) by mouth every 8 (eight) hours as needed for fever or headache. 02/26/24   Early, Sara E, NP  oseltamivir  (TAMIFLU ) 75 MG capsule Take 1 capsule (75 mg total) by mouth 2 (two) times daily. Patient not taking: Reported on 02/29/2024 02/26/24   Early, Camie BRAVO, NP    Family History Family History  Problem Relation Age of Onset   Cancer Mother    High Cholesterol Mother    Diabetes Father    Hypertension Father    Cancer Father    Kidney disease Father    Cancer Maternal Aunt    Colon cancer Neg Hx    Colon polyps Neg Hx    Esophageal cancer Neg Hx    Rectal cancer Neg Hx    Stomach cancer Neg Hx     Social History Social History[1]   Allergies   Patient has no known allergies.   Review of Systems Review of Systems  Respiratory:  Positive for cough.   All other systems reviewed and are negative.    Physical Exam Triage  Vital Signs ED Triage Vitals [02/29/24 0819]  Encounter Vitals Group     BP 128/84     Girls Systolic BP Percentile      Girls Diastolic BP Percentile      Boys Systolic BP Percentile      Boys Diastolic BP Percentile      Pulse Rate 72     Resp 16     Temp 98.3 F (36.8 C)     Temp Source Oral     SpO2 93 %     Weight      Height      Head Circumference      Peak Flow      Pain Score 0     Pain Loc      Pain Education      Exclude from Growth Chart    No data found.  Updated Vital Signs BP 128/84 (BP Location: Right Arm)   Pulse 72   Temp 98.3 F (36.8 C) (Oral)   Resp 16   SpO2 93%   Visual Acuity Right Eye Distance:   Left Eye Distance:   Bilateral Distance:    Right Eye Near:   Left Eye Near:    Bilateral Near:     Physical Exam Vitals and nursing note reviewed.  Constitutional:      Appearance: She is well-developed.  HENT:     Head: Normocephalic.  Cardiovascular:     Rate and Rhythm: Normal rate.  Pulmonary:     Effort: Pulmonary effort is normal.     Breath sounds: Wheezing and rhonchi present.  Abdominal:     General: There is no distension.  Musculoskeletal:        General: Normal range of motion.  Skin:    General: Skin is warm.  Neurological:     General: No focal deficit present.     Mental Status: She is alert and oriented to person, place, and time.      UC Treatments / Results  Labs (all labs ordered are listed, but only abnormal results are displayed) Labs Reviewed - No data to display  EKG   Radiology DG Chest 2 View Result Date: 02/29/2024 CLINICAL DATA:  Cough. EXAM: CHEST - 2 VIEW COMPARISON:  09/19/2023 FINDINGS: Right lung clear. Streaky density at the left base is probably atelectatic. No pleural effusion. The cardiopericardial silhouette is within normal limits for size. No acute  bony abnormality. IMPRESSION: Streaky left basilar opacity, likely atelectasis. Electronically Signed   By: Camellia Candle M.D.   On:  02/29/2024 09:06    Procedures Procedures (including critical care time)  Medications Ordered in UC Medications  albuterol  (VENTOLIN  HFA) 108 (90 Base) MCG/ACT inhaler 2 puff (2 puffs Inhalation Given 02/29/24 0933)    Initial Impression / Assessment and Plan / UC Course  I have reviewed the triage vital signs and the nursing notes.  Pertinent labs & imaging results that were available during my care of the patient were reviewed by me and considered in my medical decision making (see chart for details).     Medical decision making patient has wheezing and rhonchi on lung exam.  Patient has an albuterol  inhaler but does not use an AeroChamber.  I will give her an AeroChamber and an albuterol  inhaler here in order to instruct her on use of AeroChamber.  Chest x-ray shows left sided atelectasis.  This could be secondary to influenza or possibly early pneumonia.  Patient is given a prescription for prednisone  and Zithromax .  Patient is advised to continue Tessalon  Perles.  Patient advised to wash her mouth out after using albuterol  as this may help with decreasing some of the shaky sensation.  Patient discharged in stable condition Final Clinical Impressions(s) / UC Diagnoses   Final diagnoses:  Acute cough  Influenza due to identified novel influenza A virus with other respiratory manifestations  Atelectasis     Discharge Instructions      Return if any problems.     ED Prescriptions     Medication Sig Dispense Auth. Provider   predniSONE  (DELTASONE ) 50 MG tablet One tablet a day 5 tablet Calyb Mcquarrie K, PA-C   azithromycin  (ZITHROMAX  Z-PAK) 250 MG tablet Take 2 tablets (500 mg) on  Day 1,  followed by 1 tablet (250 mg) once daily on Days 2 through 5. 6 each Jomar Denz K, PA-C      PDMP not reviewed this encounter. An After Visit Summary was printed and given to the patient.        [1]  Social History Tobacco Use   Smoking status: Never    Passive exposure:  Never   Smokeless tobacco: Never  Vaping Use   Vaping status: Never Used  Substance Use Topics   Alcohol use: No   Drug use: No     Flint Sonny POUR, PA-C 02/29/24 1002

## 2024-02-29 NOTE — ED Triage Notes (Signed)
 Patient reports that she tested positive for Flu A twice last week, but continues to have a cough and chest congestion that is worse at night.  Patient states she has been taking Ibuprofen , Mucinex and using her Albuterol  inhaler.

## 2024-03-03 NOTE — Progress Notes (Signed)
°  Cardiology Office Note:  .   Date:  03/03/2024  ID:  Cassie [name], DOB [birth year], MRN [id number] PCP: Cassie Camie BRAVO, NP  [facility] Providers Cardiologist:  None {  History of Present Illness: Cassie [name]   Cassie [name] is a 53 y.o. female ***  Pertinent CV history:  ROS: Denies chest pain, shortness of breath at rest or with normal exertion. No PND, orthopnea, LE edema or unexpected weight gain. No syncope or palpitations. ROS otherwise negative except as noted.   Studies Reviewed: Cassie [name]    EKG:       Physical Exam:   VS:  There were no vitals taken for this visit.   Wt Readings from Last 3 Encounters:  01/22/24 242 lb (109.8 kg)  11/06/23 238 lb 3.2 oz (108 kg)  08/26/22 225 lb (102.1 kg)    GEN: Well nourished, well developed in no acute distress HEENT: Normal, moist mucous membranes NECK: No JVD CARDIAC: regular rhythm, normal S1 and S2, no rubs or gallops. No murmur. VASCULAR: Radial and DP pulses 2+ bilaterally. No carotid bruits RESPIRATORY:  Clear to auscultation without rales, wheezing or rhonchi  ABDOMEN: Soft, non-tender, non-distended MUSCULOSKELETAL:  Ambulates independently SKIN: Warm and dry, no edema NEUROLOGIC:  Alert and oriented x 3. No focal neuro deficits noted. PSYCHIATRIC:  Normal affect    ASSESSMENT AND PLAN: .     CV risk counseling and prevention -recommend heart healthy/Mediterranean diet, with whole grains, fruits, vegetable, fish, lean meats, nuts, and olive oil. Limit salt. -recommend moderate walking, 3-5 times/week for 30-50 minutes each session. Aim for at least 150 minutes/week. Goal should be pace of 3 miles/hours, or walking 1.5 miles in 30 minutes -recommend avoidance of tobacco products. Avoid excess alcohol. -ASCVD risk score: The 10-year ASCVD risk score (Arnett DK, et al., 2019) is: 1.1%   Values used to calculate the score:     Age: 45 years     Clinically relevant sex: Female     Is Non-Hispanic African  American: No     Diabetic: No     Tobacco smoker: No     Systolic Blood Pressure: 128 mmHg     Is BP treated: No     HDL Cholesterol: 56 mg/dL     Total Cholesterol: 169 mg/dL    Dispo: ***  Signed, Shelda Bruckner, MD   Shelda Bruckner, MD, PhD, [facility]  [facility]  [place]  [facility] at [facility] [address], [address] [place], [place] [address] [phone]

## 2024-03-05 ENCOUNTER — Ambulatory Visit (HOSPITAL_BASED_OUTPATIENT_CLINIC_OR_DEPARTMENT_OTHER): Admitting: Cardiology

## 2024-03-05 ENCOUNTER — Encounter (HOSPITAL_BASED_OUTPATIENT_CLINIC_OR_DEPARTMENT_OTHER): Payer: Self-pay | Admitting: Cardiology

## 2024-03-05 ENCOUNTER — Other Ambulatory Visit (HOSPITAL_COMMUNITY): Payer: Self-pay

## 2024-03-05 VITALS — BP 108/62 | HR 82 | Ht 70.5 in | Wt 239.7 lb

## 2024-03-05 DIAGNOSIS — Z6833 Body mass index (BMI) 33.0-33.9, adult: Secondary | ICD-10-CM

## 2024-03-05 DIAGNOSIS — E66811 Obesity, class 1: Secondary | ICD-10-CM

## 2024-03-05 DIAGNOSIS — R931 Abnormal findings on diagnostic imaging of heart and coronary circulation: Secondary | ICD-10-CM | POA: Diagnosis not present

## 2024-03-05 DIAGNOSIS — E6609 Other obesity due to excess calories: Secondary | ICD-10-CM | POA: Diagnosis not present

## 2024-03-05 DIAGNOSIS — Z712 Person consulting for explanation of examination or test findings: Secondary | ICD-10-CM | POA: Diagnosis not present

## 2024-03-05 DIAGNOSIS — Z7189 Other specified counseling: Secondary | ICD-10-CM | POA: Diagnosis not present

## 2024-03-05 NOTE — Patient Instructions (Signed)
Medication Instructions:  No changes *If you need a refill on your cardiac medications before your next appointment, please call your pharmacy*   Lab Work: none   Testing/Procedures: none   Follow-Up: As needed   

## 2024-03-19 ENCOUNTER — Encounter: Payer: Self-pay | Admitting: Family Medicine

## 2024-03-19 MED ORDER — AMOXICILLIN-POT CLAVULANATE 875-125 MG PO TABS: 1.0000 | ORAL_TABLET | Freq: Two times a day (BID) | ORAL | 0 refills | Status: AC

## 2024-03-24 NOTE — Progress Notes (Unsigned)
" °  [facility] [address] [place] [address] Phone: [phone] Subjective:    I'm seeing this patient by the request  of:  Early, Camie BRAVO, NP  CC: Back and neck pain  YEP:Dlagzrupcz  Cassie [name] is a 54 y.o. female coming in with complaint of back and neck pain. OMT 01/22/2024. Patient states   Medications patient has been prescribed: Gabapentin   Taking:         Reviewed prior external information including notes and imaging from previsou exam, outside providers and external EMR if available.   As well as notes that were available from care everywhere and other healthcare systems.  Past medical history, social, surgical and family history all reviewed in electronic medical record.  No pertanent information unless stated regarding to the chief complaint.   Past Medical History:  Diagnosis Date   Back pain    Eating disorder 05/22/2022   GERD (gastroesophageal reflux disease)    with certain foods/certain times of day   Infertility, female    Joint pain    Menorrhagia with regular cycle 09/25/2016   Murmur, cardiac 11/15/2022   Obesity, Class II, BMI 35-39.9 04/02/2015   Plantar fascia syndrome    Pre-diabetes    Somatic dysfunction of spine, sacral 02/15/2022   Thyroid  disease    thyroid  nodules-bx    Allergies[1]   Review of Systems:  No headache, visual changes, nausea, vomiting, diarrhea, constipation, dizziness, abdominal pain, skin rash, fevers, chills, night sweats, weight loss, swollen lymph nodes, body aches, joint swelling, chest pain, shortness of breath, mood changes. POSITIVE muscle aches  Objective  There were no vitals taken for this visit.   General: No apparent distress alert and oriented x3 mood and affect normal, dressed appropriately.  HEENT: Pupils equal, extraocular movements intact  Respiratory: Patient's speak in full sentences and does not appear short of breath  Cardiovascular: No lower  extremity edema, non tender, no erythema  Gait MSK:  Back   Osteopathic findings  T3 extended rotated and side bent right inhaled rib T9 extended rotated and side bent left L2 flexed rotated and side bent right Sacrum right on right       Assessment and Plan:  No problem-specific Assessment & Plan notes found for this encounter.    Nonallopathic problems  Decision today to treat with OMT was based on Physical Exam  After verbal consent patient was treated with HVLA, ME, FPR techniques in  rib, thoracic, lumbar, and sacral  areas  Patient tolerated the procedure well with improvement in symptoms  Patient given exercises, stretches and lifestyle modifications  See medications in patient instructions if given  Patient will follow up in 4-8 weeks     The above documentation has been reviewed and is accurate and complete Cassie Charlie M Shannon Kirkendall, DO         Note: This dictation was prepared with Dragon dictation along with smaller phrase technology. Any transcriptional errors that result from this process are unintentional.            [1] No Known Allergies  "

## 2024-03-25 ENCOUNTER — Ambulatory Visit: Admitting: Family Medicine

## 2024-04-09 ENCOUNTER — Other Ambulatory Visit (HOSPITAL_BASED_OUTPATIENT_CLINIC_OR_DEPARTMENT_OTHER): Payer: Self-pay

## 2024-04-09 ENCOUNTER — Other Ambulatory Visit: Payer: Self-pay | Admitting: Obstetrics and Gynecology

## 2024-04-09 ENCOUNTER — Other Ambulatory Visit (HOSPITAL_COMMUNITY)
Admission: RE | Admit: 2024-04-09 | Discharge: 2024-04-09 | Disposition: A | Source: Ambulatory Visit | Attending: Obstetrics and Gynecology | Admitting: Obstetrics and Gynecology

## 2024-04-09 DIAGNOSIS — N760 Acute vaginitis: Secondary | ICD-10-CM

## 2024-04-09 LAB — CERVICOVAGINAL ANCILLARY ONLY
Bacterial Vaginitis (gardnerella): NEGATIVE
Candida Glabrata: NEGATIVE
Candida Vaginitis: NEGATIVE
Comment: NEGATIVE
Comment: NEGATIVE
Comment: NEGATIVE

## 2024-04-09 MED ORDER — FLUCONAZOLE 150 MG PO TABS
150.0000 mg | ORAL_TABLET | Freq: Once | ORAL | 0 refills | Status: AC
  Filled 2024-04-09: qty 2, 3d supply, fill #0

## 2024-11-18 ENCOUNTER — Encounter: Payer: Self-pay | Admitting: Nurse Practitioner
